# Patient Record
Sex: Female | Born: 1950 | Race: White | Hispanic: No | Smoking: Never smoker
Health system: Southern US, Community
[De-identification: ages and names within clinical notes are randomized; demographics above are authoritative.]

## PROBLEM LIST (undated history)

## (undated) DIAGNOSIS — I6350 Cerebral infarction due to unspecified occlusion or stenosis of unspecified cerebral artery: Secondary | ICD-10-CM

## (undated) DIAGNOSIS — H4920 Sixth [abducent] nerve palsy, unspecified eye: Secondary | ICD-10-CM

## (undated) DIAGNOSIS — F419 Anxiety disorder, unspecified: Secondary | ICD-10-CM

## (undated) DIAGNOSIS — K219 Gastro-esophageal reflux disease without esophagitis: Secondary | ICD-10-CM

## (undated) DIAGNOSIS — M1990 Unspecified osteoarthritis, unspecified site: Secondary | ICD-10-CM

## (undated) DIAGNOSIS — H409 Unspecified glaucoma: Secondary | ICD-10-CM

## (undated) DIAGNOSIS — IMO0002 Reserved for concepts with insufficient information to code with codable children: Secondary | ICD-10-CM

## (undated) DIAGNOSIS — Z8619 Personal history of other infectious and parasitic diseases: Secondary | ICD-10-CM

## (undated) DIAGNOSIS — I10 Essential (primary) hypertension: Secondary | ICD-10-CM

## (undated) DIAGNOSIS — F329 Major depressive disorder, single episode, unspecified: Secondary | ICD-10-CM

## (undated) DIAGNOSIS — F32A Depression, unspecified: Secondary | ICD-10-CM

## (undated) HISTORY — DX: Unspecified glaucoma: H40.9

## (undated) HISTORY — PX: WISDOM TOOTH EXTRACTION: SHX21

## (undated) HISTORY — PX: CHOLECYSTECTOMY: SHX55

## (undated) HISTORY — DX: Anxiety disorder, unspecified: F41.9

## (undated) HISTORY — PX: JOINT REPLACEMENT: SHX530

## (undated) HISTORY — PX: ABDOMINAL HYSTERECTOMY: SHX81

## (undated) HISTORY — PX: COLONOSCOPY: SHX174

---
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
## 1997-06-10 DIAGNOSIS — H4920 Sixth [abducent] nerve palsy, unspecified eye: Secondary | ICD-10-CM

## 1997-06-10 HISTORY — DX: Sixth (abducent) nerve palsy, unspecified eye: H49.20

## 2001-10-03 DIAGNOSIS — I639 Cerebral infarction, unspecified: Secondary | ICD-10-CM

## 2001-10-03 HISTORY — DX: Cerebral infarction, unspecified: I63.9

## 2004-10-06 ENCOUNTER — Ambulatory Visit: Payer: Self-pay | Admitting: Internal Medicine

## 2005-10-09 ENCOUNTER — Ambulatory Visit: Payer: Self-pay | Admitting: Internal Medicine

## 2005-10-11 ENCOUNTER — Ambulatory Visit: Payer: Self-pay | Admitting: Internal Medicine

## 2005-10-23 ENCOUNTER — Ambulatory Visit: Payer: Self-pay | Admitting: Gastroenterology

## 2005-11-03 HISTORY — PX: BREAST BIOPSY: SHX20

## 2006-07-12 ENCOUNTER — Ambulatory Visit: Payer: Self-pay | Admitting: Internal Medicine

## 2006-10-10 ENCOUNTER — Ambulatory Visit: Payer: Self-pay | Admitting: General Surgery

## 2007-02-25 ENCOUNTER — Ambulatory Visit: Payer: Self-pay | Admitting: Internal Medicine

## 2007-10-10 ENCOUNTER — Ambulatory Visit: Payer: Self-pay | Admitting: General Surgery

## 2007-10-21 ENCOUNTER — Ambulatory Visit: Payer: Self-pay | Admitting: Gastroenterology

## 2008-02-04 ENCOUNTER — Ambulatory Visit: Payer: Self-pay | Admitting: Internal Medicine

## 2008-02-12 ENCOUNTER — Ambulatory Visit: Payer: Self-pay | Admitting: Internal Medicine

## 2008-02-21 HISTORY — PX: KNEE ARTHROSCOPY: SUR90

## 2008-04-20 ENCOUNTER — Ambulatory Visit: Payer: Self-pay | Admitting: Urology

## 2008-11-02 ENCOUNTER — Ambulatory Visit: Payer: Self-pay | Admitting: Internal Medicine

## 2009-12-20 ENCOUNTER — Ambulatory Visit: Payer: Self-pay | Admitting: Internal Medicine

## 2011-02-13 ENCOUNTER — Ambulatory Visit: Payer: Self-pay

## 2012-05-08 ENCOUNTER — Ambulatory Visit: Payer: Self-pay | Admitting: Internal Medicine

## 2012-08-28 ENCOUNTER — Ambulatory Visit: Payer: Self-pay

## 2013-09-08 ENCOUNTER — Ambulatory Visit: Payer: Self-pay

## 2013-09-16 ENCOUNTER — Ambulatory Visit: Payer: Self-pay

## 2014-07-29 DIAGNOSIS — K649 Unspecified hemorrhoids: Secondary | ICD-10-CM | POA: Insufficient documentation

## 2014-07-29 DIAGNOSIS — F419 Anxiety disorder, unspecified: Secondary | ICD-10-CM | POA: Insufficient documentation

## 2014-07-31 ENCOUNTER — Ambulatory Visit: Payer: Self-pay | Admitting: Internal Medicine

## 2014-09-28 DIAGNOSIS — F5104 Psychophysiologic insomnia: Secondary | ICD-10-CM | POA: Insufficient documentation

## 2015-01-26 ENCOUNTER — Other Ambulatory Visit: Payer: Self-pay | Admitting: Internal Medicine

## 2015-01-26 DIAGNOSIS — Z1239 Encounter for other screening for malignant neoplasm of breast: Secondary | ICD-10-CM

## 2015-02-01 ENCOUNTER — Ambulatory Visit
Admission: RE | Admit: 2015-02-01 | Discharge: 2015-02-01 | Disposition: A | Discharge status: Home / Self Care | Payer: PPO | Source: Ambulatory Visit | Attending: Internal Medicine | Admitting: Internal Medicine

## 2015-02-01 DIAGNOSIS — Z1231 Encounter for screening mammogram for malignant neoplasm of breast: Secondary | ICD-10-CM | POA: Diagnosis not present

## 2015-02-01 DIAGNOSIS — Z1239 Encounter for other screening for malignant neoplasm of breast: Secondary | ICD-10-CM

## 2015-03-01 DIAGNOSIS — R0609 Other forms of dyspnea: Secondary | ICD-10-CM | POA: Insufficient documentation

## 2015-06-22 DIAGNOSIS — H401131 Primary open-angle glaucoma, bilateral, mild stage: Secondary | ICD-10-CM | POA: Diagnosis not present

## 2015-09-13 DIAGNOSIS — R1032 Left lower quadrant pain: Secondary | ICD-10-CM | POA: Diagnosis not present

## 2015-09-13 DIAGNOSIS — Z86010 Personal history of colonic polyps: Secondary | ICD-10-CM | POA: Diagnosis not present

## 2015-10-18 DIAGNOSIS — H40153 Residual stage of open-angle glaucoma, bilateral: Secondary | ICD-10-CM | POA: Diagnosis not present

## 2015-10-29 DIAGNOSIS — R05 Cough: Secondary | ICD-10-CM | POA: Diagnosis not present

## 2015-11-04 ENCOUNTER — Encounter: Payer: Self-pay | Admitting: *Deleted

## 2015-11-05 ENCOUNTER — Encounter
Admission: RE | Disposition: A | Discharge status: Home / Self Care | Payer: Self-pay | Source: Ambulatory Visit | Attending: Gastroenterology

## 2015-11-05 ENCOUNTER — Ambulatory Visit: Payer: PPO | Admitting: Anesthesiology

## 2015-11-05 ENCOUNTER — Ambulatory Visit
Admission: RE | Admit: 2015-11-05 | Discharge: 2015-11-05 | Disposition: A | Discharge status: Home / Self Care | Payer: PPO | Source: Ambulatory Visit | Attending: Gastroenterology | Admitting: Gastroenterology

## 2015-11-05 DIAGNOSIS — K621 Rectal polyp: Secondary | ICD-10-CM | POA: Diagnosis not present

## 2015-11-05 DIAGNOSIS — Z86010 Personal history of colonic polyps: Secondary | ICD-10-CM | POA: Diagnosis not present

## 2015-11-05 DIAGNOSIS — K5730 Diverticulosis of large intestine without perforation or abscess without bleeding: Secondary | ICD-10-CM | POA: Diagnosis not present

## 2015-11-05 DIAGNOSIS — F419 Anxiety disorder, unspecified: Secondary | ICD-10-CM | POA: Diagnosis not present

## 2015-11-05 DIAGNOSIS — D122 Benign neoplasm of ascending colon: Secondary | ICD-10-CM | POA: Diagnosis not present

## 2015-11-05 DIAGNOSIS — D123 Benign neoplasm of transverse colon: Secondary | ICD-10-CM | POA: Insufficient documentation

## 2015-11-05 DIAGNOSIS — D125 Benign neoplasm of sigmoid colon: Secondary | ICD-10-CM | POA: Diagnosis not present

## 2015-11-05 DIAGNOSIS — K635 Polyp of colon: Secondary | ICD-10-CM | POA: Diagnosis not present

## 2015-11-05 DIAGNOSIS — I10 Essential (primary) hypertension: Secondary | ICD-10-CM | POA: Insufficient documentation

## 2015-11-05 DIAGNOSIS — F329 Major depressive disorder, single episode, unspecified: Secondary | ICD-10-CM | POA: Insufficient documentation

## 2015-11-05 DIAGNOSIS — Z8673 Personal history of transient ischemic attack (TIA), and cerebral infarction without residual deficits: Secondary | ICD-10-CM | POA: Diagnosis not present

## 2015-11-05 DIAGNOSIS — K5790 Diverticulosis of intestine, part unspecified, without perforation or abscess without bleeding: Secondary | ICD-10-CM | POA: Diagnosis not present

## 2015-11-05 DIAGNOSIS — R1032 Left lower quadrant pain: Secondary | ICD-10-CM | POA: Diagnosis not present

## 2015-11-05 DIAGNOSIS — Z79899 Other long term (current) drug therapy: Secondary | ICD-10-CM | POA: Insufficient documentation

## 2015-11-05 DIAGNOSIS — D128 Benign neoplasm of rectum: Secondary | ICD-10-CM | POA: Diagnosis not present

## 2015-11-05 DIAGNOSIS — D124 Benign neoplasm of descending colon: Secondary | ICD-10-CM | POA: Diagnosis not present

## 2015-11-05 HISTORY — DX: Depression, unspecified: F32.A

## 2015-11-05 HISTORY — DX: Major depressive disorder, single episode, unspecified: F32.9

## 2015-11-05 HISTORY — PX: COLONOSCOPY WITH PROPOFOL: SHX5780

## 2015-11-05 HISTORY — DX: Reserved for concepts with insufficient information to code with codable children: IMO0002

## 2015-11-05 HISTORY — DX: Essential (primary) hypertension: I10

## 2015-11-05 HISTORY — DX: Sixth (abducent) nerve palsy, unspecified eye: H49.20

## 2015-11-05 HISTORY — DX: Cerebral infarction due to unspecified occlusion or stenosis of unspecified cerebral artery: I63.50

## 2015-11-05 HISTORY — DX: Personal history of other infectious and parasitic diseases: Z86.19

## 2015-11-05 SURGERY — COLONOSCOPY WITH PROPOFOL
Anesthesia: General

## 2015-11-05 MED ORDER — SODIUM CHLORIDE 0.9 % IV SOLN
INTRAVENOUS | Status: DC
  Administered 2015-11-05: 1000 mL via INTRAVENOUS

## 2015-11-05 MED ORDER — PROPOFOL 500 MG/50ML IV EMUL
INTRAVENOUS | Status: DC | PRN
  Administered 2015-11-05: 125 ug/kg/min via INTRAVENOUS

## 2015-11-05 MED ORDER — SODIUM CHLORIDE 0.9 % IV SOLN
INTRAVENOUS | Status: DC
  Administered 2015-11-05: 11:00:00 via INTRAVENOUS

## 2015-11-05 MED ORDER — MIDAZOLAM HCL 2 MG/2ML IJ SOLN
INTRAMUSCULAR | Status: DC | PRN
  Administered 2015-11-05: 1 mg via INTRAVENOUS

## 2015-11-05 MED ORDER — PROPOFOL 10 MG/ML IV BOLUS
INTRAVENOUS | Status: DC | PRN
  Administered 2015-11-05: 50 mg via INTRAVENOUS

## 2015-11-05 MED ORDER — FENTANYL CITRATE (PF) 100 MCG/2ML IJ SOLN
INTRAMUSCULAR | Status: DC | PRN
  Administered 2015-11-05: 50 ug via INTRAVENOUS

## 2015-11-05 NOTE — Anesthesia Procedure Notes (Signed)
Date/Time: 11/05/2015 10:47 AM Performed by: Nelda Marseille Pre-anesthesia Checklist: Patient identified, Emergency Drugs available, Suction available, Patient being monitored and Timeout performed Oxygen Delivery Method: Nasal cannula

## 2015-11-05 NOTE — Anesthesia Preprocedure Evaluation (Signed)
Anesthesia Evaluation  Patient identified by MRN, date of birth, ID band Patient awake    Reviewed: Allergy & Precautions, NPO status , Patient's Chart, lab work & pertinent test results, reviewed documented beta blocker date and time   Airway Mallampati: II  TM Distance: >3 FB     Dental  (+) Chipped   Pulmonary           Cardiovascular hypertension, Pt. on medications      Neuro/Psych PSYCHIATRIC DISORDERS Depression TIA Neuromuscular disease    GI/Hepatic   Endo/Other    Renal/GU      Musculoskeletal   Abdominal   Peds  Hematology   Anesthesia Other Findings Denies stroke. Has had a TIA in 2003.  Reproductive/Obstetrics                             Anesthesia Physical Anesthesia Plan  ASA: III  Anesthesia Plan: General   Post-op Pain Management:    Induction: Intravenous  Airway Management Planned: Nasal Cannula  Additional Equipment:   Intra-op Plan:   Post-operative Plan:   Informed Consent: I have reviewed the patients History and Physical, chart, labs and discussed the procedure including the risks, benefits and alternatives for the proposed anesthesia with the patient or authorized representative who has indicated his/her understanding and acceptance.     Plan Discussed with: CRNA  Anesthesia Plan Comments:         Anesthesia Quick Evaluation

## 2015-11-05 NOTE — Anesthesia Postprocedure Evaluation (Signed)
Anesthesia Post Note  Patient: Patricia Richardson  Procedure(s) Performed: Procedure(s) (LRB): COLONOSCOPY WITH PROPOFOL (N/A)  Patient location during evaluation: Endoscopy Anesthesia Type: General Level of consciousness: awake and alert Pain management: pain level controlled Vital Signs Assessment: post-procedure vital signs reviewed and stable Respiratory status: spontaneous breathing, nonlabored ventilation, respiratory function stable and patient connected to nasal cannula oxygen Cardiovascular status: blood pressure returned to baseline and stable Postop Assessment: no signs of nausea or vomiting Anesthetic complications: no    Last Vitals:  Filed Vitals:   11/05/15 1135 11/05/15 1145  BP: 140/81 149/81  Pulse: 72 69  Temp:    Resp: 15 23    Last Pain: There were no vitals filed for this visit.               Jaquayla Hege S

## 2015-11-05 NOTE — Transfer of Care (Signed)
Immediate Anesthesia Transfer of Care Note  Patient: Patricia Richardson  Procedure(s) Performed: Procedure(s): COLONOSCOPY WITH PROPOFOL (N/A)  Patient Location: PACU  Anesthesia Type:General  Level of Consciousness: sedated  Airway & Oxygen Therapy: Patient Spontanous Breathing and Patient connected to nasal cannula oxygen  Post-op Assessment: Report given to RN and Post -op Vital signs reviewed and stable  Post vital signs: Reviewed and stable  Last Vitals:  Filed Vitals:   11/05/15 1009 11/05/15 1115  BP: 142/64 91/73  Pulse: 76 73  Temp: 36.3 C 35.6 C  Resp: 18 13    Last Pain: There were no vitals filed for this visit.       Complications: No apparent anesthesia complications

## 2015-11-05 NOTE — H&P (Signed)
Outpatient short stay form Pre-procedure 11/05/2015 10:20 AM Lollie Sails MD  Primary Physician: Dr. Glendon Axe  Reason for visit:  Colonoscopy  History of present illness:  Patient is a 64 year old female presenting day as above. She has a personal history of colon polyps. She also has a history of chronic left lower quadrant abdominal discomfort she notices some occasional bright red blood on toilet paper. She has had CT evaluation is uninformative. Her last colonoscopy was 10/21/2007 with finding of internal small hemorrhoids and diverticulosis.  She tolerated her prep well. She takes no recent aspirin products or prescribe blood thinners.    Current facility-administered medications:  .  0.9 %  sodium chloride infusion, , Intravenous, Continuous, Lollie Sails, MD .  0.9 %  sodium chloride infusion, , Intravenous, Continuous, Lollie Sails, MD  Prescriptions prior to admission  Medication Sig Dispense Refill Last Dose  . ALPRAZolam (XANAX) 0.25 MG tablet Take 0.25 mg by mouth at bedtime as needed for anxiety.     . busPIRone (BUSPAR) 5 MG tablet Take 5 mg by mouth 2 (two) times daily.     Marland Kitchen ibuprofen (ADVIL,MOTRIN) 200 MG tablet Take 200 mg by mouth every 6 (six) hours as needed.     . latanoprost (XALATAN) 0.005 % ophthalmic solution 1 drop at bedtime.     Marland Kitchen oxycodone-acetaminophen (MAGNECET) 10-400 MG tablet Take 1 tablet by mouth every 4 (four) hours as needed for pain.     . tizanidine (ZANAFLEX) 2 MG capsule Take 2 mg by mouth at bedtime as needed for muscle spasms.     . vitamin B-12 (CYANOCOBALAMIN) 100 MCG tablet Take 100 mcg by mouth daily.        Allergies  Allergen Reactions  . Codeine      Past Medical History  Diagnosis Date  . 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 Cystocele   . Depression   . History of chicken pox   . Hypertension   . 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    HEENT: Normocephalic atraumatic eyes are anicteric    Other:     Pertinant exam for procedure: Soft nontender mild discomfort palpation the lateral left lower quadrant. Bowel sounds positive normoactive there is no apparent organomegaly or masses felt. There is no rebound.    Planned proceedures: Colonoscopy and indicated procedures. I have discussed the risks benefits and complications of procedures to include not limited to bleeding, infection, perforation and the risk of sedation and the patient wishes to proceed.    Lollie Sails, MD Gastroenterology 11/05/2015  10:20 AM

## 2015-11-05 NOTE — Op Note (Signed)
[facility] Gastroenterology Patient Name: Patricia Richardson Procedure Date: 11/05/2015 10:32 AM MRN: [id number] Account #: [id number] Date of Birth: [birth year] Admit Type: Outpatient Age: 65 Room: [facility] ENDO ROOM 3 Gender: Female Note Status: Finalized Procedure:            Colonoscopy Indications:          Abdominal pain in the left lower quadrant, Personal                        history of colonic polyps Providers:            Lollie Sails, MD Referring MD:         Glendon Axe (Referring MD) Medicines:            Monitored Anesthesia Care Complications:        No immediate complications. Procedure:            Pre-Anesthesia Assessment:                       - ASA Grade Assessment: III - A patient with severe                        systemic disease.                       After obtaining informed consent, the colonoscope was                        passed under direct vision. Throughout the procedure,                        the patient's blood pressure, pulse, and oxygen                        saturations were monitored continuously. The                        Colonoscope was introduced through the anus and                        advanced to the the cecum, identified by appendiceal                        orifice and ileocecal valve. The colonoscopy was                        performed with moderate difficulty due to a tortuous                        colon. The patient tolerated the procedure well. The                        quality of the bowel preparation was good. Findings:      Two sessile polyps were found in the proximal sigmoid colon. The polyps       were 1 to 3 mm in size. These polyps were removed with a cold biopsy       forceps. Resection and retrieval were complete.      A 5 mm polyp was found in the proximal descending colon. The polyp was       sessile.  The polyp was removed with a cold snare. Resection and       retrieval were complete.      A 4 mm  polyp was found in the transverse colon. The polyp was sessile.       The polyp was removed with a cold snare. Resection and retrieval were       complete.      A 3 mm polyp was found in the mid ascending colon. The polyp was       sessile. The polyp was removed with a cold snare. Resection and       retrieval were complete.      A 3 mm polyp was found in the rectum. The polyp was sessile. The polyp       was removed with a cold biopsy forceps. Resection and retrieval were       complete.      Multiple small to medium diverticula were found in the sigmoid colon,       descending colon, splenic flexure, transverse colon, hepatic flexure and       ascending colon.      The digital rectal exam was normal. Impression:           - Two 1 to 3 mm polyps in the proximal sigmoid colon,                        removed with a cold biopsy forceps. Resected and                        retrieved.                       - One 5 mm polyp in the proximal descending colon,                        removed with a cold snare. Resected and retrieved.                       - One 4 mm polyp in the transverse colon, removed with                        a cold snare. Resected and retrieved.                       - One 3 mm polyp in the mid ascending colon, removed                        with a cold snare. Resected and retrieved.                       - One 3 mm polyp in the rectum, removed with a cold                        biopsy forceps. Resected and retrieved.                       - Diverticulosis in the sigmoid colon, in the                        descending colon, at the splenic flexure, in the  transverse colon, at the hepatic flexure and in the                        ascending colon. Recommendation:       - Discharge patient to home.                       - Await pathology results.                       - Telephone GI clinic for pathology results in 1 week. Procedure Code(s):    ---  Professional ---                       [phone], Colonoscopy, flexible; with removal of tumor(s),                        polyp(s), or other lesion(s) by snare technique                       [id number], 62, Colonoscopy, flexible; with biopsy, single                        or multiple Diagnosis Code(s):    --- Professional ---                       D12.5, Benign neoplasm of sigmoid colon                       D12.4, Benign neoplasm of descending colon                       D12.3, Benign neoplasm of transverse colon (hepatic                        flexure or splenic flexure)                       D12.2, Benign neoplasm of ascending colon                       K62.1, Rectal polyp                       R10.32, Left lower quadrant pain                       Z86.010, Personal history of colonic polyps                       K57.30, Diverticulosis of large intestine without                        perforation or abscess without bleeding CPT copyright 2016 American Medical Association. All rights reserved. The codes documented in this report are preliminary and upon coder review may  be revised to meet current compliance requirements. Lollie Sails, MD 11/05/2015 11:13:55 AM This report has been signed electronically. Number of Addenda: 0 Note Initiated On: 11/05/2015 10:32 AM Scope Withdrawal Time: 0 hours 8 minutes 15 seconds  Total Procedure Duration: 0 hours 25 minutes 26 seconds       [facility]

## 2015-11-08 ENCOUNTER — Encounter: Payer: Self-pay | Admitting: Gastroenterology

## 2015-11-08 LAB — SURGICAL PATHOLOGY

## 2016-01-19 ENCOUNTER — Other Ambulatory Visit: Payer: Self-pay | Admitting: Internal Medicine

## 2016-01-19 DIAGNOSIS — Z1231 Encounter for screening mammogram for malignant neoplasm of breast: Secondary | ICD-10-CM

## 2016-02-21 DIAGNOSIS — H40153 Residual stage of open-angle glaucoma, bilateral: Secondary | ICD-10-CM | POA: Diagnosis not present

## 2016-02-23 DIAGNOSIS — M7061 Trochanteric bursitis, right hip: Secondary | ICD-10-CM | POA: Diagnosis not present

## 2016-02-23 DIAGNOSIS — M25551 Pain in right hip: Secondary | ICD-10-CM | POA: Diagnosis not present

## 2016-02-23 DIAGNOSIS — M1611 Unilateral primary osteoarthritis, right hip: Secondary | ICD-10-CM | POA: Diagnosis not present

## 2016-02-28 ENCOUNTER — Other Ambulatory Visit: Payer: Self-pay | Admitting: Internal Medicine

## 2016-02-28 ENCOUNTER — Ambulatory Visit
Admission: RE | Admit: 2016-02-28 | Discharge: 2016-02-28 | Disposition: A | Discharge status: Home / Self Care | Payer: PPO | Source: Ambulatory Visit | Attending: Internal Medicine | Admitting: Internal Medicine

## 2016-02-28 DIAGNOSIS — Z0000 Encounter for general adult medical examination without abnormal findings: Secondary | ICD-10-CM | POA: Diagnosis not present

## 2016-02-28 DIAGNOSIS — I10 Essential (primary) hypertension: Secondary | ICD-10-CM | POA: Diagnosis not present

## 2016-02-28 DIAGNOSIS — Z1231 Encounter for screening mammogram for malignant neoplasm of breast: Secondary | ICD-10-CM | POA: Diagnosis not present

## 2016-02-29 DIAGNOSIS — N390 Urinary tract infection, site not specified: Secondary | ICD-10-CM | POA: Diagnosis not present

## 2016-02-29 DIAGNOSIS — R350 Frequency of micturition: Secondary | ICD-10-CM | POA: Diagnosis not present

## 2016-03-06 DIAGNOSIS — D1720 Benign lipomatous neoplasm of skin and subcutaneous tissue of unspecified limb: Secondary | ICD-10-CM | POA: Diagnosis not present

## 2016-03-06 DIAGNOSIS — Z0000 Encounter for general adult medical examination without abnormal findings: Secondary | ICD-10-CM | POA: Diagnosis not present

## 2016-03-06 DIAGNOSIS — Z23 Encounter for immunization: Secondary | ICD-10-CM | POA: Diagnosis not present

## 2016-03-06 DIAGNOSIS — F419 Anxiety disorder, unspecified: Secondary | ICD-10-CM | POA: Diagnosis not present

## 2016-03-10 DIAGNOSIS — M25511 Pain in right shoulder: Secondary | ICD-10-CM | POA: Diagnosis not present

## 2016-05-04 DIAGNOSIS — M1611 Unilateral primary osteoarthritis, right hip: Secondary | ICD-10-CM | POA: Diagnosis not present

## 2016-05-11 DIAGNOSIS — M1611 Unilateral primary osteoarthritis, right hip: Secondary | ICD-10-CM | POA: Diagnosis not present

## 2016-06-28 DIAGNOSIS — R05 Cough: Secondary | ICD-10-CM | POA: Diagnosis not present

## 2016-06-28 DIAGNOSIS — J069 Acute upper respiratory infection, unspecified: Secondary | ICD-10-CM | POA: Diagnosis not present

## 2016-08-01 DIAGNOSIS — H40153 Residual stage of open-angle glaucoma, bilateral: Secondary | ICD-10-CM | POA: Diagnosis not present

## 2017-01-10 DIAGNOSIS — J209 Acute bronchitis, unspecified: Secondary | ICD-10-CM | POA: Diagnosis not present

## 2017-01-10 DIAGNOSIS — B9689 Other specified bacterial agents as the cause of diseases classified elsewhere: Secondary | ICD-10-CM | POA: Diagnosis not present

## 2017-01-10 DIAGNOSIS — J019 Acute sinusitis, unspecified: Secondary | ICD-10-CM | POA: Diagnosis not present

## 2017-01-10 DIAGNOSIS — R1013 Epigastric pain: Secondary | ICD-10-CM | POA: Diagnosis not present

## 2017-01-23 ENCOUNTER — Other Ambulatory Visit: Payer: Self-pay | Admitting: Internal Medicine

## 2017-01-23 DIAGNOSIS — Z1231 Encounter for screening mammogram for malignant neoplasm of breast: Secondary | ICD-10-CM

## 2017-02-13 DIAGNOSIS — H40153 Residual stage of open-angle glaucoma, bilateral: Secondary | ICD-10-CM | POA: Diagnosis not present

## 2017-03-01 ENCOUNTER — Ambulatory Visit
Admission: RE | Admit: 2017-03-01 | Discharge: 2017-03-01 | Disposition: A | Discharge status: Home / Self Care | Payer: PPO | Source: Ambulatory Visit | Attending: Internal Medicine | Admitting: Internal Medicine

## 2017-03-01 DIAGNOSIS — Z1231 Encounter for screening mammogram for malignant neoplasm of breast: Secondary | ICD-10-CM | POA: Insufficient documentation

## 2017-03-05 DIAGNOSIS — Z0000 Encounter for general adult medical examination without abnormal findings: Secondary | ICD-10-CM | POA: Diagnosis not present

## 2017-03-05 DIAGNOSIS — I10 Essential (primary) hypertension: Secondary | ICD-10-CM | POA: Diagnosis not present

## 2017-03-05 DIAGNOSIS — R0609 Other forms of dyspnea: Secondary | ICD-10-CM | POA: Diagnosis not present

## 2017-03-05 DIAGNOSIS — F5104 Psychophysiologic insomnia: Secondary | ICD-10-CM | POA: Diagnosis not present

## 2017-03-05 LAB — LIPID PANEL
CHOLESTEROL: 179 (ref 0–200)
HDL: 61 (ref 35–70)
LDL CALC: 99
Triglycerides: 99 (ref 40–160)

## 2017-03-06 DIAGNOSIS — Z0000 Encounter for general adult medical examination without abnormal findings: Secondary | ICD-10-CM | POA: Diagnosis not present

## 2017-03-06 DIAGNOSIS — I10 Essential (primary) hypertension: Secondary | ICD-10-CM | POA: Diagnosis not present

## 2017-03-06 DIAGNOSIS — F5104 Psychophysiologic insomnia: Secondary | ICD-10-CM | POA: Diagnosis not present

## 2017-03-06 DIAGNOSIS — R0609 Other forms of dyspnea: Secondary | ICD-10-CM | POA: Diagnosis not present

## 2017-03-12 DIAGNOSIS — Z0000 Encounter for general adult medical examination without abnormal findings: Secondary | ICD-10-CM | POA: Diagnosis not present

## 2017-03-12 DIAGNOSIS — M1611 Unilateral primary osteoarthritis, right hip: Secondary | ICD-10-CM | POA: Diagnosis not present

## 2017-03-12 DIAGNOSIS — Z23 Encounter for immunization: Secondary | ICD-10-CM | POA: Diagnosis not present

## 2017-03-12 DIAGNOSIS — I10 Essential (primary) hypertension: Secondary | ICD-10-CM | POA: Diagnosis not present

## 2017-05-10 DIAGNOSIS — M1611 Unilateral primary osteoarthritis, right hip: Secondary | ICD-10-CM | POA: Diagnosis not present

## 2017-05-11 DIAGNOSIS — M1611 Unilateral primary osteoarthritis, right hip: Secondary | ICD-10-CM | POA: Diagnosis not present

## 2017-07-09 DIAGNOSIS — H40153 Residual stage of open-angle glaucoma, bilateral: Secondary | ICD-10-CM | POA: Diagnosis not present

## 2017-11-05 DIAGNOSIS — H40153 Residual stage of open-angle glaucoma, bilateral: Secondary | ICD-10-CM | POA: Diagnosis not present

## 2017-11-19 DIAGNOSIS — L538 Other specified erythematous conditions: Secondary | ICD-10-CM | POA: Diagnosis not present

## 2017-11-19 DIAGNOSIS — D2271 Melanocytic nevi of right lower limb, including hip: Secondary | ICD-10-CM | POA: Diagnosis not present

## 2017-11-19 DIAGNOSIS — D171 Benign lipomatous neoplasm of skin and subcutaneous tissue of trunk: Secondary | ICD-10-CM | POA: Diagnosis not present

## 2017-11-19 DIAGNOSIS — D2261 Melanocytic nevi of right upper limb, including shoulder: Secondary | ICD-10-CM | POA: Diagnosis not present

## 2017-11-19 DIAGNOSIS — D485 Neoplasm of uncertain behavior of skin: Secondary | ICD-10-CM | POA: Diagnosis not present

## 2017-11-19 DIAGNOSIS — D2272 Melanocytic nevi of left lower limb, including hip: Secondary | ICD-10-CM | POA: Diagnosis not present

## 2017-11-19 DIAGNOSIS — D225 Melanocytic nevi of trunk: Secondary | ICD-10-CM | POA: Diagnosis not present

## 2017-12-17 DIAGNOSIS — D485 Neoplasm of uncertain behavior of skin: Secondary | ICD-10-CM | POA: Diagnosis not present

## 2018-01-28 DIAGNOSIS — M1611 Unilateral primary osteoarthritis, right hip: Secondary | ICD-10-CM | POA: Diagnosis not present

## 2018-01-30 ENCOUNTER — Other Ambulatory Visit: Payer: Self-pay | Admitting: Internal Medicine

## 2018-01-30 DIAGNOSIS — Z1231 Encounter for screening mammogram for malignant neoplasm of breast: Secondary | ICD-10-CM

## 2018-02-20 DIAGNOSIS — J209 Acute bronchitis, unspecified: Secondary | ICD-10-CM | POA: Diagnosis not present

## 2018-02-20 DIAGNOSIS — R1013 Epigastric pain: Secondary | ICD-10-CM | POA: Diagnosis not present

## 2018-02-20 DIAGNOSIS — J019 Acute sinusitis, unspecified: Secondary | ICD-10-CM | POA: Diagnosis not present

## 2018-02-20 DIAGNOSIS — B373 Candidiasis of vulva and vagina: Secondary | ICD-10-CM | POA: Diagnosis not present

## 2018-02-20 DIAGNOSIS — B9689 Other specified bacterial agents as the cause of diseases classified elsewhere: Secondary | ICD-10-CM | POA: Diagnosis not present

## 2018-03-05 ENCOUNTER — Ambulatory Visit
Admission: RE | Admit: 2018-03-05 | Discharge: 2018-03-05 | Disposition: A | Discharge status: Home / Self Care | Payer: PPO | Source: Ambulatory Visit | Attending: Internal Medicine | Admitting: Internal Medicine

## 2018-03-05 DIAGNOSIS — Z1231 Encounter for screening mammogram for malignant neoplasm of breast: Secondary | ICD-10-CM | POA: Diagnosis not present

## 2018-03-19 DIAGNOSIS — H40153 Residual stage of open-angle glaucoma, bilateral: Secondary | ICD-10-CM | POA: Diagnosis not present

## 2018-03-27 ENCOUNTER — Other Ambulatory Visit: Payer: PPO

## 2018-03-29 ENCOUNTER — Other Ambulatory Visit: Payer: Self-pay

## 2018-03-29 ENCOUNTER — Encounter
Admission: RE | Admit: 2018-03-29 | Discharge: 2018-03-29 | Disposition: A | Discharge status: Home / Self Care | Payer: PPO | Source: Ambulatory Visit | Attending: Orthopedic Surgery | Admitting: Orthopedic Surgery

## 2018-03-29 DIAGNOSIS — M1611 Unilateral primary osteoarthritis, right hip: Secondary | ICD-10-CM | POA: Diagnosis not present

## 2018-03-29 DIAGNOSIS — Z01818 Encounter for other preprocedural examination: Secondary | ICD-10-CM | POA: Insufficient documentation

## 2018-03-29 DIAGNOSIS — G459 Transient cerebral ischemic attack, unspecified: Secondary | ICD-10-CM | POA: Insufficient documentation

## 2018-03-29 DIAGNOSIS — I10 Essential (primary) hypertension: Secondary | ICD-10-CM | POA: Diagnosis not present

## 2018-03-29 HISTORY — DX: Unspecified osteoarthritis, unspecified site: M19.90

## 2018-03-29 HISTORY — DX: Gastro-esophageal reflux disease without esophagitis: K21.9

## 2018-03-29 LAB — BASIC METABOLIC PANEL
Anion gap: 7 (ref 5–15)
BUN: 13 mg/dL (ref 8–23)
CALCIUM: 9.2 mg/dL (ref 8.9–10.3)
CO2: 25 mmol/L (ref 22–32)
CREATININE: 0.68 mg/dL (ref 0.44–1.00)
Chloride: 109 mmol/L (ref 98–111)
Glucose, Bld: 97 mg/dL (ref 70–99)
Potassium: 4.2 mmol/L (ref 3.5–5.1)
SODIUM: 141 mmol/L (ref 135–145)

## 2018-03-29 LAB — CBC
HCT: 42.2 % (ref 36.0–46.0)
Hemoglobin: 14 g/dL (ref 12.0–15.0)
MCH: 30.3 pg (ref 26.0–34.0)
MCHC: 33.2 g/dL (ref 30.0–36.0)
MCV: 91.3 fL (ref 80.0–100.0)
NRBC: 0 % (ref 0.0–0.2)
PLATELETS: 329 10*3/uL (ref 150–400)
RBC: 4.62 MIL/uL (ref 3.87–5.11)
RDW: 13.3 % (ref 11.5–15.5)
WBC: 6.7 10*3/uL (ref 4.0–10.5)

## 2018-03-29 LAB — TYPE AND SCREEN
ABO/RH(D): A POS
Antibody Screen: NEGATIVE

## 2018-03-29 LAB — URINALYSIS, COMPLETE (UACMP) WITH MICROSCOPIC
BILIRUBIN URINE: NEGATIVE
GLUCOSE, UA: NEGATIVE mg/dL
Hgb urine dipstick: NEGATIVE
Ketones, ur: NEGATIVE mg/dL
Leukocytes, UA: NEGATIVE
NITRITE: NEGATIVE
PH: 6 (ref 5.0–8.0)
Protein, ur: NEGATIVE mg/dL
SPECIFIC GRAVITY, URINE: 1.003 — AB (ref 1.005–1.030)

## 2018-03-29 LAB — SURGICAL PCR SCREEN
MRSA, PCR: NEGATIVE
STAPHYLOCOCCUS AUREUS: NEGATIVE

## 2018-03-29 LAB — SEDIMENTATION RATE: SED RATE: 15 mm/h (ref 0–30)

## 2018-03-29 NOTE — Pre-Procedure Instructions (Signed)
PT/PTT recollection ordered for DOS per lab.

## 2018-03-29 NOTE — Patient Instructions (Signed)
Your procedure is scheduled on: 04/09/18 Tues Report to Same Day Surgery 2nd floor medical mall [facility] Entrance-take elevator on left to 2nd floor.  Check in with surgery information desk.) To find out your arrival time please call [phone] between 1PM - 3PM on 04/08/18 Mon  Remember: Instructions that are not followed completely may result in serious medical risk, up to and including death, or upon the discretion of your surgeon and anesthesiologist your surgery may need to be rescheduled.    _x___ 1. Do not eat food after midnight the night before your procedure. You may drink clear liquids up to 2 hours before you are scheduled to arrive at the hospital for your procedure.  Do not drink clear liquids within 2 hours of your scheduled arrival to the hospital.  Clear liquids include  --Water or Apple juice without pulp  --Clear carbohydrate beverage such as ClearFast or Gatorade  --Black Coffee or Clear Tea (No milk, no creamers, do not add anything to                  the coffee or Tea Type 1 and type 2 diabetics should only drink water.   ____Ensure clear carbohydrate drink on the way to the hospital for bariatric patients  ____Ensure clear carbohydrate drink 3 hours before surgery for Dr Dwyane Luo patients if physician instructed.   No gum chewing or hard candies.     __x__ 2. No Alcohol for 24 hours before or after surgery.   __x__3. No Smoking or e-cigarettes for 24 prior to surgery.  Do not use any chewable tobacco products for at least 6 hour prior to surgery   ____  4. Bring all medications with you on the day of surgery if instructed.    __x__ 5. Notify your doctor if there is any change in your medical condition     (cold, fever, infections).    x___6. On the morning of surgery brush your teeth with toothpaste and water.  You may rinse your mouth with mouth wash if you wish.  Do not swallow any toothpaste or mouthwash.   Do not wear jewelry, make-up, hairpins,  clips or nail polish.  Do not wear lotions, powders, or perfumes. You may wear deodorant.  Do not shave 48 hours prior to surgery. Men may shave face and neck.  Do not bring valuables to the hospital.    [facility] is not responsible for any belongings or valuables.               Contacts, dentures or bridgework may not be worn into surgery.  Leave your suitcase in the car. After surgery it may be brought to your room.  For patients admitted to the hospital, discharge time is determined by your                       treatment team.  _  Patients discharged the day of surgery will not be allowed to drive home.  You will need someone to drive you home and stay with you the night of your procedure.    Please read over the following fact sheets that you were given:   [facility] Preparing for Surgery and or MRSA Information   _x___ Take anti-hypertensive listed below, cardiac, seizure, asthma,     anti-reflux and psychiatric medicines. These include:  1. omeprazole (PRILOSEC) 20 MG capsule  2.  3.  4.  5.  6.  ____Fleets enema or  Magnesium Citrate as directed.   _x___ Use CHG Soap or sage wipes as directed on instruction sheet   ____ Use inhalers on the day of surgery and bring to hospital day of surgery  ____ Stop Metformin and Janumet 2 days prior to surgery.    ____ Take 1/2 of usual insulin dose the night before surgery and none on the morning     surgery.   _x___ Follow recommendations from Cardiologist, Pulmonologist or PCP regarding          stopping Aspirin, Coumadin, Plavix ,Eliquis, Effient, or Pradaxa, and Pletal.  X____Stop Anti-inflammatories such as Advil, Aleve, Ibuprofen, Motrin, Naproxen, Naprosyn, Goodies powders or aspirin products. OK to take Tylenol and                          Celebrex.   _x___ Stop supplements until after surgery.  But may continue Vitamin D, Vitamin B,       and multivitamin.   ____ Bring C-Pap to the hospital.

## 2018-04-01 LAB — URINE CULTURE: Culture: >=100000 — AB

## 2018-04-01 NOTE — Pre-Procedure Instructions (Signed)
UC FAXED TO DR MENZ 

## 2018-04-08 MED ORDER — CEFAZOLIN SODIUM-DEXTROSE 2-4 GM/100ML-% IV SOLN
2.0000 g | Freq: Once | INTRAVENOUS | Status: AC
  Administered 2018-04-09: 2 g via INTRAVENOUS

## 2018-04-09 ENCOUNTER — Encounter
Admission: RE | Disposition: A | Discharge status: Home Health | Payer: Self-pay | Source: Home / Self Care | Attending: Orthopedic Surgery

## 2018-04-09 ENCOUNTER — Inpatient Hospital Stay: Payer: PPO

## 2018-04-09 ENCOUNTER — Inpatient Hospital Stay: Payer: PPO | Admitting: Anesthesiology

## 2018-04-09 ENCOUNTER — Inpatient Hospital Stay
Admission: RE | Admit: 2018-04-09 | Discharge: 2018-04-11 | DRG: 470 | Disposition: A | Discharge status: Home Health | Payer: PPO | Attending: Orthopedic Surgery | Admitting: Orthopedic Surgery

## 2018-04-09 ENCOUNTER — Other Ambulatory Visit: Payer: Self-pay

## 2018-04-09 DIAGNOSIS — K219 Gastro-esophageal reflux disease without esophagitis: Secondary | ICD-10-CM | POA: Diagnosis present

## 2018-04-09 DIAGNOSIS — Z8673 Personal history of transient ischemic attack (TIA), and cerebral infarction without residual deficits: Secondary | ICD-10-CM

## 2018-04-09 DIAGNOSIS — Z471 Aftercare following joint replacement surgery: Secondary | ICD-10-CM | POA: Diagnosis not present

## 2018-04-09 DIAGNOSIS — Z8249 Family history of ischemic heart disease and other diseases of the circulatory system: Secondary | ICD-10-CM

## 2018-04-09 DIAGNOSIS — Z419 Encounter for procedure for purposes other than remedying health state, unspecified: Secondary | ICD-10-CM

## 2018-04-09 DIAGNOSIS — Z96641 Presence of right artificial hip joint: Secondary | ICD-10-CM | POA: Diagnosis not present

## 2018-04-09 DIAGNOSIS — Z841 Family history of disorders of kidney and ureter: Secondary | ICD-10-CM | POA: Diagnosis not present

## 2018-04-09 DIAGNOSIS — I10 Essential (primary) hypertension: Secondary | ICD-10-CM | POA: Diagnosis not present

## 2018-04-09 DIAGNOSIS — Z23 Encounter for immunization: Secondary | ICD-10-CM

## 2018-04-09 DIAGNOSIS — G8918 Other acute postprocedural pain: Secondary | ICD-10-CM

## 2018-04-09 DIAGNOSIS — M1611 Unilateral primary osteoarthritis, right hip: Secondary | ICD-10-CM | POA: Diagnosis not present

## 2018-04-09 HISTORY — PX: TOTAL HIP ARTHROPLASTY: SHX124

## 2018-04-09 LAB — PROTIME-INR
INR: 0.93
PROTHROMBIN TIME: 12.4 s (ref 11.4–15.2)

## 2018-04-09 LAB — ABO/RH: ABO/RH(D): A POS

## 2018-04-09 LAB — APTT: APTT: 26 s (ref 24–36)

## 2018-04-09 LAB — GLUCOSE, CAPILLARY: Glucose-Capillary: 138 mg/dL — ABNORMAL HIGH (ref 70–99)

## 2018-04-09 SURGERY — ARTHROPLASTY, HIP, TOTAL, ANTERIOR APPROACH
Anesthesia: Choice | Site: Hip | Laterality: Right

## 2018-04-09 MED ORDER — PROPOFOL 500 MG/50ML IV EMUL
INTRAVENOUS | Status: AC
  Filled 2018-04-09: qty 50

## 2018-04-09 MED ORDER — ONDANSETRON HCL 4 MG/2ML IJ SOLN
INTRAMUSCULAR | Status: DC | PRN
  Administered 2018-04-09: 4 mg via INTRAVENOUS

## 2018-04-09 MED ORDER — PANTOPRAZOLE SODIUM 40 MG PO TBEC
80.0000 mg | DELAYED_RELEASE_TABLET | Freq: Every day | ORAL | Status: DC
  Administered 2018-04-10 – 2018-04-11 (×2): 80 mg via ORAL
  Filled 2018-04-09 (×2): qty 2

## 2018-04-09 MED ORDER — ONDANSETRON HCL 4 MG/2ML IJ SOLN: 4.0000 mg | Freq: Four times a day (QID) | INTRAMUSCULAR | Status: DC | PRN

## 2018-04-09 MED ORDER — BISACODYL 5 MG PO TBEC
5.0000 mg | DELAYED_RELEASE_TABLET | Freq: Every day | ORAL | Status: DC | PRN
  Administered 2018-04-11: 5 mg via ORAL
  Filled 2018-04-09: qty 1

## 2018-04-09 MED ORDER — ASPIRIN 81 MG PO CHEW
81.0000 mg | CHEWABLE_TABLET | Freq: Two times a day (BID) | ORAL | Status: DC
  Administered 2018-04-09 – 2018-04-11 (×4): 81 mg via ORAL
  Filled 2018-04-09 (×4): qty 1

## 2018-04-09 MED ORDER — METOCLOPRAMIDE HCL 5 MG/ML IJ SOLN: 5.0000 mg | Freq: Three times a day (TID) | INTRAMUSCULAR | Status: DC | PRN

## 2018-04-09 MED ORDER — CEFAZOLIN SODIUM-DEXTROSE 2-4 GM/100ML-% IV SOLN
2.0000 g | Freq: Four times a day (QID) | INTRAVENOUS | Status: AC
  Administered 2018-04-09 – 2018-04-10 (×2): 2 g via INTRAVENOUS
  Filled 2018-04-09 (×2): qty 100

## 2018-04-09 MED ORDER — PHENOL 1.4 % MT LIQD
1.0000 | OROMUCOSAL | Status: DC | PRN
  Filled 2018-04-09: qty 177

## 2018-04-09 MED ORDER — MENTHOL 3 MG MT LOZG
1.0000 | LOZENGE | OROMUCOSAL | Status: DC | PRN
  Filled 2018-04-09: qty 9

## 2018-04-09 MED ORDER — GABAPENTIN 300 MG PO CAPS
300.0000 mg | ORAL_CAPSULE | Freq: Three times a day (TID) | ORAL | Status: DC
  Administered 2018-04-09 – 2018-04-11 (×5): 300 mg via ORAL
  Filled 2018-04-09 (×5): qty 1

## 2018-04-09 MED ORDER — MEPERIDINE HCL 50 MG/ML IJ SOLN
INTRAMUSCULAR | Status: AC
  Filled 2018-04-09: qty 1

## 2018-04-09 MED ORDER — FENTANYL CITRATE (PF) 100 MCG/2ML IJ SOLN
25.0000 ug | INTRAMUSCULAR | Status: DC | PRN
  Administered 2018-04-09 (×2): 50 ug via INTRAVENOUS

## 2018-04-09 MED ORDER — ALUM & MAG HYDROXIDE-SIMETH 200-200-20 MG/5ML PO SUSP: 30.0000 mL | ORAL | Status: DC | PRN

## 2018-04-09 MED ORDER — ZOLPIDEM TARTRATE 5 MG PO TABS: 5.0000 mg | ORAL_TABLET | Freq: Every evening | ORAL | Status: DC | PRN

## 2018-04-09 MED ORDER — DOCUSATE SODIUM 100 MG PO CAPS
100.0000 mg | ORAL_CAPSULE | Freq: Two times a day (BID) | ORAL | Status: DC
  Administered 2018-04-09 – 2018-04-11 (×4): 100 mg via ORAL
  Filled 2018-04-09 (×4): qty 1

## 2018-04-09 MED ORDER — ONDANSETRON HCL 4 MG PO TABS: 4.0000 mg | ORAL_TABLET | Freq: Four times a day (QID) | ORAL | Status: DC | PRN

## 2018-04-09 MED ORDER — TRANEXAMIC ACID 1000 MG/10ML IV SOLN
INTRAVENOUS | Status: DC | PRN
  Administered 2018-04-09: 1000 mg via INTRAVENOUS

## 2018-04-09 MED ORDER — PHENYLEPHRINE HCL 10 MG/ML IJ SOLN
INTRAMUSCULAR | Status: DC | PRN
  Administered 2018-04-09 (×2): 100 ug via INTRAVENOUS

## 2018-04-09 MED ORDER — METHOCARBAMOL 1000 MG/10ML IJ SOLN
500.0000 mg | Freq: Four times a day (QID) | INTRAVENOUS | Status: DC | PRN
  Filled 2018-04-09: qty 5

## 2018-04-09 MED ORDER — TRAMADOL HCL 50 MG PO TABS
50.0000 mg | ORAL_TABLET | Freq: Four times a day (QID) | ORAL | Status: DC
  Administered 2018-04-09 – 2018-04-11 (×8): 50 mg via ORAL
  Filled 2018-04-09 (×8): qty 1

## 2018-04-09 MED ORDER — METHOCARBAMOL 500 MG PO TABS
500.0000 mg | ORAL_TABLET | Freq: Four times a day (QID) | ORAL | Status: DC | PRN
  Administered 2018-04-09: 500 mg via ORAL
  Filled 2018-04-09 (×2): qty 1

## 2018-04-09 MED ORDER — NEOMYCIN-POLYMYXIN B GU 40-200000 IR SOLN
Status: DC | PRN
  Administered 2018-04-09: 4 mL

## 2018-04-09 MED ORDER — ACETAMINOPHEN 500 MG PO TABS
1000.0000 mg | ORAL_TABLET | Freq: Four times a day (QID) | ORAL | Status: AC
  Administered 2018-04-09 – 2018-04-10 (×4): 1000 mg via ORAL
  Filled 2018-04-09 (×4): qty 2

## 2018-04-09 MED ORDER — INFLUENZA VAC SPLIT HIGH-DOSE 0.5 ML IM SUSY
0.5000 mL | PREFILLED_SYRINGE | INTRAMUSCULAR | Status: AC
  Administered 2018-04-10: 0.5 mL via INTRAMUSCULAR
  Filled 2018-04-09: qty 0.5

## 2018-04-09 MED ORDER — ACETAMINOPHEN 325 MG PO TABS: 325.0000 mg | ORAL_TABLET | Freq: Four times a day (QID) | ORAL | Status: DC | PRN

## 2018-04-09 MED ORDER — PROPOFOL 500 MG/50ML IV EMUL
INTRAVENOUS | Status: DC | PRN
  Administered 2018-04-09: 50 ug/kg/min via INTRAVENOUS

## 2018-04-09 MED ORDER — HYDROMORPHONE HCL 1 MG/ML IJ SOLN
0.5000 mg | INTRAMUSCULAR | Status: DC | PRN
  Administered 2018-04-09: 0.5 mg via INTRAVENOUS
  Filled 2018-04-09: qty 1

## 2018-04-09 MED ORDER — TRANEXAMIC ACID 1000 MG/10ML IV SOLN
INTRAVENOUS | Status: AC
  Filled 2018-04-09: qty 10

## 2018-04-09 MED ORDER — ONDANSETRON HCL 4 MG/2ML IJ SOLN: 4.0000 mg | Freq: Once | INTRAMUSCULAR | Status: DC | PRN

## 2018-04-09 MED ORDER — FENTANYL CITRATE (PF) 100 MCG/2ML IJ SOLN
INTRAMUSCULAR | Status: AC
  Filled 2018-04-09: qty 2

## 2018-04-09 MED ORDER — BUPIVACAINE-EPINEPHRINE 0.25% -1:200000 IJ SOLN
INTRAMUSCULAR | Status: DC | PRN
  Administered 2018-04-09: 30 mL

## 2018-04-09 MED ORDER — LATANOPROST 0.005 % OP SOLN
1.0000 [drp] | Freq: Every day | OPHTHALMIC | Status: DC
  Administered 2018-04-09: 1 [drp] via OPHTHALMIC
  Filled 2018-04-09: qty 2.5

## 2018-04-09 MED ORDER — MIDAZOLAM HCL 2 MG/2ML IJ SOLN
INTRAMUSCULAR | Status: AC
  Filled 2018-04-09: qty 2

## 2018-04-09 MED ORDER — LACTATED RINGERS IV SOLN
INTRAVENOUS | Status: DC
  Administered 2018-04-09 (×2): via INTRAVENOUS

## 2018-04-09 MED ORDER — FENTANYL CITRATE (PF) 100 MCG/2ML IJ SOLN
INTRAMUSCULAR | Status: DC | PRN
  Administered 2018-04-09 (×2): 50 ug via INTRAVENOUS

## 2018-04-09 MED ORDER — MIDAZOLAM HCL 5 MG/5ML IJ SOLN
INTRAMUSCULAR | Status: DC | PRN
  Administered 2018-04-09: 2 mg via INTRAVENOUS

## 2018-04-09 MED ORDER — SODIUM CHLORIDE 0.9 % IV SOLN
INTRAVENOUS | Status: DC | PRN
  Administered 2018-04-09: 30 ug/min via INTRAVENOUS

## 2018-04-09 MED ORDER — MEPERIDINE HCL 50 MG/ML IJ SOLN
12.5000 mg | Freq: Once | INTRAMUSCULAR | Status: AC
  Administered 2018-04-09: 12.5 mg via INTRAVENOUS

## 2018-04-09 MED ORDER — OXYCODONE HCL 5 MG PO TABS
5.0000 mg | ORAL_TABLET | ORAL | Status: DC | PRN
  Administered 2018-04-11: 10 mg via ORAL
  Filled 2018-04-09 (×3): qty 2

## 2018-04-09 MED ORDER — MAGNESIUM CITRATE PO SOLN
1.0000 | Freq: Once | ORAL | Status: AC | PRN
  Administered 2018-04-11: 1 via ORAL
  Filled 2018-04-09 (×2): qty 296

## 2018-04-09 MED ORDER — CEFAZOLIN SODIUM-DEXTROSE 2-4 GM/100ML-% IV SOLN
INTRAVENOUS | Status: AC
  Filled 2018-04-09: qty 100

## 2018-04-09 MED ORDER — SENNOSIDES-DOCUSATE SODIUM 8.6-50 MG PO TABS
1.0000 | ORAL_TABLET | Freq: Every evening | ORAL | Status: DC | PRN
  Administered 2018-04-10: 1 via ORAL
  Filled 2018-04-09: qty 1

## 2018-04-09 MED ORDER — DIPHENHYDRAMINE HCL 12.5 MG/5ML PO ELIX: 12.5000 mg | ORAL_SOLUTION | ORAL | Status: DC | PRN

## 2018-04-09 MED ORDER — OXYCODONE HCL 5 MG PO TABS
10.0000 mg | ORAL_TABLET | ORAL | Status: DC | PRN
  Administered 2018-04-09 – 2018-04-10 (×2): 10 mg via ORAL

## 2018-04-09 MED ORDER — MEPERIDINE HCL 50 MG/ML IJ SOLN: 12.5000 mg | Freq: Once | INTRAMUSCULAR | Status: DC

## 2018-04-09 MED ORDER — METOCLOPRAMIDE HCL 10 MG PO TABS: 5.0000 mg | ORAL_TABLET | Freq: Three times a day (TID) | ORAL | Status: DC | PRN

## 2018-04-09 MED ORDER — BUPIVACAINE HCL (PF) 0.5 % IJ SOLN
INTRAMUSCULAR | Status: DC | PRN
  Administered 2018-04-09: 3 mL via INTRATHECAL

## 2018-04-09 SURGICAL SUPPLY — 58 items
BLADE SAGITTAL AGGR TOOTH XLG (BLADE) ×3 IMPLANT
BNDG COHESIVE 6X5 TAN STRL LF (GAUZE/BANDAGES/DRESSINGS) ×9 IMPLANT
CANISTER SUCT 1200ML W/VALVE (MISCELLANEOUS) ×3 IMPLANT
CHLORAPREP W/TINT 26ML (MISCELLANEOUS) ×3 IMPLANT
COVER WAND RF STERILE (DRAPES) ×3 IMPLANT
DRAPE C-ARM XRAY 36X54 (DRAPES) ×3 IMPLANT
DRAPE INCISE IOBAN 66X60 STRL (DRAPES) IMPLANT
DRAPE POUCH INSTRU U-SHP 10X18 (DRAPES) ×3 IMPLANT
DRAPE SHEET LG 3/4 BI-LAMINATE (DRAPES) ×9 IMPLANT
DRAPE TABLE BACK 80X90 (DRAPES) ×3 IMPLANT
DRESSING SURGICEL FIBRLLR 1X2 (HEMOSTASIS) ×2 IMPLANT
DRSG OPSITE POSTOP 4X8 (GAUZE/BANDAGES/DRESSINGS) ×6 IMPLANT
DRSG SURGICEL FIBRILLAR 1X2 (HEMOSTASIS) ×6
ELECT BLADE 6.5 EXT (BLADE) ×3 IMPLANT
ELECT REM PT RETURN 9FT ADLT (ELECTROSURGICAL) ×3
ELECTRODE REM PT RTRN 9FT ADLT (ELECTROSURGICAL) ×1 IMPLANT
GLOVE BIOGEL PI IND STRL 9 (GLOVE) ×1 IMPLANT
GLOVE BIOGEL PI INDICATOR 9 (GLOVE) ×2
GLOVE SURG SYN 9.0  PF PI (GLOVE) ×4
GLOVE SURG SYN 9.0 PF PI (GLOVE) ×2 IMPLANT
GOWN SRG 2XL LVL 4 RGLN SLV (GOWNS) ×1 IMPLANT
GOWN STRL NON-REIN 2XL LVL4 (GOWNS) ×2
GOWN STRL REUS W/ TWL LRG LVL3 (GOWN DISPOSABLE) ×1 IMPLANT
GOWN STRL REUS W/TWL LRG LVL3 (GOWN DISPOSABLE) ×3
HEMOVAC 400CC 10FR (MISCELLANEOUS) IMPLANT
HIP FEM HD S 28 (Head) ×3 IMPLANT
HOLDER FOLEY CATH W/STRAP (MISCELLANEOUS) ×3 IMPLANT
HOOD PEEL AWAY FLYTE STAYCOOL (MISCELLANEOUS) ×3 IMPLANT
KIT PREVENA INCISION MGT 13 (CANNISTER) ×3 IMPLANT
LINER DUAL MOB 50MM (Liner) ×3 IMPLANT
MAT ABSORB  FLUID 56X50 GRAY (MISCELLANEOUS) ×2
MAT ABSORB FLUID 56X50 GRAY (MISCELLANEOUS) ×1 IMPLANT
NDL SAFETY ECLIPSE 18X1.5 (NEEDLE) ×1 IMPLANT
NEEDLE HYPO 18GX1.5 SHARP (NEEDLE) ×2
NEEDLE SPNL 18GX3.5 QUINCKE PK (NEEDLE) ×3 IMPLANT
NS IRRIG 1000ML POUR BTL (IV SOLUTION) ×3 IMPLANT
PACK HIP COMPR (MISCELLANEOUS) ×3 IMPLANT
SCALPEL PROTECTED #10 DISP (BLADE) ×6 IMPLANT
SHELL ACETABULAR SZ0 50 DME (Shell) ×3 IMPLANT
SOL PREP PVP 2OZ (MISCELLANEOUS) ×3
SOLUTION PREP PVP 2OZ (MISCELLANEOUS) ×1 IMPLANT
SPONGE DRAIN TRACH 4X4 STRL 2S (GAUZE/BANDAGES/DRESSINGS) ×3 IMPLANT
STAPLER SKIN PROX 35W (STAPLE) ×3 IMPLANT
STEM FEMORAL SZ2 LAT COLLARED (Stem) ×3 IMPLANT
STRAP SAFETY 5IN WIDE (MISCELLANEOUS) ×3 IMPLANT
SUT DVC 2 QUILL PDO  T11 36X36 (SUTURE) ×2
SUT DVC 2 QUILL PDO T11 36X36 (SUTURE) ×1 IMPLANT
SUT SILK 0 (SUTURE) ×2
SUT SILK 0 30XBRD TIE 6 (SUTURE) ×1 IMPLANT
SUT V-LOC 90 ABS DVC 3-0 CL (SUTURE) ×3 IMPLANT
SUT VIC AB 1 CT1 36 (SUTURE) ×3 IMPLANT
SYR 20CC LL (SYRINGE) ×3 IMPLANT
SYR 30ML LL (SYRINGE) ×3 IMPLANT
SYR BULB IRRIG 60ML STRL (SYRINGE) ×3 IMPLANT
TAPE MICROFOAM 4IN (TAPE) ×3 IMPLANT
TOWEL OR 17X26 4PK STRL BLUE (TOWEL DISPOSABLE) ×3 IMPLANT
TRAY FOLEY MTR SLVR 16FR STAT (SET/KITS/TRAYS/PACK) ×3 IMPLANT
WND VAC CANISTER 500ML (MISCELLANEOUS) ×3 IMPLANT

## 2018-04-09 NOTE — H&P (Signed)
Reviewed paper H+P, will be scanned into chart. No changes noted.  

## 2018-04-09 NOTE — Plan of Care (Signed)

## 2018-04-09 NOTE — Transfer of Care (Signed)
Immediate Anesthesia Transfer of Care Note  Patient: Patricia Richardson  Procedure(s) Performed: TOTAL HIP ARTHROPLASTY ANTERIOR APPROACH (Right Hip)  Patient Location: PACU  Anesthesia Type:Spinal  Level of Consciousness: awake, alert , oriented and patient cooperative  Airway & Oxygen Therapy: Patient Spontanous Breathing  Post-op Assessment: Report given to RN, Post -op Vital signs reviewed and stable and Patient moving all extremities  Post vital signs: Reviewed and stable  Last Vitals:  Vitals Value Taken Time  BP 145/92 04/09/2018  2:33 PM  Temp 36.5 C 04/09/2018  2:34 PM  Pulse 85 04/09/2018  2:37 PM  Resp 19 04/09/2018  2:37 PM  SpO2 100 % 04/09/2018  2:37 PM  Vitals shown include unvalidated device data.  Last Pain:  Vitals:   04/09/18 1037  TempSrc: Oral  PainSc: 3          Complications: No apparent anesthesia complications

## 2018-04-09 NOTE — Anesthesia Post-op Follow-up Note (Signed)
Anesthesia QCDR form completed.        

## 2018-04-09 NOTE — Op Note (Signed)
04/09/2018  2:29 PM  PATIENT:  Althia [place]  67 y.o. female  PRE-OPERATIVE DIAGNOSIS:  primary osteoarthritis right hip  POST-OPERATIVE DIAGNOSIS:  primary osteoarthritis right hip  PROCEDURE:  Procedure(s): TOTAL HIP ARTHROPLASTY ANTERIOR APPROACH (Right)  SURGEON: Laurene Footman, MD  ASSISTANTS: None  ANESTHESIA:   spinal  EBL:  Total I/O In: 1000 [I.V.:1000] Out: 350 [Blood:350]  BLOOD ADMINISTERED:none  DRAINS: (2) Hemovact drain(s) in the Subcutaneous layer with  Suction Open   LOCAL MEDICATIONS USED:  MARCAINE    Marcaine  SPECIMEN:  Source of Specimen:  Right femoral head  DISPOSITION OF SPECIMEN:  PATHOLOGY  COUNTS:  YES  TOURNIQUET:  * No tourniquets in log *  IMPLANTS: Medacta AMIS to lateralized stem with S metal 28 mm head, 50 mm Mpact DM cup and liner  DICTATION: .Dragon Dictation   The patient was brought to the operating room and after spinal anesthesia was obtained patient was placed on the operative table with the ipsilateral foot into the Medacta attachment, contralateral leg on a well-padded table. C-arm was brought in and preop template x-ray taken. After prepping and draping in usual sterile fashion appropriate patient identification and timeout procedures were completed. Anterior approach to the hip was obtained and centered over the greater trochanter and TFL muscle. The subcutaneous tissue was incised hemostasis being achieved by electrocautery. TFL fascia was incised and the muscle retracted laterally deep retractor placed. The lateral femoral circumflex vessels were identified and ligated. The anterior capsule was exposed and a capsulotomy performed. The neck was identified and a femoral neck cut carried out with a saw. The head was removed without difficulty and showed sclerotic femoral head and acetabulum. Reaming was carried out to 50 mm and a 50 mm cup trial gave appropriate tightness to the acetabular component a 50 DM cup was impacted into  position. The leg was then externally rotated and ischiofemoral and pubofemoral releases carried out. The femur was sequentially broached to a size 2, size 2 lateralized with as had trials were placed and the final components chosen. The 2 lateralized stem was inserted along with a metal S 28 mm head and 50 mm liner. The hip was reduced and was stable the wound was thoroughly irrigated with fibrillar placed along the posterior capsule and medial neck. The deep fascia ws closed using a heavy Quill after infiltration of 30 cc of quarter percent Sensorcaine with epinephrine, subcutaneous drains placed and then skin closed with .3-0 V-loc to close the skin with skin staples.  Incisional wound VAC applied  PLAN OF CARE: Admit to inpatient

## 2018-04-09 NOTE — Anesthesia Procedure Notes (Signed)
Spinal  Patient location during procedure: OR Staffing Performed: resident/CRNA  Preanesthetic Checklist Completed: patient identified, site marked, surgical consent, pre-op evaluation, timeout performed, IV checked, risks and benefits discussed and monitors and equipment checked Spinal Block Patient position: sitting Prep: ChloraPrep Patient monitoring: heart rate, continuous pulse ox, blood pressure and cardiac monitor Approach: midline Location: L4-5 Injection technique: single-shot Needle Needle type: Whitacre and Introducer  Needle gauge: 24 G Needle length: 9 cm Additional Notes Negative paresthesia. Negative blood return. Positive free-flowing CSF. Expiration date of kit checked and confirmed. Patient tolerated procedure well, without complications.       

## 2018-04-09 NOTE — Anesthesia Preprocedure Evaluation (Signed)
Anesthesia Evaluation  Patient identified by MRN, date of birth, ID band Patient awake    Reviewed: Allergy & Precautions, NPO status , Patient's Chart, lab work & pertinent test results, reviewed documented beta blocker date and time   Airway Mallampati: II  TM Distance: >3 FB     Dental  (+) Chipped   Pulmonary neg pulmonary ROS,           Cardiovascular hypertension, Pt. on medications      Neuro/Psych PSYCHIATRIC DISORDERS Depression TIA Neuromuscular disease CVA    GI/Hepatic Neg liver ROS, GERD  ,  Endo/Other  negative endocrine ROS  Renal/GU negative Renal ROS  negative genitourinary   Musculoskeletal  (+) Arthritis , Osteoarthritis,    Abdominal   Peds negative pediatric ROS (+)  Hematology negative hematology ROS (+)   Anesthesia Other Findings Denies stroke. Has had a TIA in 2003.  Reproductive/Obstetrics                             Anesthesia Physical  Anesthesia Plan  ASA: III  Anesthesia Plan:    Post-op Pain Management:    Induction: Intravenous  PONV Risk Score and Plan:   Airway Management Planned: Nasal Cannula  Additional Equipment:   Intra-op Plan:   Post-operative Plan:   Informed Consent: I have reviewed the patients History and Physical, chart, labs and discussed the procedure including the risks, benefits and alternatives for the proposed anesthesia with the patient or authorized representative who has indicated his/her understanding and acceptance.     Plan Discussed with: CRNA  Anesthesia Plan Comments:         Anesthesia Quick Evaluation

## 2018-04-09 NOTE — NC FL2 (Signed)
[place] LEVEL OF CARE SCREENING TOOL     IDENTIFICATION  Patient Name: Patricia Richardson Birthdate: 08/18/1950 Sex: female Admission Date (Current Location): 04/09/2018  [place] and [place] Number:  Engineering geologist and Address:  [facility], [address] [place], [place] [address]      Provider Number: [id number]  Attending Physician Name and Address:  Hessie Knows, MD  Relative Name and Phone Number:       Current Level of Care: Hospital Recommended Level of Care: [place] Prior Approval Number:    Date Approved/Denied:   PASRR Number: ([id number])  Discharge Plan: SNF    Current Diagnoses: Patient Active Problem List   Diagnosis Date Noted  . Status post total hip replacement, 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    Orientation RESPIRATION BLADDER Height & Weight     Self, Time, Situation, Place  Normal Continent Weight:   Height:     BEHAVIORAL SYMPTOMS/MOOD NEUROLOGICAL BOWEL NUTRITION STATUS      Continent Diet(Diet: Regular )  AMBULATORY STATUS COMMUNICATION OF NEEDS Skin   Extensive Assist Verbally Surgical wounds(Incision: Right Hip. )                       Personal Care Assistance Level of Assistance  Bathing, Feeding, Dressing Bathing Assistance: Limited assistance Feeding assistance: Independent Dressing Assistance: Limited assistance     Functional Limitations Info  Sight, Hearing, Speech Sight Info: Adequate Hearing Info: Adequate Speech Info: Adequate    SPECIAL CARE FACTORS FREQUENCY  PT (By licensed PT), OT (By licensed OT)     PT Frequency: (5) OT Frequency: (5)            Contractures      Additional Factors Info  Code Status, Allergies Code Status Info: (Full Code. ) Allergies Info: (Codeine)           Current Medications (04/09/2018):  This is the current hospital active medication list Current Facility-Administered Medications  Medication Dose Route Frequency Provider  Last Rate Last Dose  . acetaminophen (TYLENOL) tablet 1,000 mg  1,000 mg Oral Q6H Hessie Knows, MD   1,000 mg at 04/09/18 1714  . [START ON 04/10/2018] acetaminophen (TYLENOL) tablet 325-650 mg  325-650 mg Oral Q6H PRN Hessie Knows, MD      . alum & mag hydroxide-simeth (MAALOX/MYLANTA) 200-200-20 MG/5ML suspension 30 mL  30 mL Oral Q4H PRN Hessie Knows, MD      . aspirin chewable tablet 81 mg  81 mg Oral BID Hessie Knows, MD      . bisacodyl (DULCOLAX) EC tablet 5 mg  5 mg Oral Daily PRN Hessie Knows, MD      . ceFAZolin (ANCEF) IVPB 2g/100 mL premix  2 g Intravenous Q6H Hessie Knows, MD      . diphenhydrAMINE (BENADRYL) 12.5 MG/5ML elixir 12.5-25 mg  12.5-25 mg Oral Q4H PRN Hessie Knows, MD      . docusate sodium (COLACE) capsule 100 mg  100 mg Oral BID Hessie Knows, MD      . fentaNYL (SUBLIMAZE) 100 MCG/2ML injection           . gabapentin (NEURONTIN) capsule 300 mg  300 mg Oral TID Hessie Knows, MD      . HYDROmorphone (DILAUDID) injection 0.5-1 mg  0.5-1 mg Intravenous Q4H PRN Hessie Knows, MD      . latanoprost (XALATAN) 0.005 % ophthalmic solution 1 drop  1 drop Both Eyes QHS Hessie Knows, MD      .  magnesium citrate solution 1 Bottle  1 Bottle Oral Once PRN Hessie Knows, MD      . menthol-cetylpyridinium (CEPACOL) lozenge 3 mg  1 lozenge Oral PRN Hessie Knows, MD       Or  . phenol (CHLORASEPTIC) mouth spray 1 spray  1 spray Mouth/Throat PRN Hessie Knows, MD      . meperidine (DEMEROL) 50 MG/ML injection           . methocarbamol (ROBAXIN) tablet 500 mg  500 mg Oral Q6H PRN Hessie Knows, MD       Or  . methocarbamol (ROBAXIN) 500 mg in dextrose 5 % 50 mL IVPB  500 mg Intravenous Q6H PRN Hessie Knows, MD      . metoCLOPramide (REGLAN) tablet 5-10 mg  5-10 mg Oral Q8H PRN Hessie Knows, MD       Or  . metoCLOPramide (REGLAN) injection 5-10 mg  5-10 mg Intravenous Q8H PRN Hessie Knows, MD      . ondansetron [facility]) tablet 4 mg  4 mg Oral Q6H PRN Hessie Knows, MD        Or  . ondansetron [facility]) injection 4 mg  4 mg Intravenous Q6H PRN Hessie Knows, MD      . oxyCODONE (Oxy IR/ROXICODONE) immediate release tablet 10-15 mg  10-15 mg Oral Q4H PRN Hessie Knows, MD      . oxyCODONE (Oxy IR/ROXICODONE) immediate release tablet 5-10 mg  5-10 mg Oral Q4H PRN Hessie Knows, MD      . pantoprazole (PROTONIX) EC tablet 80 mg  80 mg Oral Daily Hessie Knows, MD      . senna-docusate (Senokot-S) tablet 1 tablet  1 tablet Oral QHS PRN Hessie Knows, MD      . traMADol [facility]) tablet 50 mg  50 mg Oral Q6H Hessie Knows, MD   50 mg at 04/09/18 1714  . zolpidem (AMBIEN) tablet 5 mg  5 mg Oral QHS PRN Hessie Knows, MD         Discharge Medications: Please see discharge summary for a list of discharge medications.  Relevant Imaging Results:  Relevant Lab Results:   Additional Information (SSN: [id number])  Jenise Iannelli, Veronia Beets, LCSW

## 2018-04-10 ENCOUNTER — Encounter: Payer: Self-pay | Admitting: Orthopedic Surgery

## 2018-04-10 LAB — BASIC METABOLIC PANEL
Anion gap: 8 (ref 5–15)
BUN: 12 mg/dL (ref 8–23)
CALCIUM: 8.8 mg/dL — AB (ref 8.9–10.3)
CHLORIDE: 108 mmol/L (ref 98–111)
CO2: 24 mmol/L (ref 22–32)
CREATININE: 0.6 mg/dL (ref 0.44–1.00)
GFR calc non Af Amer: >60 mL/min (ref >60)
Glucose, Bld: 144 mg/dL — ABNORMAL HIGH (ref 70–99)
Potassium: 4.2 mmol/L (ref 3.5–5.1)
SODIUM: 140 mmol/L (ref 135–145)

## 2018-04-10 LAB — CBC
HEMATOCRIT: 38.1 % (ref 36.0–46.0)
HEMOGLOBIN: 12.7 g/dL (ref 12.0–15.0)
MCH: 30.2 pg (ref 26.0–34.0)
MCHC: 33.3 g/dL (ref 30.0–36.0)
MCV: 90.5 fL (ref 80.0–100.0)
NRBC: 0 % (ref 0.0–0.2)
Platelets: 310 10*3/uL (ref 150–400)
RBC: 4.21 MIL/uL (ref 3.87–5.11)
RDW: 13.2 % (ref 11.5–15.5)
WBC: 16 10*3/uL — AB (ref 4.0–10.5)

## 2018-04-10 MED ORDER — MAGNESIUM HYDROXIDE 400 MG/5ML PO SUSP
30.0000 mL | Freq: Once | ORAL | Status: AC
  Administered 2018-04-10: 30 mL via ORAL
  Filled 2018-04-10: qty 30

## 2018-04-10 NOTE — Progress Notes (Signed)
   Subjective: 1 Day Post-Op Procedure(s) (LRB): TOTAL HIP ARTHROPLASTY ANTERIOR APPROACH (Right) Patient reports pain as mild.   Patient is well, and has had no acute complaints or problems Denies any CP, SOB, ABD pain. We will continue therapy today.  Plan is to go Home after hospital stay.  Objective: Vital signs in last 24 hours: Temp:  [97.3 F (36.3 C)-98.3 F (36.8 C)] 97.6 F (36.4 C) (11/06 0421) Pulse Rate:  [70-92] 90 (11/06 0421) Resp:  [17-29] 18 (11/06 0421) BP: (102-151)/(53-92) 117/53 (11/06 0421) SpO2:  [95 %-100 %] 98 % (11/06 0421)  Intake/Output from previous day: 11/05 0701 - 11/06 0700 In: 1980 [P.O.:480; I.V.:1300; IV Piggyback:200] Out: 1390 [Urine:1000; Drains:40; Blood:350] Intake/Output this shift: No intake/output data recorded.  Recent Labs    04/10/18 0327  HGB 12.7   Recent Labs    04/10/18 0327  WBC 16.0*  RBC 4.21  HCT 38.1  PLT 310   Recent Labs    04/10/18 0327  NA 140  K 4.2  CL 108  CO2 24  BUN 12  CREATININE 0.60  GLUCOSE 144*  CALCIUM 8.8*   Recent Labs    04/09/18 1214  INR 0.93    EXAM General - Patient is Alert, Appropriate and Oriented Extremity - Neurovascular intact Sensation intact distally Intact pulses distally Dorsiflexion/Plantar flexion intact No cellulitis present Compartment soft Dressing - dressing C/D/I and no drainage.  Hemovac and wound VAC intact. Motor Function - intact, moving foot and toes well on exam.   Past Medical History:  Diagnosis Date  . 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 Arthritis   . Cystocele   . Depression   . GERD (gastroesophageal reflux disease)   . History of chicken pox   . Hypertension   . 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   Weight as of 03/29/18: 88.5 kg. Advance diet Up with therapy  Pain well controlled Vital signs are stable Labs are stable, recheck labs in the morning Needs bowel movement Care management to assist with discharge to home with home health   DVT Prophylaxis - Aspirin, TED hose and SCDs Weight-Bearing as tolerated to right leg   T. Rachelle Hora, PA-C [place] 04/10/2018, 7:29 AM

## 2018-04-10 NOTE — Evaluation (Signed)
Occupational Therapy Evaluation Patient Details Name: Patricia Richardson MRN: [id number] DOB: 07/18/1950 Today's Date: 04/10/2018    History of Present Illness Pt is a 67 y.o female s/p R THA anterior approach 04/09/18.  PMH includes htn, TIA, depression, and OA.   Clinical Impression   Pt seen for OT evaluation this date, POD#1 from above surgery. Pt was independent in all ADLs prior to surgery. Pt lives alone in a house with 2 steps to enter. Pt has family available PRN. Pt is eager to return to PLOF with less pain and improved safety and independence. Pt currently requires PRN minimal assist for LB dressing sit<>stand and  PRN minimal assist bathing while in seated position due to pain and limited AROM of R hip. Pt instructed in self care skills, falls prevention strategies, home/routines modifications, DME/AE for LB bathing and dressing tasks, and compression stocking mgt strategies. Pt verbalized understanding of all education/training presented at this time. Pt would benefit from additional instruction in self care skills and techniques to help maintain precautions with or without assistive devices to support recall and carryover prior to discharge. No further OT services are required after discharge.     Follow Up Recommendations  No OT follow up    Equipment Recommendations  3 in 1 bedside commode    Recommendations for Other Services       Precautions / Restrictions Precautions Precautions: Fall;Anterior Hip ) Precaution Comments: wound vac; hemovac Restrictions Weight Bearing Restrictions: Yes RLE Weight Bearing: Weight bearing as tolerated      Mobility Bed Mobility   General bed mobility comments: deferred; pt in chair upon arrival  Transfers Overall transfer level: Needs assistance Equipment used: Rolling walker (2 wheeled) Transfers: Sit to/from Stand Sit to Stand: Min guard            Balance Overall balance assessment: Needs assistance Sitting-balance support:  No upper extremity supported;Feet supported Sitting balance-Leahy Scale: Good   Standing balance support: Bilateral upper extremity supported Standing balance-Leahy Scale: Poor Standing balance comment: BUE on RW at standing                           ADL either performed or assessed with clinical judgement   ADL Overall ADL's : Needs assistance/impaired Eating/Feeding: Independent   Grooming: Supervision/safety;Standing   Upper Body Bathing: Sitting;Supervision/ safety   Lower Body Bathing: Minimal assistance;Sit to/from stand Lower Body Bathing Details (indicate cue type and reason): PRN assist Upper Body Dressing : Supervision/safety;Sitting   Lower Body Dressing: Sit to/from stand;Minimal assistance Lower Body Dressing Details (indicate cue type and reason): PRN assist Toilet Transfer: Min guard;RW;Ambulation;Regular Toilet           Functional mobility during ADLs: Min guard;Rolling walker       Vision Baseline Vision/History: Wears glasses Wears Glasses: At all times Patient Visual Report: No change from baseline       Perception     Praxis      Pertinent Vitals/Pain Pain Assessment: 0-10 Pain Score: 1  Pain Location: R hip Pain Descriptors / Indicators: Aching;Tightness Pain Intervention(s): Monitored during session;Limited activity within patient's tolerance;Repositioned     Hand Dominance     Extremity/Trunk Assessment Upper Extremity Assessment Upper Extremity Assessment: Overall WFL for tasks assessed   Lower Extremity Assessment Lower Extremity Assessment: Defer to PT evaluation       Communication Communication Communication: No difficulties   Cognition Arousal/Alertness: Awake/alert Behavior During Therapy: WFL for tasks assessed/performed Overall  Cognitive Status: Within Functional Limits for tasks assessed                                     General Comments  wound vac and hemovac intact beginning/end of  session    Exercises  Other Exercises: Pt/family educated in compression sock mgt including donning/doffing, wear schedule and positioning. Other Exercises: Pt/family educated in use of AE for LB dressing tasks.  Other Exercises: Pt/family educated in home modifications for increased safety when completing ADl tasks.    Shoulder Instructions      Home Living Family/patient expects to be discharged to:: Private residence Living Arrangements: Alone Available Help at Discharge: Family;Available PRN/intermittently Type of Home: House Home Access: Stairs to enter [employer] of Steps: 2 steps with R railing and then small threshold to enter home Entrance Stairs-Rails: Right Home Layout: One level     Bathroom Shower/Tub: Occupational psychologist: Handicapped height     Home Equipment: Cane - single point;Grab bars - tub/shower;Shower seat - built in          Prior Functioning/Environment Level of Independence: Independent        Comments: Pt reports no falls in past 12 months. Pt independent in all ADL/IADL tasks +driving. Pt enjoys completing yard work outside.         OT Problem List: Decreased strength;Impaired balance (sitting and/or standing);Pain;Decreased range of motion;Decreased knowledge of use of DME or AE      OT Treatment/Interventions: Self-care/ADL training;DME and/or AE instruction;Therapeutic activities;Therapeutic exercise;Patient/family education    OT Goals(Current goals can be found in the care plan section) Acute Rehab OT Goals Patient Stated Goal: to get back outside and do yardwork OT Goal Formulation: With patient/family Time For Goal Achievement: 04/24/18 Potential to Achieve Goals: Good ADL Goals Pt Will Perform Lower Body Dressing: with supervision;sit to/from stand;with adaptive equipment Additional ADL Goal #1: Pt will independently educate caregiver in compression sock mgt including donning/doffing, wear schedule and  positioning. Additional ADL Goal #2: Pt will utilize at least two falls prevention strategies when completing ADL and IADL tasks.  OT Frequency: Min 1X/week   Barriers to D/C:            Co-evaluation              AM-PAC PT "6 Clicks" Daily Activity     Outcome Measure Help from another person eating meals?: None Help from another person taking care of personal grooming?: None Help from another person toileting, which includes using toliet, bedpan, or urinal?: A Little Help from another person bathing (including washing, rinsing, drying)?: A Little Help from another person to put on and taking off regular upper body clothing?: None Help from another person to put on and taking off regular lower body clothing?: A Little 6 Click Score: 21   End of Session Equipment Utilized During Treatment: Gait belt;Rolling walker  Activity Tolerance: Patient tolerated treatment well Patient left: Other (comment)(Pt left on commode; PT in room to start PT session.  )  OT Visit Diagnosis: Other abnormalities of gait and mobility (R26.89);Pain                Time: 7371-0626 OT Time Calculation (min): 27 min Charges:     Jadene Pierini OTS  04/10/2018, 5:39 PM

## 2018-04-10 NOTE — Progress Notes (Signed)
Physical Therapy Treatment Patient Details Name: Patricia Richardson MRN: [id number] DOB: 10/23/1950 Today's Date: 04/10/2018    History of Present Illness Pt is a 67 y.o female s/p R THA anterior approach 04/09/18.  PMH includes htn, TIA, depression, and OA.    PT Comments    Pt able to progress to ambulating 90 feet with youth sized RW with CGA; vc's required to increase UE support through RW to decrease antalgic gait.  4/10 R hip pain beginning of session and 5/10 end of session resting in bed.  Will continue to progress pt with strengthening, increasing ambulation distance, and attempt stairs as appropriate tomorrow.    Follow Up Recommendations  Home health PT     Equipment Recommendations  Rolling walker with 5" wheels(youth sized (pt's family to bring in family walker to see if correct fit))    Recommendations for Other Services OT consult     Precautions / Restrictions Precautions Precautions: Fall;Anterior Hip Precaution Booklet Issued: Yes (comment) Precaution Comments: wound vac; hemovac Restrictions Weight Bearing Restrictions: Yes RLE Weight Bearing: Weight bearing as tolerated    Mobility  Bed Mobility Overal bed mobility: Needs Assistance Bed Mobility: Sit to Supine       Sit to supine: Min assist   General bed mobility comments: assist for R LE; vc's for use of bed rail; increased effort and time for pt to perform  Transfers Overall transfer level: Needs assistance Equipment used: Rolling walker (2 wheeled)(youth sized) Transfers: Sit to/from Stand Sit to Stand: Min guard;Supervision         General transfer comment: supervision standing from toilet with grab bar  Ambulation/Gait Ambulation/Gait assistance: Min guard Gait Distance (Feet): 90 Feet Assistive device: Rolling walker (2 wheeled)(youth sized) Gait Pattern/deviations: Antalgic Gait velocity: decreased   General Gait Details: decreased stance time R LE; vc's to increase UE support through  RW and for correct positioning within walker; step to gait pattern   Stairs             Wheelchair Mobility    Modified Rankin (Stroke Patients Only)       Balance Overall balance assessment: Needs assistance Sitting-balance support: No upper extremity supported;Feet supported Sitting balance-Leahy Scale: Good Sitting balance - Comments: steady sitting reaching within BOS   Standing balance support: Single extremity supported Standing balance-Leahy Scale: Poor Standing balance comment: requires at least single UE support for static standing balance                            Cognition Arousal/Alertness: Awake/alert Behavior During Therapy: WFL for tasks assessed/performed Overall Cognitive Status: Within Functional Limits for tasks assessed                                        Exercises General Exercises - Lower Extremity Long Arc Quad: AROM;Strengthening;Right;10 reps;Seated Hip Flexion/Marching: AAROM;Strengthening;Right;10 reps;Seated    General Comments General comments (skin integrity, edema, etc.): wound vac and hemovac intact beginning/end of session.  Pt agreeable to PT session.      Pertinent Vitals/Pain Pain Assessment: 0-10 Pain Score: 5  Pain Descriptors / Indicators: Sore;Tender Pain Intervention(s): Limited activity within patient's tolerance;Monitored during session;Premedicated before session;Repositioned  Vitals (HR and O2 on room air) stable and WFL throughout treatment session.    Home Living  Prior Function            PT Goals (current goals can now be found in the care plan section) Acute Rehab PT Goals Patient Stated Goal: to go home PT Goal Formulation: With patient Time For Goal Achievement: 04/24/18 Potential to Achieve Goals: Good Progress towards PT goals: Progressing toward goals    Frequency    BID      PT Plan Current plan remains appropriate     Co-evaluation              AM-PAC PT "6 Clicks" Daily Activity  Outcome Measure  Difficulty turning over in bed (including adjusting bedclothes, sheets and blankets)?: A Lot Difficulty moving from lying on back to sitting on the side of the bed? : Unable Difficulty sitting down on and standing up from a chair with arms (e.g., wheelchair, bedside commode, etc,.)?: Unable Help needed moving to and from a bed to chair (including a wheelchair)?: A Little Help needed walking in hospital room?: A Little Help needed climbing 3-5 steps with a railing? : A Lot 6 Click Score: 12    End of Session Equipment Utilized During Treatment: Gait belt Activity Tolerance: Patient tolerated treatment well Patient left: in bed;with call bell/phone within reach;with bed alarm set;with family/visitor present;with SCD's reapplied;Other (comment)(B heels elevated via towel rolls) Nurse Communication: Mobility status;Precautions;Weight bearing status;Other (comment)(via white board) PT Visit Diagnosis: Other abnormalities of gait and mobility (R26.89);Muscle weakness (generalized) (M62.81);Difficulty in walking, not elsewhere classified (R26.2);Pain Pain - Right/Left: Right Pain - part of body: Hip     Time: 1430-1457 PT Time Calculation (min) (ACUTE ONLY): 27 min  Charges:  $Gait Training: 8-22 mins $Therapeutic Activity: 8-22 mins                    Leitha Bleak, PT 04/10/18, 4:46 PM [phone]

## 2018-04-10 NOTE — Evaluation (Signed)
Physical Therapy Evaluation Patient Details Name: Patricia Richardson MRN: [id number] DOB: 08-May-1951 Today's Date: 04/10/2018   History of Present Illness  Pt is a 67 y.o female s/p R THA anterior approach 04/09/18.  PMH includes htn, TIA, depression, and OA.  Clinical Impression  Prior to hospital admission, pt was independent with ambulation.  Pt lives alone in 1 level home with steps to enter; family able to assist intermittently.  Currently pt is min assist semi-supine to sit; CGA to min assist with transfers; and CGA ambulating 10 feet with youth sized RW.  Vc's required for transfer and gait technique with walker use.  Pain 3/10 R hip beginning of session and 4/10 end of session resting in chair.  Pt would benefit from skilled PT to address noted impairments and functional limitations (see below for any additional details).  Upon hospital discharge, recommend pt discharge to home with HHPT and support of family.    Follow Up Recommendations Home health PT    Equipment Recommendations  Rolling walker with 5" wheels(youth sized (pt's family to bring in family walker to check if appropriate fit))    Recommendations for Other Services OT consult     Precautions / Restrictions Precautions Precautions: Fall;Anterior Hip Precaution Booklet Issued: Yes (comment) Precaution Comments: wound vac; hemovac Restrictions Weight Bearing Restrictions: Yes RLE Weight Bearing: Weight bearing as tolerated      Mobility  Bed Mobility Overal bed mobility: Needs Assistance Bed Mobility: Supine to Sit     Supine to sit: Min assist;HOB elevated     General bed mobility comments: assist for R LE; vc's for use of bed rail; increased effort and time for pt to perform  Transfers Overall transfer level: Needs assistance Equipment used: Rolling walker (2 wheeled)(youth sized) Transfers: Sit to/from [facility] Sit to Stand: Min assist;Min guard Stand pivot transfers: Min guard(bed to  recliner)       General transfer comment: min assist to initiate and come to full stand from bed and CGA to stand from recliner; vc's for UE and LE placement/technique  Ambulation/Gait Ambulation/Gait assistance: Min guard Gait Distance (Feet): 10 Feet Assistive device: Rolling walker (2 wheeled)(youth sized) Gait Pattern/deviations: Antalgic Gait velocity: decreased   General Gait Details: decreased stance time R LE; vc's to increase UE support through RW; step to gait pattern  Stairs            Wheelchair Mobility    Modified Rankin (Stroke Patients Only)       Balance Overall balance assessment: Needs assistance Sitting-balance support: No upper extremity supported;Feet supported Sitting balance-Leahy Scale: Good Sitting balance - Comments: steady sitting reaching within BOS   Standing balance support: Single extremity supported Standing balance-Leahy Scale: Poor Standing balance comment: requires at least single UE support for static standing balance                             Pertinent Vitals/Pain Pain Assessment: 0-10 Pain Score: 4  Pain Location: R hip Pain Descriptors / Indicators: Sore;Tender Pain Intervention(s): Limited activity within patient's tolerance;Monitored during session;Premedicated before session;Repositioned  Vitals (HR and O2 on room air) stable and WFL throughout treatment session.    Home Living Family/patient expects to be discharged to:: Private residence Living Arrangements: Alone Available Help at Discharge: Family;Available PRN/intermittently Type of Home: House Home Access: Stairs to enter   [employer] of Steps: 2 steps with R railing and then small threshold to enter home  Home Layout: One level Home Equipment: Cane - single point;Grab bars - tub/shower;Shower seat - built in(RW from pt's mom)      Prior Function Level of Independence: Independent         Comments: Pt reports no falls in past 6  months.     Hand Dominance        Extremity/Trunk Assessment   Upper Extremity Assessment Upper Extremity Assessment: Overall WFL for tasks assessed    Lower Extremity Assessment Lower Extremity Assessment: RLE deficits/detail;LLE deficits/detail RLE Deficits / Details: hip flexion at least 2+/5 (limited d/t pain); knee flexion/extension and DF/PF at least 3/5 AROM RLE: Unable to fully assess due to pain LLE Deficits / Details: strength and ROM WFL    Cervical / Trunk Assessment Cervical / Trunk Assessment: Normal  Communication   Communication: No difficulties  Cognition Arousal/Alertness: Awake/alert Behavior During Therapy: WFL for tasks assessed/performed Overall Cognitive Status: Within Functional Limits for tasks assessed                                        General Comments General comments (skin integrity, edema, etc.): wound vac and hemovac intact beginning/end of session.  Pt agreeable to PT session.  Pt's daughter present during session.    Exercises Total Joint Exercises Ankle Circles/Pumps: AROM;Strengthening;Both;10 reps;Supine Quad Sets: AROM;Strengthening;Both;10 reps;Supine Gluteal Sets: AROM;Strengthening;Both;10 reps;Supine Towel Squeeze: AROM;Strengthening;Both;10 reps;Supine Short Arc Quad: AAROM;Strengthening;Right;10 reps;Supine Heel Slides: AAROM;Strengthening;Right;10 reps;Supine   Assessment/Plan    PT Assessment Patient needs continued PT services  PT Problem List Decreased strength;Decreased range of motion;Decreased activity tolerance;Decreased balance;Decreased mobility;Decreased knowledge of use of DME;Decreased knowledge of precautions;Pain;Decreased skin integrity       PT Treatment Interventions DME instruction;Gait training;Stair training;Functional mobility training;Therapeutic activities;Therapeutic exercise;Balance training;Patient/family education    PT Goals (Current goals can be found in the Care Plan  section)  Acute Rehab PT Goals Patient Stated Goal: to go home PT Goal Formulation: With patient Time For Goal Achievement: 04/24/18 Potential to Achieve Goals: Good    Frequency BID   Barriers to discharge        Co-evaluation               AM-PAC PT "6 Clicks" Daily Activity  Outcome Measure Difficulty turning over in bed (including adjusting bedclothes, sheets and blankets)?: A Lot Difficulty moving from lying on back to sitting on the side of the bed? : Unable Difficulty sitting down on and standing up from a chair with arms (e.g., wheelchair, bedside commode, etc,.)?: Unable Help needed moving to and from a bed to chair (including a wheelchair)?: A Little Help needed walking in hospital room?: A Little Help needed climbing 3-5 steps with a railing? : A Lot 6 Click Score: 12    End of Session Equipment Utilized During Treatment: Gait belt Activity Tolerance: Patient tolerated treatment well Patient left: in chair;with call bell/phone within reach;with family/visitor present;with SCD's reapplied;Other (comment)(B heels elevated via pillow; no chair alarm pad available (nurse notified; pt and pt's daughter educated to call and wait for staff assist prior to getting up: both verbalizing good understanding)) Nurse Communication: Mobility status;Precautions;Weight bearing status;Other (comment)(Pt's pain status) PT Visit Diagnosis: Other abnormalities of gait and mobility (R26.89);Muscle weakness (generalized) (M62.81);Difficulty in walking, not elsewhere classified (R26.2);Pain Pain - Right/Left: Right Pain - part of body: Hip    Time: 1914-7829 PT Time Calculation (min) (ACUTE ONLY): 45 min  Charges:   PT Evaluation $PT Eval Low Complexity: 1 Low PT Treatments $Therapeutic Exercise: 8-22 mins $Therapeutic Activity: 8-22 mins       Leitha Bleak, PT 04/10/18, 9:45 AM [phone]

## 2018-04-11 LAB — BASIC METABOLIC PANEL
Anion gap: 10 (ref 5–15)
BUN: 14 mg/dL (ref 8–23)
CALCIUM: 8.6 mg/dL — AB (ref 8.9–10.3)
CO2: 24 mmol/L (ref 22–32)
Chloride: 105 mmol/L (ref 98–111)
Creatinine, Ser: 0.69 mg/dL (ref 0.44–1.00)
GFR calc Af Amer: >60 mL/min (ref >60)
GFR calc non Af Amer: >60 mL/min (ref >60)
GLUCOSE: 139 mg/dL — AB (ref 70–99)
Potassium: 4 mmol/L (ref 3.5–5.1)
Sodium: 139 mmol/L (ref 135–145)

## 2018-04-11 LAB — CBC
HCT: 36.5 % (ref 36.0–46.0)
Hemoglobin: 11.9 g/dL — ABNORMAL LOW (ref 12.0–15.0)
MCH: 29.5 pg (ref 26.0–34.0)
MCHC: 32.6 g/dL (ref 30.0–36.0)
MCV: 90.3 fL (ref 80.0–100.0)
PLATELETS: 303 10*3/uL (ref 150–400)
RBC: 4.04 MIL/uL (ref 3.87–5.11)
RDW: 13.4 % (ref 11.5–15.5)
WBC: 11.2 10*3/uL — ABNORMAL HIGH (ref 4.0–10.5)
nRBC: 0 % (ref 0.0–0.2)

## 2018-04-11 MED ORDER — BISACODYL 10 MG RE SUPP
10.0000 mg | Freq: Once | RECTAL | Status: AC
  Administered 2018-04-11: 10 mg via RECTAL
  Filled 2018-04-11: qty 1

## 2018-04-11 MED ORDER — OXYCODONE HCL 5 MG PO TABS: 5.0000 mg | ORAL_TABLET | ORAL | 0 refills | Status: DC | PRN

## 2018-04-11 MED ORDER — DOCUSATE SODIUM 100 MG PO CAPS: 100.0000 mg | ORAL_CAPSULE | Freq: Two times a day (BID) | ORAL | 0 refills | Status: DC

## 2018-04-11 MED ORDER — ASPIRIN 81 MG PO CHEW: 81.0000 mg | CHEWABLE_TABLET | Freq: Two times a day (BID) | ORAL | 0 refills | Status: AC

## 2018-04-11 MED ORDER — ACETAMINOPHEN 500 MG PO TABS: 500.0000 mg | ORAL_TABLET | Freq: Four times a day (QID) | ORAL | Status: DC | PRN

## 2018-04-11 NOTE — Progress Notes (Signed)
Clinical Social Worker (CSW) received SNF consult. PT is recommending home health. RN case manager aware of above. Please reconsult if future social work needs arise. CSW signing off.   Arch Methot, LCSW [phone] 

## 2018-04-11 NOTE — Discharge Summary (Signed)
Physician Discharge Summary  Patient ID: Patricia Richardson MRN: [id number] DOB/AGE: 08-05-1950 67 y.o.  Admit date: 04/09/2018 Discharge date: 04/11/2018  Admission Diagnoses:  primary osteoarthritis right hip   Discharge Diagnoses: Patient Active Problem List   Diagnosis Date Noted  . 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 Arthritis   . Cystocele   . Depression   . GERD (gastroesophageal reflux disease)   . History of chicken pox   . Hypertension   . 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 female who was admitted 04/09/2018 with a diagnosis of right hip osteoarthritis and went to the operating room on 04/09/2018 and underwent the above named procedures.    Surgeries: Procedure(s): TOTAL HIP ARTHROPLASTY ANTERIOR APPROACH on 04/09/2018 Patient tolerated the surgery well. Taken to PACU where she was stabilized and then transferred to the orthopedic floor.  Started on aspirin, teds, SCDs. Heels elevated on bed with rolled towels. No evidence of DVT. Negative Homan. Physical therapy started on day #1 for gait training and transfer. OT started day #1 for ADL and assisted devices.  Patient's foley was d/c on day #1. Patient's IV  was d/c on day #2.  On post op day #2 patient was stable and ready for discharge to home with home health PT.  Implants: Medacta AMIS to lateralized stem with S metal 28 mm head, 50 mm Mpact DM cup and liner  She was given perioperative antibiotics:  Anti-infectives (From admission, onward)   Start     Dose/Rate Route Frequency Ordered Stop   04/09/18 1800  ceFAZolin (ANCEF) IVPB 2g/100 mL premix     2 g 200 mL/hr over 30 Minutes Intravenous Every 6 hours 04/09/18 1644 04/10/18 0050   04/09/18 1016  ceFAZolin (ANCEF) 2-4 GM/100ML-% IVPB    Note to  Pharmacy:  Phineas Real   : cabinet override      04/09/18 1016 04/09/18 1305   04/08/18 2145  ceFAZolin (ANCEF) IVPB 2g/100 mL premix     2 g 200 mL/hr over 30 Minutes Intravenous  Once 04/08/18 2142 04/09/18 1305    .  She was given sequential compression devices, early ambulation, and aspirin, teds for DVT prophylaxis.  She benefited maximally from the hospital stay and there were no complications.    Recent vital signs:  Vitals:   04/10/18 2048 04/10/18 2349  BP: 138/60 132/85  Pulse: 89 (!) 106  Resp: 16 18  Temp: 97.7 F (36.5 C) (!) 97.4 F (36.3 C)  SpO2: 95% 97%    Recent laboratory studies:  Lab Results  Component Value Date   HGB 11.9 (L) 04/11/2018   HGB 12.7 04/10/2018   HGB 14.0 03/29/2018   Lab Results  Component Value Date   WBC 11.2 (H) 04/11/2018   PLT 303 04/11/2018   Lab Results  Component Value Date   INR 0.93 04/09/2018   Lab Results  Component Value Date   NA 139 04/11/2018   K 4.0 04/11/2018   CL 105 04/11/2018   CO2 24 04/11/2018   BUN 14 04/11/2018   CREATININE 0.69 04/11/2018   GLUCOSE 139 (H) 04/11/2018    Discharge Medications:   Allergies as of 04/11/2018      Reactions   Codeine Itching   Pin  and needles       Medication List    STOP taking these medications   ibuprofen 200 MG tablet Commonly known as:  ADVIL,MOTRIN     TAKE these medications   acetaminophen 500 MG tablet Commonly known as:  TYLENOL Take 1-2 tablets (500-1,000 mg total) by mouth every 6 (six) hours as needed for mild pain (pain score 1-3 or temp > 100.5).   aspirin 81 MG chewable tablet Chew 1 tablet (81 mg total) by mouth 2 (two) times daily.   docusate sodium 100 MG capsule Commonly known as:  COLACE Take 1 capsule (100 mg total) by mouth 2 (two) times daily.   latanoprost 0.005 % ophthalmic solution Commonly known as:  XALATAN Place 1 drop into both eyes at bedtime.   omeprazole 20 MG capsule Commonly known as:  PRILOSEC Take 20 mg  by mouth daily as needed for heartburn.   OVER THE COUNTER MEDICATION Take 1 tablet by mouth daily as needed (constipation). Swiss Kriss herbal laxative   oxyCODONE 5 MG immediate release tablet Commonly known as:  Oxy IR/ROXICODONE Take 1-2 tablets (5-10 mg total) by mouth every 4 (four) hours as needed for moderate pain (pain score 4-6).            Durable Medical Equipment  (From admission, onward)         Start     Ordered   04/09/18 1645  DME 3 n 1  Once     04/09/18 1644   04/09/18 1645  DME Bedside commode  Once    Question:  Patient needs a bedside commode to treat with the following condition  Answer:  Status post total hip replacement, right   04/09/18 1644   04/09/18 1645  DME Walker rolling  Once    Question:  Patient needs a walker to treat with the following condition  Answer:  Status post total hip replacement, right   04/09/18 1644          Diagnostic Studies: Dg Hip Operative Unilat W Or W/o Pelvis Right  Result Date: 04/09/2018 CLINICAL DATA:  RIGHT hip arthroplasty EXAM: OPERATIVE RIGHT HIP (WITH PELVIS IF PERFORMED) 2 VIEWS TECHNIQUE: Fluoroscopic spot image(s) were submitted for interpretation post-operatively. COMPARISON:  None. FINDINGS: Intraoperative spot films of the RIGHT hip are submitted postoperatively for interpretation. RIGHT hip arthroplasty identified without definite complicating features. IMPRESSION: RIGHT hip arthroplasty without definite complicating features. Electronically Signed   By: Margarette Canada M.D.   On: 04/09/2018 14:37   Dg Hip Unilat W Or W/o Pelvis 2-3 Views Right  Result Date: 04/09/2018 CLINICAL DATA:  Postop EXAM: DG HIP (WITH OR WITHOUT PELVIS) 2-3V RIGHT COMPARISON:  None. FINDINGS: Patient is status post right hip replacement with normal alignment of components. Pubic symphysis and right rami appear intact. Cutaneous staples over the right hip along with surgical drain. Small amount of gas within the right hip soft tissues.  IMPRESSION: Status post right hip replacement with expected postsurgical changes Electronically Signed   By: Donavan Foil M.D.   On: 04/09/2018 15:10    Disposition: Discharge disposition: 01-Home or Self Care         Follow-up Information    Duanne Guess, PA-C Follow up in 2 week(s).   Specialties:  Orthopedic Surgery, Emergency Medicine Contact information: [place] [address] [phone]            Signed: Feliberto Gottron 04/11/2018, 7:19 AM

## 2018-04-11 NOTE — Progress Notes (Signed)
   Subjective: 2 Days Post-Op Procedure(s) (LRB): TOTAL HIP ARTHROPLASTY ANTERIOR APPROACH (Right) Patient reports pain as mild.   Patient is well, and has had no acute complaints or problems Denies any CP, SOB, cough, back pain, ABD pain. We will continue therapy today.  Plan is to go Home after hospital stay.  Objective: Vital signs in last 24 hours: Temp:  [97.4 F (36.3 C)-98.2 F (36.8 C)] 97.4 F (36.3 C) (11/06 2349) Pulse Rate:  [86-106] 106 (11/06 2349) Resp:  [16-18] 18 (11/06 2349) BP: (112-138)/(60-85) 132/85 (11/06 2349) SpO2:  [95 %-98 %] 97 % (11/06 2349)  Intake/Output from previous day: 11/06 0701 - 11/07 0700 In: 240 [P.O.:220] Out: -  Intake/Output this shift: No intake/output data recorded.  Recent Labs    04/10/18 0327 04/11/18 0405  HGB 12.7 11.9*   Recent Labs    04/10/18 0327 04/11/18 0405  WBC 16.0* 11.2*  RBC 4.21 4.04  HCT 38.1 36.5  PLT 310 303   Recent Labs    04/10/18 0327 04/11/18 0405  NA 140 139  K 4.2 4.0  CL 108 105  CO2 24 24  BUN 12 14  CREATININE 0.60 0.69  GLUCOSE 144* 139*  CALCIUM 8.8* 8.6*   Recent Labs    04/09/18 1214  INR 0.93    EXAM General - Patient is Alert, Appropriate and Oriented Extremity - Neurovascular intact Sensation intact distally Intact pulses distally Dorsiflexion/Plantar flexion intact No cellulitis present Compartment soft Dressing - dressing C/D/I and no drainage.  Hemovac and wound VAC intact.  Hemovac removed today. Motor Function - intact, moving foot and toes well on exam.   Past Medical History:  Diagnosis Date  . 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 Arthritis   . Cystocele   . Depression   . GERD (gastroesophageal reflux disease)   . History of chicken pox   . Hypertension   . 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   Weight as of 03/29/18: 88.5 kg. Advance diet Up with therapy  Pain well controlled Vital signs are stable Labs are stable Care management to assist with discharge to home with home health PT today pending bowel movement and completion of physical therapy goals   DVT Prophylaxis - Aspirin, TED hose and SCDs Weight-Bearing as tolerated to right leg   T. Rachelle Hora, PA-C [place] 04/11/2018, 7:13 AM

## 2018-04-11 NOTE — Discharge Instructions (Signed)
ANTERIOR APPROACH TOTAL HIP REPLACEMENT POSTOPERATIVE DIRECTIONS   Hip Rehabilitation, Guidelines Following Surgery  The results of a hip operation are greatly improved after range of motion and muscle strengthening exercises. Follow all safety measures which are given to protect your hip. If any of these exercises cause increased pain or swelling in your joint, decrease the amount until you are comfortable again. Then slowly increase the exercises. Call your caregiver if you have problems or questions.   HOME CARE INSTRUCTIONS  Remove items at home which could result in a fall. This includes throw rugs or furniture in walking pathways.   ICE to the affected hip every three hours for 30 minutes at a time and then as needed for pain and swelling.  Continue to use ice on the hip for pain and swelling from surgery. You may notice swelling that will progress down to the foot and ankle.  This is normal after surgery.  Elevate the leg when you are not up walking on it.    Continue to use the breathing machine which will help keep your temperature down.  It is common for your temperature to cycle up and down following surgery, especially at night when you are not up moving around and exerting yourself.  The breathing machine keeps your lungs expanded and your temperature down.  Do not place pillow under knee, focus on keeping the knee straight while resting  DIET You may resume your previous home diet once your are discharged from the hospital.  DRESSING / WOUND CARE / SHOWERING Please remove provena negative pressure dressing on 04/18/2018 and apply honey comb dressing. Keep dressing clean and dry at all times.  ACTIVITY Walk with your walker as instructed. Use walker as long as suggested by your caregivers. Avoid periods of inactivity such as sitting longer than an hour when not asleep. This helps prevent blood clots.  You may resume a sexual relationship in one month or when given the OK by  your doctor.  You may return to work once you are cleared by your doctor.  Do not drive a car for 6 weeks or until released by you surgeon.  Do not drive while taking narcotics.  WEIGHT BEARING Weight bearing as tolerated. Use walker/cane as needed for at least 4 weeks post op.  POSTOPERATIVE CONSTIPATION PROTOCOL Constipation - defined medically as fewer than three stools per week and severe constipation as less than one stool per week.  One of the most common issues patients have following surgery is constipation.  Even if you have a regular bowel pattern at home, your normal regimen is likely to be disrupted due to multiple reasons following surgery.  Combination of anesthesia, postoperative narcotics, change in appetite and fluid intake all can affect your bowels.  In order to avoid complications following surgery, here are some recommendations in order to help you during your recovery period.  Colace (docusate) - Pick up an over-the-counter form of Colace or another stool softener and take twice a day as long as you are requiring postoperative pain medications.  Take with a full glass of water daily.  If you experience loose stools or diarrhea, hold the colace until you stool forms back up.  If your symptoms do not get better within 1 week or if they get worse, check with your doctor.  Dulcolax (bisacodyl) - Pick up over-the-counter and take as directed by the product packaging as needed to assist with the movement of your bowels.  Take with a full  glass of water.  Use this product as needed if not relieved by Colace only.  ° °MiraLax (polyethylene glycol) - Pick up over-the-counter to have on hand.  MiraLax is a solution that will increase the amount of water in your bowels to assist with bowel movements.  Take as directed and can mix with a glass of water, juice, soda, coffee, or tea.  Take if you go more than two days without a movement. °Do not use MiraLax more than once per day. Call your  doctor if you are still constipated or irregular after using this medication for 7 days in a row. ° °If you continue to have problems with postoperative constipation, please contact the office for further assistance and recommendations.  If you experience "the worst abdominal pain ever" or develop nausea or vomiting, please contact the office immediatly for further recommendations for treatment. ° °ITCHING ° If you experience itching with your medications, try taking only a single pain pill, or even half a pain pill at a time.  You can also use Benadryl over the counter for itching or also to help with sleep.  ° °TED HOSE STOCKINGS °Wear the elastic stockings on both legs for six weeks following surgery during the day but you may remove then at night for sleeping. ° °MEDICATIONS °See your medication summary on the “After Visit Summary” that the nursing staff will review with you prior to discharge.  You may have some home medications which will be placed on hold until you complete the course of blood thinner medication.  It is important for you to complete the blood thinner medication as prescribed by your surgeon.  Continue your approved medications as instructed at time of discharge. ° °PRECAUTIONS °If you experience chest pain or shortness of breath - call 911 immediately for transfer to the hospital emergency department.  °If you develop a fever greater that 101 F, purulent drainage from wound, increased redness or drainage from wound, foul odor from the wound/dressing, or calf pain - CONTACT YOUR SURGEON.   °                                                °FOLLOW-UP APPOINTMENTS °Make sure you keep all of your appointments after your operation with your surgeon and caregivers. You should call the office at the above phone number and make an appointment for approximately two weeks after the date of your surgery or on the date instructed by your surgeon outlined in the "After Visit Summary". ° °RANGE OF MOTION  AND STRENGTHENING EXERCISES  °These exercises are designed to help you keep full movement of your hip joint. Follow your caregiver's or physical therapist's instructions. Perform all exercises about fifteen times, three times per day or as directed. Exercise both hips, even if you have had only one joint replacement. These exercises can be done on a training (exercise) mat, on the floor, on a table or on a bed. Use whatever works the best and is most comfortable for you. Use music or television while you are exercising so that the exercises are a pleasant break in your day. This will make your life better with the exercises acting as a break in routine you can look forward to.  °Lying on your back, slowly slide your foot toward your buttocks, raising your knee up off the floor. Then slowly   slide your foot back down until your leg is straight again.  °Lying on your back spread your legs as far apart as you can without causing discomfort.  °Lying on your side, raise your upper leg and foot straight up from the floor as far as is comfortable. Slowly lower the leg and repeat.  °Lying on your back, tighten up the muscle in the front of your thigh (quadriceps muscles). You can do this by keeping your leg straight and trying to raise your heel off the floor. This helps strengthen the largest muscle supporting your knee.  °Lying on your back, tighten up the muscles of your buttocks both with the legs straight and with the knee bent at a comfortable angle while keeping your heel on the floor.  ° °IF YOU ARE TRANSFERRED TO A SKILLED REHAB FACILITY °If the patient is transferred to a skilled rehab facility following release from the hospital, a list of the current medications will be sent to the facility for the patient to continue.  When discharged from the skilled rehab facility, please have the facility set up the patient's Home Health Physical Therapy prior to being released. Also, the skilled facility will be responsible  for providing the patient with their medications at time of release from the facility to include their pain medication, the muscle relaxants, and their blood thinner medication. If the patient is still at the rehab facility at time of the two week follow up appointment, the skilled rehab facility will also need to assist the patient in arranging follow up appointment in our office and any transportation needs. ° °MAKE SURE YOU:  °Understand these instructions.  °Get help right away if you are not doing well or get worse.  ° ° °Pick up stool softner and laxative for home use following surgery while on pain medications. °Continue to use ice for pain and swelling after surgery. °Do not use any lotions or creams on the incision until instructed by your surgeon. ° °

## 2018-04-11 NOTE — Progress Notes (Signed)
Discharge instructions and prescriptions given to pt. IV removed. Pt's home walker and BSC delivered to room. Pt up to bathroom and had BM. Pt dressed and will discharge home with daughter.

## 2018-04-11 NOTE — Progress Notes (Signed)
Physical Therapy Treatment Patient Details Name: Patricia Richardson MRN: [id number] DOB: Apr 12, 1951 Today's Date: 04/11/2018    History of Present Illness Pt is a 67 y.o female s/p R THA anterior approach 04/09/18.  PMH includes htn, TIA, depression, and OA.    PT Comments    Pt SBA with transfers and CGA to SBA ambulating around nursing loop with youth sized rolling walker.  Pt able to navigate stairs safely with UE support and CGA.  Pt does need assist with R LE in/out of bed but pt reports she plans to sleep in recliner initially upon home discharge and will have assist as needed.  Pt steady and safe throughout PT session.  Pain 0/10 R hip beginning of session and 1/10 end of session.  Pt appears safe to discharge home with HHPT and support of family (nurse notified).   Follow Up Recommendations  Home health PT     Equipment Recommendations  Rolling walker with 5" wheels(youth sized)    Recommendations for Other Services OT consult     Precautions / Restrictions Precautions Precautions: Fall;Anterior Hip Precaution Booklet Issued: Yes (comment) Precaution Comments: wound vac; hemovac Restrictions Weight Bearing Restrictions: Yes RLE Weight Bearing: Weight bearing as tolerated    Mobility  Bed Mobility Overal bed mobility: Needs Assistance Bed Mobility: Supine to Sit;Sit to Supine     Supine to sit: Min assist Sit to supine: Min assist   General bed mobility comments: assist for R LE; vc's for technique  Transfers Overall transfer level: Needs assistance Equipment used: Rolling walker (2 wheeled) Transfers: Sit to/from Stand Sit to Stand: Supervision Stand pivot transfers: Supervision       General transfer comment: steady safe sit to/from stands with RW use from bed and recliner  Ambulation/Gait Ambulation/Gait assistance: Min guard;Supervision Gait Distance (Feet): (30 feet; 200 feet) Assistive device: Rolling walker (2 wheeled)(youth sized) Gait  Pattern/deviations: Antalgic Gait velocity: decreased   General Gait Details: decreased stance time R LE; initial vc's to increase UE support through RW; good positioning within walker; step to gait pattern   Stairs Stairs: Yes Stairs assistance: Min guard Stair Management: One rail Right;With walker Number of Stairs: 4 General stair comments: ascended/descended 3 steps with R railing (sidestepping) and 1 step with RW (forwards); initial vc's and demo for technique and then pt able to perform without any further cueing; pt and pt's daughter verbalizing understanding (pt's daughter reports she will have 2 assist for safety to get pt into home today)   Wheelchair Mobility    Modified Rankin (Stroke Patients Only)       Balance Overall balance assessment: Needs assistance Sitting-balance support: No upper extremity supported;Feet supported Sitting balance-Leahy Scale: Normal Sitting balance - Comments: steady sitting reaching outside BOS   Standing balance support: No upper extremity supported Standing balance-Leahy Scale: Good Standing balance comment: steady standing managing clothing prior to sitting down on toilet                            Cognition Arousal/Alertness: Awake/alert Behavior During Therapy: WFL for tasks assessed/performed Overall Cognitive Status: Within Functional Limits for tasks assessed                                        Exercises      General Comments General comments (skin integrity, edema, etc.): wound vac intact  beginning/end of session.  Pt agreeable to PT session.  Pt's daughter present during session.      Pertinent Vitals/Pain Pain Assessment: 0-10 Pain Score: 1  Pain Location: R hip Pain Descriptors / Indicators: Sore;Tightness Pain Intervention(s): Limited activity within patient's tolerance;Monitored during session;Premedicated before session;Repositioned  Vitals (HR and O2 on room air) stable and WFL  throughout treatment session.    Home Living                      Prior Function            PT Goals (current goals can now be found in the care plan section) Acute Rehab PT Goals Patient Stated Goal: to get back outside and do yardwork PT Goal Formulation: With patient Time For Goal Achievement: 04/24/18 Potential to Achieve Goals: Good Progress towards PT goals: Progressing toward goals    Frequency    BID      PT Plan Current plan remains appropriate    Co-evaluation              AM-PAC PT "6 Clicks" Daily Activity  Outcome Measure  Difficulty turning over in bed (including adjusting bedclothes, sheets and blankets)?: A Lot Difficulty moving from lying on back to sitting on the side of the bed? : Unable Difficulty sitting down on and standing up from a chair with arms (e.g., wheelchair, bedside commode, etc,.)?: A Little Help needed moving to and from a bed to chair (including a wheelchair)?: A Little Help needed walking in hospital room?: A Little Help needed climbing 3-5 steps with a railing? : A Little 6 Click Score: 15    End of Session Equipment Utilized During Treatment: Gait belt Activity Tolerance: Patient tolerated treatment well Patient left: (on toilet (nurse present and reporting ok to leave pt on toilet alone; pt educated to pull "pull" cord and wait for staff assist prior to getting up--pt verbalizing good understanding and family present in room)) Nurse Communication: Mobility status;Precautions;Weight bearing status PT Visit Diagnosis: Other abnormalities of gait and mobility (R26.89);Muscle weakness (generalized) (M62.81);Difficulty in walking, not elsewhere classified (R26.2);Pain Pain - Right/Left: Right Pain - part of body: Hip     Time: 0915-0956 PT Time Calculation (min) (ACUTE ONLY): 41 min  Charges:  $Gait Training: 23-37 mins $Therapeutic Activity: 8-22 mins                    Leitha Bleak, PT 04/11/18, 10:12  AM [phone]

## 2018-04-11 NOTE — Anesthesia Postprocedure Evaluation (Signed)
Anesthesia Post Note  Patient: Patricia Richardson  Procedure(s) Performed: TOTAL HIP ARTHROPLASTY ANTERIOR APPROACH (Right Hip)  Patient location during evaluation: PACU Anesthesia Type: Spinal Level of consciousness: awake and alert and oriented Pain management: pain level controlled Vital Signs Assessment: post-procedure vital signs reviewed and stable Respiratory status: spontaneous breathing Cardiovascular status: blood pressure returned to baseline Anesthetic complications: no     Last Vitals:  Vitals:   04/10/18 2349 04/11/18 0752  BP: 132/85 137/66  Pulse: (!) 106 96  Resp: 18 18  Temp: (!) 36.3 C 36.8 C  SpO2: 97% 98%    Last Pain:  Vitals:   04/11/18 1512  TempSrc:   PainSc: 4                  Zariel Capano

## 2018-04-12 LAB — SURGICAL PATHOLOGY

## 2018-04-13 DIAGNOSIS — Z7982 Long term (current) use of aspirin: Secondary | ICD-10-CM | POA: Diagnosis not present

## 2018-04-13 DIAGNOSIS — Z96641 Presence of right artificial hip joint: Secondary | ICD-10-CM | POA: Diagnosis not present

## 2018-04-13 DIAGNOSIS — H499 Unspecified paralytic strabismus: Secondary | ICD-10-CM | POA: Diagnosis not present

## 2018-04-13 DIAGNOSIS — I10 Essential (primary) hypertension: Secondary | ICD-10-CM | POA: Diagnosis not present

## 2018-04-13 DIAGNOSIS — F329 Major depressive disorder, single episode, unspecified: Secondary | ICD-10-CM | POA: Diagnosis not present

## 2018-04-13 DIAGNOSIS — Z9181 History of falling: Secondary | ICD-10-CM | POA: Diagnosis not present

## 2018-04-13 DIAGNOSIS — Z471 Aftercare following joint replacement surgery: Secondary | ICD-10-CM | POA: Diagnosis not present

## 2018-04-13 DIAGNOSIS — Z86718 Personal history of other venous thrombosis and embolism: Secondary | ICD-10-CM | POA: Diagnosis not present

## 2018-05-03 DIAGNOSIS — Z9181 History of falling: Secondary | ICD-10-CM | POA: Diagnosis not present

## 2018-05-03 DIAGNOSIS — I10 Essential (primary) hypertension: Secondary | ICD-10-CM | POA: Diagnosis not present

## 2018-05-03 DIAGNOSIS — H499 Unspecified paralytic strabismus: Secondary | ICD-10-CM | POA: Diagnosis not present

## 2018-05-03 DIAGNOSIS — F329 Major depressive disorder, single episode, unspecified: Secondary | ICD-10-CM | POA: Diagnosis not present

## 2018-05-03 DIAGNOSIS — Z96641 Presence of right artificial hip joint: Secondary | ICD-10-CM | POA: Diagnosis not present

## 2018-05-03 DIAGNOSIS — Z7982 Long term (current) use of aspirin: Secondary | ICD-10-CM | POA: Diagnosis not present

## 2018-05-03 DIAGNOSIS — Z86718 Personal history of other venous thrombosis and embolism: Secondary | ICD-10-CM | POA: Diagnosis not present

## 2018-05-03 DIAGNOSIS — Z471 Aftercare following joint replacement surgery: Secondary | ICD-10-CM | POA: Diagnosis not present

## 2018-05-06 DIAGNOSIS — I10 Essential (primary) hypertension: Secondary | ICD-10-CM | POA: Diagnosis not present

## 2018-05-06 DIAGNOSIS — Z96641 Presence of right artificial hip joint: Secondary | ICD-10-CM | POA: Diagnosis not present

## 2018-05-06 DIAGNOSIS — Z471 Aftercare following joint replacement surgery: Secondary | ICD-10-CM | POA: Diagnosis not present

## 2018-05-06 DIAGNOSIS — Z9181 History of falling: Secondary | ICD-10-CM | POA: Diagnosis not present

## 2018-05-06 DIAGNOSIS — Z7982 Long term (current) use of aspirin: Secondary | ICD-10-CM | POA: Diagnosis not present

## 2018-05-06 DIAGNOSIS — H499 Unspecified paralytic strabismus: Secondary | ICD-10-CM | POA: Diagnosis not present

## 2018-05-06 DIAGNOSIS — F329 Major depressive disorder, single episode, unspecified: Secondary | ICD-10-CM | POA: Diagnosis not present

## 2018-05-06 DIAGNOSIS — Z86718 Personal history of other venous thrombosis and embolism: Secondary | ICD-10-CM | POA: Diagnosis not present

## 2018-05-13 DIAGNOSIS — Z7982 Long term (current) use of aspirin: Secondary | ICD-10-CM | POA: Diagnosis not present

## 2018-05-13 DIAGNOSIS — H499 Unspecified paralytic strabismus: Secondary | ICD-10-CM | POA: Diagnosis not present

## 2018-05-13 DIAGNOSIS — Z9181 History of falling: Secondary | ICD-10-CM | POA: Diagnosis not present

## 2018-05-13 DIAGNOSIS — I10 Essential (primary) hypertension: Secondary | ICD-10-CM | POA: Diagnosis not present

## 2018-05-13 DIAGNOSIS — Z86718 Personal history of other venous thrombosis and embolism: Secondary | ICD-10-CM | POA: Diagnosis not present

## 2018-05-13 DIAGNOSIS — F329 Major depressive disorder, single episode, unspecified: Secondary | ICD-10-CM | POA: Diagnosis not present

## 2018-05-13 DIAGNOSIS — Z96641 Presence of right artificial hip joint: Secondary | ICD-10-CM | POA: Diagnosis not present

## 2018-05-13 DIAGNOSIS — Z471 Aftercare following joint replacement surgery: Secondary | ICD-10-CM | POA: Diagnosis not present

## 2018-05-24 DIAGNOSIS — M1711 Unilateral primary osteoarthritis, right knee: Secondary | ICD-10-CM | POA: Diagnosis not present

## 2018-05-24 DIAGNOSIS — M1611 Unilateral primary osteoarthritis, right hip: Secondary | ICD-10-CM | POA: Diagnosis not present

## 2018-06-17 ENCOUNTER — Encounter: Payer: Self-pay | Admitting: Family Medicine

## 2018-06-17 ENCOUNTER — Ambulatory Visit (INDEPENDENT_AMBULATORY_CARE_PROVIDER_SITE_OTHER): Payer: Medicare Other | Admitting: Family Medicine

## 2018-06-17 ENCOUNTER — Encounter (INDEPENDENT_AMBULATORY_CARE_PROVIDER_SITE_OTHER): Payer: Self-pay

## 2018-06-17 VITALS — BP 142/88 | HR 99 | Temp 97.9°F | Ht 62.0 in | Wt 192.0 lb

## 2018-06-17 DIAGNOSIS — D649 Anemia, unspecified: Secondary | ICD-10-CM | POA: Diagnosis not present

## 2018-06-17 DIAGNOSIS — R231 Pallor: Secondary | ICD-10-CM | POA: Diagnosis not present

## 2018-06-17 DIAGNOSIS — I10 Essential (primary) hypertension: Secondary | ICD-10-CM

## 2018-06-17 DIAGNOSIS — E669 Obesity, unspecified: Secondary | ICD-10-CM | POA: Insufficient documentation

## 2018-06-17 DIAGNOSIS — R739 Hyperglycemia, unspecified: Secondary | ICD-10-CM

## 2018-06-17 DIAGNOSIS — Z23 Encounter for immunization: Secondary | ICD-10-CM

## 2018-06-17 DIAGNOSIS — R82998 Other abnormal findings in urine: Secondary | ICD-10-CM

## 2018-06-17 DIAGNOSIS — Z8673 Personal history of transient ischemic attack (TIA), and cerebral infarction without residual deficits: Secondary | ICD-10-CM

## 2018-06-17 DIAGNOSIS — E2839 Other primary ovarian failure: Secondary | ICD-10-CM

## 2018-06-17 DIAGNOSIS — Z5181 Encounter for therapeutic drug level monitoring: Secondary | ICD-10-CM | POA: Diagnosis not present

## 2018-06-17 HISTORY — DX: Essential (primary) hypertension: I10

## 2018-06-17 NOTE — Assessment & Plan Note (Signed)
Encouraged weight loss today; goal BMI right now less than 30 to get out of obesity category; patient politely declined nutritionist referral

## 2018-06-17 NOTE — Patient Instructions (Addendum)
Check out the information at familydoctor.org entitled "Nutrition for Weight Loss: What You Need to Know about Fad Diets" Try to lose between 1-2 pounds per week by taking in fewer calories and burning off more calories You can succeed by limiting portions, limiting foods dense in calories and fat, becoming more active, and drinking 8 glasses of water a day (64 ounces) Don't skip meals, especially breakfast, as skipping meals may alter your metabolism Do not use over-the-counter weight loss pills or gimmicks that claim rapid weight loss A healthy BMI (or body mass index) is between 18.5 and 24.9  You can calculate your ideal BMI at the [place] website ClubMonetize.fr Try to follow the DASH guidelines (DASH stands for Dietary Approaches to Stop Hypertension). Try to limit the sodium in your diet to no more than 1,500mg  of sodium per day. Certainly try to not exceed 2,000 mg per day at the very most. Do not add salt when cooking or at the table.  Check the sodium amount on labels when shopping, and choose items lower in sodium when given a choice. Avoid or limit foods that already contain a lot of sodium. Eat a diet rich in fruits and vegetables and whole grains, and try to lose weight if overweight or obese  Monitor your blood pressure and let me know if not staying under 140 on top  Try to use PLAIN allergy medicine without the decongestant Avoid: phenylephrine, phenylpropanolamine, and pseudoephredine  If you need something for aches or pains, try to use Tylenol (acetaminophen) instead of non-steroidals (which include Aleve, ibuprofen, Advil, Motrin, and naproxen); non-steroidals can cause long-term kidney damage  Try to limit saturated fats in your diet (bologna, hot dogs, barbeque, cheeseburgers, hamburgers, steak, bacon, sausage, cheese, etc.) and get more fresh fruits, vegetables, and whole grains   DASH Eating Plan DASH stands for "Dietary  Approaches to Stop Hypertension." The DASH eating plan is a healthy eating plan that has been shown to reduce high blood pressure (hypertension). It may also reduce your risk for type 2 diabetes, heart disease, and stroke. The DASH eating plan may also help with weight loss. What are tips for following this plan?  General guidelines  Avoid eating more than 2,300 mg (milligrams) of salt (sodium) a day. If you have hypertension, you may need to reduce your sodium intake to 1,500 mg a day.  Limit alcohol intake to no more than 1 drink a day for nonpregnant women and 2 drinks a day for men. One drink equals 12 oz of beer, 5 oz of wine, or 1 oz of hard liquor.  Work with your health care provider to maintain a healthy body weight or to lose weight. Ask what an ideal weight is for you.  Get at least 30 minutes of exercise that causes your heart to beat faster (aerobic exercise) most days of the week. Activities may include walking, swimming, or biking.  Work with your health care provider or diet and nutrition specialist (dietitian) to adjust your eating plan to your individual calorie needs. Reading food labels   Check food labels for the amount of sodium per serving. Choose foods with less than 5 percent of the Daily Value of sodium. Generally, foods with less than 300 mg of sodium per serving fit into this eating plan.  To find whole grains, look for the word "whole" as the first word in the ingredient list. Shopping  Buy products labeled as "low-sodium" or "no salt added."  Buy fresh foods. Avoid canned  foods and premade or frozen meals. Cooking  Avoid adding salt when cooking. Use salt-free seasonings or herbs instead of table salt or sea salt. Check with your health care provider or pharmacist before using salt substitutes.  Do not fry foods. Cook foods using healthy methods such as baking, boiling, grilling, and broiling instead.  Cook with heart-healthy oils, such as olive, canola,  soybean, or sunflower oil. Meal planning  Eat a balanced diet that includes: ? 5 or more servings of fruits and vegetables each day. At each meal, try to fill half of your plate with fruits and vegetables. ? Up to 6-8 servings of whole grains each day. ? Less than 6 oz of lean meat, poultry, or fish each day. A 3-oz serving of meat is about the same size as a deck of cards. One egg equals 1 oz. ? 2 servings of low-fat dairy each day. ? A serving of nuts, seeds, or beans 5 times each week. ? Heart-healthy fats. Healthy fats called Omega-3 fatty acids are found in foods such as flaxseeds and coldwater fish, like sardines, salmon, and mackerel.  Limit how much you eat of the following: ? Canned or prepackaged foods. ? Food that is high in trans fat, such as fried foods. ? Food that is high in saturated fat, such as fatty meat. ? Sweets, desserts, sugary drinks, and other foods with added sugar. ? Full-fat dairy products.  Do not salt foods before eating.  Try to eat at least 2 vegetarian meals each week.  Eat more home-cooked food and less restaurant, buffet, and fast food.  When eating at a restaurant, ask that your food be prepared with less salt or no salt, if possible. What foods are recommended? The items listed may not be a complete list. Talk with your dietitian about what dietary choices are best for you. Grains Whole-grain or whole-wheat bread. Whole-grain or whole-wheat pasta. Brown rice. [place]. Bulgur. Whole-grain and low-sodium cereals. Pita bread. Low-fat, low-sodium crackers. Whole-wheat flour tortillas. Vegetables Fresh or frozen vegetables (raw, steamed, roasted, or grilled). Low-sodium or reduced-sodium tomato and vegetable juice. Low-sodium or reduced-sodium tomato sauce and tomato paste. Low-sodium or reduced-sodium canned vegetables. Fruits All fresh, dried, or frozen fruit. Canned fruit in natural juice (without added sugar). Meat and other protein  foods Skinless chicken or [place]. Ground chicken or [place]. Pork with fat trimmed off. Fish and seafood. Egg whites. Dried beans, peas, or lentils. Unsalted nuts, nut butters, and seeds. Unsalted canned beans. Lean cuts of beef with fat trimmed off. Low-sodium, lean deli meat. Dairy Low-fat (1%) or fat-free (skim) milk. Fat-free, low-fat, or reduced-fat cheeses. Nonfat, low-sodium ricotta or cottage cheese. Low-fat or nonfat yogurt. Low-fat, low-sodium cheese. Fats and oils Soft margarine without trans fats. Vegetable oil. Low-fat, reduced-fat, or light mayonnaise and salad dressings (reduced-sodium). Canola, safflower, olive, soybean, and sunflower oils. Avocado. Seasoning and other foods Herbs. Spices. Seasoning mixes without salt. Unsalted popcorn and pretzels. Fat-free sweets. What foods are not recommended? The items listed may not be a complete list. Talk with your dietitian about what dietary choices are best for you. Grains Baked goods made with fat, such as croissants, muffins, or some breads. Dry pasta or rice meal packs. Vegetables Creamed or fried vegetables. Vegetables in a cheese sauce. Regular canned vegetables (not low-sodium or reduced-sodium). Regular canned tomato sauce and paste (not low-sodium or reduced-sodium). Regular tomato and vegetable juice (not low-sodium or reduced-sodium). Angie Fava. Olives. Fruits Canned fruit in a light or heavy syrup. Maceo Pro  fruit. Fruit in cream or butter sauce. Meat and other protein foods Fatty cuts of meat. Ribs. Fried meat. Berniece Salines. Sausage. Bologna and other processed lunch meats. Salami. Fatback. Hotdogs. Bratwurst. Salted nuts and seeds. Canned beans with added salt. Canned or smoked fish. Whole eggs or egg yolks. Chicken or [place] with skin. Dairy Whole or 2% milk, cream, and half-and-half. Whole or full-fat cream cheese. Whole-fat or sweetened yogurt. Full-fat cheese. Nondairy creamers. Whipped toppings. Processed cheese and cheese  spreads. Fats and oils Butter. Stick margarine. Lard. Shortening. Ghee. Bacon fat. Tropical oils, such as coconut, palm kernel, or palm oil. Seasoning and other foods Salted popcorn and pretzels. Onion salt, garlic salt, seasoned salt, table salt, and sea salt. Worcestershire sauce. Tartar sauce. Barbecue sauce. Teriyaki sauce. Soy sauce, including reduced-sodium. Steak sauce. Canned and packaged gravies. Fish sauce. Oyster sauce. Cocktail sauce. Horseradish that you find on the shelf. Ketchup. Mustard. Meat flavorings and tenderizers. Bouillon cubes. Hot sauce and Tabasco sauce. Premade or packaged marinades. Premade or packaged taco seasonings. Relishes. Regular salad dressings. Where to find more information:  National Heart, Lung, and [place]: https://wilson-eaton.com/  American Heart Association: www.heart.org Summary  The DASH eating plan is a healthy eating plan that has been shown to reduce high blood pressure (hypertension). It may also reduce your risk for type 2 diabetes, heart disease, and stroke.  With the DASH eating plan, you should limit salt (sodium) intake to 2,300 mg a day. If you have hypertension, you may need to reduce your sodium intake to 1,500 mg a day.  When on the DASH eating plan, aim to eat more fresh fruits and vegetables, whole grains, lean proteins, low-fat dairy, and heart-healthy fats.  Work with your health care provider or diet and nutrition specialist (dietitian) to adjust your eating plan to your individual calorie needs. This information is not intended to replace advice given to you by your health care provider. Make sure you discuss any questions you have with your health care provider. Document Released: 05/11/2011 Document Revised: 05/15/2016 Document Reviewed: 05/15/2016 Elsevier Interactive Patient Education  2019 [employer] Inc[employer]  Preventing Unhealthy [employer], Adult Staying at a healthy weight is important to your overall health. When fat builds  up in your body, you may become overweight or obese. Being overweight or obese increases your risk of developing certain health problems, such as heart disease, diabetes, sleeping problems, joint problems, and some types of cancer. Unhealthy weight gain is often the result of making unhealthy food choices or not getting enough exercise. You can make changes to your lifestyle to prevent obesity and stay as healthy as possible. What nutrition changes can be made?   Eat only as much as your body needs. To do this: ? Pay attention to signs that you are hungry or full. Stop eating as soon as you feel full. ? If you feel hungry, try drinking water first before eating. Drink enough water so your urine is clear or pale yellow. ? Eat smaller portions. Pay attention to portion sizes when eating out. ? Look at serving sizes on food labels. Most foods contain more than one serving per container. ? Eat the recommended number of calories for your gender and activity level. For most active people, a daily total of 2,000 calories is appropriate. If you are trying to lose weight or are not very active, you may need to eat fewer calories. Talk with your health care provider or a diet and nutrition specialist (dietitian) about how many calories  you need each day.  Choose healthy foods, such as: ? Fruits and vegetables. At each meal, try to fill at least half of your plate with fruits and vegetables. ? Whole grains, such as whole-wheat bread, brown rice, and quinoa. ? Lean meats, such as chicken or fish. ? Other healthy proteins, such as beans, eggs, or tofu. ? Healthy fats, such as nuts, seeds, fatty fish, and olive oil. ? Low-fat or fat-free dairy products.  Check food labels, and avoid food and drinks that: ? Are high in calories. ? Have added sugar. ? Are high in sodium. ? Have saturated fats or trans fats.  Cook foods in healthier ways, such as by baking, broiling, or grilling.  Make a meal plan for the  week, and shop with a grocery list to help you stay on track with your purchases. Try to avoid going to the grocery store when you are hungry.  When grocery shopping, try to shop around the outside of the store first, where the fresh foods are. Doing this helps you to avoid prepackaged foods, which can be high in sugar, salt (sodium), and fat. What lifestyle changes can be made?   Exercise for 30 or more minutes on 5 or more days each week. Exercising may include brisk walking, yard work, biking, running, swimming, and team sports like basketball and soccer. Ask your health care provider which exercises are safe for you.  Do muscle-strengthening activities, such as lifting weights or using resistance bands, on 2 or more days a week.  Do not use any products that contain nicotine or tobacco, such as cigarettes and e-cigarettes. If you need help quitting, ask your health care provider.  Limit alcohol intake to no more than 1 drink a day for nonpregnant women and 2 drinks a day for men. One drink equals 12 oz of beer, 5 oz of wine, or 1 oz of hard liquor.  Try to get 7-9 hours of sleep each night. What other changes can be made?  Keep a food and activity journal to keep track of: ? What you ate and how many calories you had. Remember to count the calories in sauces, dressings, and side dishes. ? Whether you were active, and what exercises you did. ? Your calorie, weight, and activity goals.  Check your weight regularly. Track any changes. If you notice you have gained weight, make changes to your diet or activity routine.  Avoid taking weight-loss medicines or supplements. Talk to your health care provider before starting any new medicine or supplement.  Talk to your health care provider before trying any new diet or exercise plan. Why are these changes important? Eating healthy, staying active, and having healthy habits can help you to prevent obesity. Those changes also:  Help you  manage stress and emotions.  Help you connect with friends and family.  Improve your self-esteem.  Improve your sleep.  Prevent long-term health problems. What can happen if changes are not made? Being obese or overweight can cause you to develop joint or bone problems, which can make it hard for you to stay active or do activities you enjoy. Being obese or overweight also puts stress on your heart and lungs and can lead to health problems like diabetes, heart disease, and some cancers. Where to find more information Talk with your health care provider or a dietitian about healthy eating and healthy lifestyle choices. You may also find information from:  [place] Department of Agriculture, MyPlate: FormerBoss.no  American Heart Association:  www.heart.org  Centers for Disease Control and Prevention: http://www.wolf.info/ Summary  Staying at a healthy weight is important to your overall health. It helps you to prevent certain diseases and health problems, such as heart disease, diabetes, joint problems, sleep disorders, and some types of cancer.  Being obese or overweight can cause you to develop joint or bone problems, which can make it hard for you to stay active or do activities you enjoy.  You can prevent unhealthy weight gain by eating a healthy diet, exercising regularly, not smoking, limiting alcohol, and getting enough sleep.  Talk with your health care provider or a dietitian for guidance about healthy eating and healthy lifestyle choices. This information is not intended to replace advice given to you by your health care provider. Make sure you discuss any questions you have with your health care provider. Document Released: 05/23/2016 Document Revised: 03/02/2017 Document Reviewed: 06/28/2016 Elsevier Interactive Patient Education  2019 [employer].

## 2018-06-17 NOTE — Assessment & Plan Note (Addendum)
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

## 2018-06-17 NOTE — Progress Notes (Signed)
BP (!) 142/88   Pulse 99   Temp 97.9 F (36.6 C) (Oral)   Ht 5\' 2"  (1.575 m)   Wt 192 lb (87.1 kg)   SpO2 98%   BMI 35.12 kg/m    Subjective:    Patient ID: Patricia Richardson, female    DOB: 11/08/1950, 68 y.o.   MRN: [id number]  HPI: Patricia Richardson is a 68 y.o. female  Chief Complaint  Patient presents with  . New Patient (Initial Visit)    HPI Here to establish care Her BP goes up and down; in the hospital during hip replacement; goes up and down; checks with wrist cuff; never took medicine; father had HTN, was on dialysis; no diabetes; mother was also on dialysis, diabetic Brother has diabetes Glucose readings x 3 were normal; last checked in 2018; no blurred vision or dry mouth She did not have a physical last year She has been taking NSAID for pain in her right hip / lower back Current old, but taking nondecongestant medicines  Hx of left pontine stroke, double vision one morning; 2003; never had a problem since then; never told to take an aspirin; never admitted to the hospital, did not see neurologist; never took aspirin, never took a statin  The 10-year ASCVD risk score Mikey Bussing DC Brooke Bonito., et al., 2013) is: 9.7%*   Values used to calculate the score:     Age: 10 years     Sex: Female     Is Non-Hispanic African American: No     Diabetic: No     Tobacco smoker: No     Systolic Blood Pressure: 517 mmHg     Is BP treated: No     HDL Cholesterol: 61 mg/dL*     Total Cholesterol: 179 mg/dL*     * - Cholesterol units were assumed for this score calculation  Obesity; she would like to weigh 175 pounds; she thinks that would be a good weight; she has been same weight for years  Flu shot, shingles, pneumonia, tetanus UTD  Depression screen [facility] 2/9 06/17/2018  Decreased Interest 0  Down, Depressed, Hopeless 1  PHQ - 2 Score 1  Altered sleeping 0  Tired, decreased energy 0  Change in appetite 1  Feeling bad or failure about yourself  0  Trouble concentrating 0  Moving  slowly or fidgety/restless 0  Suicidal thoughts 0  PHQ-9 Score 2  Difficult doing work/chores Not difficult at all   Fall Risk  06/17/2018  Falls in the past year? 0  Number falls in past yr: 0  Injury with Fall? 0    Relevant past medical, surgical, family and social history reviewed Past Medical History:  Diagnosis Date  . 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 Arthritis   . Cystocele   . Depression   . GERD (gastroesophageal reflux disease)   . History of chicken pox   . Hypertension   . Left pontine CVA ([place]) 10/2001   Past Surgical History:  Procedure Laterality Date  . ABDOMINAL HYSTERECTOMY    . BREAST BIOPSY Right 11/03/2005   core biopsy w/ clip placement  . CHOLECYSTECTOMY    . COLONOSCOPY    . COLONOSCOPY WITH PROPOFOL N/A 11/05/2015   Procedure: COLONOSCOPY WITH PROPOFOL;  Surgeon: Lollie Sails, MD;  Location: [facility] ENDOSCOPY;  Service: Endoscopy;  Laterality: N/A;  . KNEE ARTHROSCOPY Right 02/21/08  . TOTAL HIP ARTHROPLASTY Right 04/09/2018   Procedure: TOTAL HIP ARTHROPLASTY ANTERIOR APPROACH;  Surgeon: Rudene Christians,  Legrand Como, MD;  Location: [facility] ORS;  Service: Orthopedics;  Laterality: Right;  . WISDOM TOOTH EXTRACTION     Family History  Problem Relation Age of Onset  . Breast cancer Maternal Aunt        great  . Kidney disease Mother   . Diabetes Mother   . Hypertension Father   . Kidney disease Father   . Diabetes Brother   . Diabetes Paternal Grandmother    Social History   Tobacco Use  . Smoking status: Never Smoker  . Smokeless tobacco: Never Used  Substance Use Topics  . Alcohol use: Yes    Comment: rarely  . Drug use: No     [facility] Visit from 06/17/2018 in [facility]  AUDIT-C Score  0      Interim medical history since last visit reviewed. Allergies and medications reviewed  Review of Systems Per HPI unless specifically indicated above     Objective:    BP (!) 142/88   Pulse 99   Temp 97.9 F (36.6 C) (Oral)   Ht 5\' 2"   (1.575 m)   Wt 192 lb (87.1 kg)   SpO2 98%   BMI 35.12 kg/m   Wt Readings from Last 3 Encounters:  06/17/18 192 lb (87.1 kg)  03/29/18 195 lb 1.6 oz (88.5 kg)  11/05/15 186 lb (84.4 kg)    Physical Exam Constitutional:      General: She is not in acute distress.    Appearance: She is well-developed. She is not diaphoretic.  HENT:     Head: Normocephalic and atraumatic.  Eyes:     General: No scleral icterus. Neck:     Thyroid: No thyromegaly.  Cardiovascular:     Rate and Rhythm: Normal rate and regular rhythm.     Heart sounds: Normal heart sounds. No murmur.  Pulmonary:     Effort: Pulmonary effort is normal. No respiratory distress.     Breath sounds: Normal breath sounds. No wheezing.  Abdominal:     General: Bowel sounds are normal. There is no distension.     Palpations: Abdomen is soft.  Skin:    General: Skin is warm and dry.     Coloration: Skin is not pale.     Comments: Nailbeds pale relative to examiner, otherwise, skin does not appear pale; no palmar erythema  Neurological:     Mental Status: She is alert.  Psychiatric:        Behavior: Behavior normal.        Thought Content: Thought content normal.        Judgment: Judgment normal.     Results for orders placed or performed during the hospital encounter of 04/09/18  APTT  Result Value Ref Range   aPTT 26 24 - 36 seconds  Protime-INR  Result Value Ref Range   Prothrombin Time 12.4 11.4 - 15.2 seconds   INR 0.93   CBC  Result Value Ref Range   WBC 16.0 (H) 4.0 - 10.5 K/uL   RBC 4.21 3.87 - 5.11 MIL/uL   Hemoglobin 12.7 12.0 - 15.0 g/dL   HCT 38.1 36.0 - 46.0 %   MCV 90.5 80.0 - 100.0 fL   MCH 30.2 26.0 - 34.0 pg   MCHC 33.3 30.0 - 36.0 g/dL   RDW 13.2 11.5 - 15.5 %   Platelets 310 150 - 400 K/uL   nRBC 0.0 0.0 - 0.2 %  Basic metabolic panel  Result Value Ref Range   Sodium  140 135 - 145 mmol/L   Potassium 4.2 3.5 - 5.1 mmol/L   Chloride 108 98 - 111 mmol/L   CO2 24 22 - 32 mmol/L    Glucose, Bld 144 (H) 70 - 99 mg/dL   BUN 12 8 - 23 mg/dL   Creatinine, Ser 0.60 0.44 - 1.00 mg/dL   Calcium 8.8 (L) 8.9 - 10.3 mg/dL   GFR calc non Af Amer >60 >60 mL/min   GFR calc Af Amer >60 >60 mL/min   Anion gap 8 5 - 15  Glucose, capillary  Result Value Ref Range   Glucose-Capillary 138 (H) 70 - 99 mg/dL  CBC  Result Value Ref Range   WBC 11.2 (H) 4.0 - 10.5 K/uL   RBC 4.04 3.87 - 5.11 MIL/uL   Hemoglobin 11.9 (L) 12.0 - 15.0 g/dL   HCT 36.5 36.0 - 46.0 %   MCV 90.3 80.0 - 100.0 fL   MCH 29.5 26.0 - 34.0 pg   MCHC 32.6 30.0 - 36.0 g/dL   RDW 13.4 11.5 - 15.5 %   Platelets 303 150 - 400 K/uL   nRBC 0.0 0.0 - 0.2 %  Basic metabolic panel  Result Value Ref Range   Sodium 139 135 - 145 mmol/L   Potassium 4.0 3.5 - 5.1 mmol/L   Chloride 105 98 - 111 mmol/L   CO2 24 22 - 32 mmol/L   Glucose, Bld 139 (H) 70 - 99 mg/dL   BUN 14 8 - 23 mg/dL   Creatinine, Ser 0.69 0.44 - 1.00 mg/dL   Calcium 8.6 (L) 8.9 - 10.3 mg/dL   GFR calc non Af Amer >60 >60 mL/min   GFR calc Af Amer >60 >60 mL/min   Anion gap 10 5 - 15  ABO/Rh  Result Value Ref Range   ABO/RH(D)      A POS Performed at [facility], [address], [place], [place] [address]   Surgical pathology  Result Value Ref Range   SURGICAL PATHOLOGY      Surgical Pathology CASE: [id number] PATIENT: Arville Go Hiers Surgical Pathology Report     SPECIMEN SUBMITTED: A. Femoral head, right  CLINICAL HISTORY: None provided  PRE-OPERATIVE DIAGNOSIS: Primary osteoarthritis right hip  POST-OPERATIVE DIAGNOSIS: Same as pre-op     DIAGNOSIS: A. FEMORAL HEAD, RIGHT: - OSTEOARTHROSIS. - TRILINEAGE HEMATOPOIESIS.   GROSS DESCRIPTION: A. Labeled: Right femoral head Received: In formalin Size of specimen:      Head -3.9 x 3.8 cm      Neck -3.1 x 2.4 cm Articular surface: Tan to brown with granularity and focal disruption Cut surface: Yellow to brown Other findings: None noted  Block summary: 1 -  representative section  Tissue decalcification: Yes  Final Diagnosis performed by Quay Burow, MD.   Electronically signed 04/12/2018 7:41:31AM The electronic signature indicates that the named Attending Pathologist has evaluated the specimen  Technical component performed at [facility], [address], [place],  [place] [address] Lab: [phone] Dir: Rush Farmer, MD, MMM  Professional component performed at [facility], [facility], [place], [place], [place] [address] Lab: [phone] Dir: Dellia Nims. Rubinas, MD       Assessment & Plan:   Problem List Items Addressed This Visit      Cardiovascular and Mediastinum   Essential hypertension, benign - Primary (Chronic)    Try to DASH guidelines; suggested that we start a CCB to help avoid the high readings; she wanted to try lifestyle habits instead of medicine  first, then will consider medicine if needed; risk of stroke discussed; she will try to increase activity somewhat, start slowly and gradually; she will check BP and notify me with readings in 1-2 weeks      Relevant Orders   Lipid panel     Other   Obesity (BMI 35.0-39.9 without comorbidity) (Chronic)    Encouraged weight loss today; goal BMI right now less than 30 to get out of obesity category; patient politely declined nutritionist referral      Hx of completed stroke (Chronic)    Right now, ASCVD 9.7%; patient does not want a statin; she is willing to take a baby 81 mg coated aspirin daily; will get carotid US      Relevant Orders   US Carotid Bilateral   Lipid panel    Other Visit Diagnoses    Pallor of nail bed       Need for Tdap vaccination       Estrogen deficiency       Relevant Orders   DG Bone Density   Hyperglycemia       Relevant Orders   Hemoglobin A1c   Anemia, unspecified type       Relevant Orders   CBC with Differential/Platelet   Urine white blood cells increased       Relevant Orders   Urinalysis w microscopic +  reflex cultur   Medication monitoring encounter       Relevant Orders   COMPLETE METABOLIC PANEL WITH GFR       Follow up plan: Return in about 3 months (around 09/16/2018) for follow-up visit with Dr. Sanda Klein.  An after-visit summary was printed and given to the patient at [facility].  Please see the patient instructions which may contain other information and recommendations beyond what is mentioned above in the assessment and plan.  No orders of the defined types were placed in this encounter.   Orders Placed This Encounter  Procedures  . DG Bone Density  . US Carotid Bilateral  . CBC with Differential/Platelet  . COMPLETE METABOLIC PANEL WITH GFR  . Hemoglobin A1c  . Lipid panel  . Urinalysis w microscopic + reflex cultur

## 2018-06-17 NOTE — Assessment & Plan Note (Addendum)
Try to DASH guidelines; suggested that we start a CCB to help avoid the high readings; she wanted to try lifestyle habits instead of medicine first, then will consider medicine if needed; risk of stroke discussed; she will try to increase activity somewhat, start slowly and gradually; she will check BP and notify me with readings in 1-2 weeks

## 2018-06-18 LAB — COMPLETE METABOLIC PANEL WITH GFR
AG Ratio: 1.5 (calc) (ref 1.0–2.5)
ALKALINE PHOSPHATASE (APISO): 89 U/L (ref 33–130)
ALT: 18 U/L (ref 6–29)
AST: 17 U/L (ref 10–35)
Albumin: 4.3 g/dL (ref 3.6–5.1)
BILIRUBIN TOTAL: 0.5 mg/dL (ref 0.2–1.2)
BUN: 14 mg/dL (ref 7–25)
CO2: 25 mmol/L (ref 20–32)
CREATININE: 0.66 mg/dL (ref 0.50–0.99)
Calcium: 9.7 mg/dL (ref 8.6–10.4)
Chloride: 106 mmol/L (ref 98–110)
GFR, Est African American: 104 mL/min/{1.73_m2} (ref >=60)
GFR, Est Non African American: 90 mL/min/{1.73_m2} (ref >=60)
GLUCOSE: 82 mg/dL (ref 65–99)
Globulin: 2.9 g/dL (calc) (ref 1.9–3.7)
Potassium: 4.9 mmol/L (ref 3.5–5.3)
SODIUM: 143 mmol/L (ref 135–146)
Total Protein: 7.2 g/dL (ref 6.1–8.1)

## 2018-06-18 LAB — HEMOGLOBIN A1C
Hgb A1c MFr Bld: 5.8 % of total Hgb — ABNORMAL HIGH (ref <5.7)
Mean Plasma Glucose: 120 (calc)
eAG (mmol/L): 6.6 (calc)

## 2018-06-18 LAB — CBC WITH DIFFERENTIAL/PLATELET
Absolute Monocytes: 519 cells/uL (ref 200–950)
Basophils Absolute: 36 cells/uL (ref 0–200)
Basophils Relative: 0.4 %
Eosinophils Absolute: 109 cells/uL (ref 15–500)
Eosinophils Relative: 1.2 %
HCT: 42.4 % (ref 35.0–45.0)
Hemoglobin: 14.2 g/dL (ref 11.7–15.5)
Lymphs Abs: 1556 cells/uL (ref 850–3900)
MCH: 29.4 pg (ref 27.0–33.0)
MCHC: 33.5 g/dL (ref 32.0–36.0)
MCV: 87.8 fL (ref 80.0–100.0)
MPV: 10.3 fL (ref 7.5–12.5)
Monocytes Relative: 5.7 %
Neutro Abs: 6880 cells/uL (ref 1500–7800)
Neutrophils Relative %: 75.6 %
Platelets: 397 10*3/uL (ref 140–400)
RBC: 4.83 10*6/uL (ref 3.80–5.10)
RDW: 12.9 % (ref 11.0–15.0)
Total Lymphocyte: 17.1 %
WBC: 9.1 10*3/uL (ref 3.8–10.8)

## 2018-06-18 LAB — URINALYSIS W MICROSCOPIC + REFLEX CULTURE
Bacteria, UA: NONE SEEN /HPF
Bilirubin Urine: NEGATIVE
Glucose, UA: NEGATIVE
HYALINE CAST: NONE SEEN /LPF
Hgb urine dipstick: NEGATIVE
Ketones, ur: NEGATIVE
Leukocyte Esterase: NEGATIVE
Nitrites, Initial: NEGATIVE
Protein, ur: NEGATIVE
Specific Gravity, Urine: 1.02 (ref 1.001–1.03)
Squamous Epithelial / HPF: NONE SEEN /HPF (ref <=5)
pH: 6.5 (ref 5.0–8.0)

## 2018-06-18 LAB — LIPID PANEL
Cholesterol: 172 mg/dL (ref <200)
HDL: 61 mg/dL (ref >50)
LDL Cholesterol (Calc): 91 mg/dL (calc)
NON-HDL CHOLESTEROL (CALC): 111 mg/dL (ref <130)
TRIGLYCERIDES: 102 mg/dL (ref <150)
Total CHOL/HDL Ratio: 2.8 (calc) (ref <5.0)

## 2018-06-18 LAB — NO CULTURE INDICATED

## 2018-06-20 ENCOUNTER — Other Ambulatory Visit: Payer: Self-pay | Admitting: Family Medicine

## 2018-06-20 ENCOUNTER — Encounter: Payer: Self-pay | Admitting: Family Medicine

## 2018-06-20 DIAGNOSIS — Z9189 Other specified personal risk factors, not elsewhere classified: Secondary | ICD-10-CM | POA: Insufficient documentation

## 2018-06-20 DIAGNOSIS — R7303 Prediabetes: Secondary | ICD-10-CM | POA: Insufficient documentation

## 2018-06-20 MED ORDER — ASPIRIN EC 81 MG PO TBEC: 81.0000 mg | DELAYED_RELEASE_TABLET | Freq: Every day | ORAL | Status: DC

## 2018-06-21 ENCOUNTER — Other Ambulatory Visit: Payer: Self-pay | Admitting: Family Medicine

## 2018-06-21 ENCOUNTER — Telehealth: Payer: Self-pay

## 2018-06-21 DIAGNOSIS — Z9189 Other specified personal risk factors, not elsewhere classified: Secondary | ICD-10-CM

## 2018-06-21 DIAGNOSIS — Z8673 Personal history of transient ischemic attack (TIA), and cerebral infarction without residual deficits: Secondary | ICD-10-CM

## 2018-06-21 DIAGNOSIS — I10 Essential (primary) hypertension: Secondary | ICD-10-CM

## 2018-06-21 DIAGNOSIS — R739 Hyperglycemia, unspecified: Secondary | ICD-10-CM

## 2018-06-21 DIAGNOSIS — R7303 Prediabetes: Secondary | ICD-10-CM

## 2018-06-21 MED ORDER — ATORVASTATIN CALCIUM 10 MG PO TABS: 10.0000 mg | ORAL_TABLET | Freq: Every day | ORAL | 1 refills | Status: DC

## 2018-06-21 NOTE — Telephone Encounter (Signed)
-----   Message from Arnetha Courser, MD sent at 06/20/2018  4:32 PM EST ----- ##Patricia Richardson, please let the patient know all of the other information, plus we'll recommend 81 mg aspirin daily too##

## 2018-06-21 NOTE — Telephone Encounter (Signed)
Lipid ordered

## 2018-07-13 ENCOUNTER — Other Ambulatory Visit: Payer: Self-pay | Admitting: Family Medicine

## 2018-07-14 NOTE — Telephone Encounter (Signed)
Two month supply prescribed on 06/21/2018 of the atorvastatin Not due for refill yet Note to pharmacy that she'll be due for labs in March and to use the Rx from 06/21/18

## 2018-07-23 DIAGNOSIS — H40153 Residual stage of open-angle glaucoma, bilateral: Secondary | ICD-10-CM | POA: Diagnosis not present

## 2018-08-09 ENCOUNTER — Other Ambulatory Visit: Payer: Self-pay

## 2018-08-09 DIAGNOSIS — I10 Essential (primary) hypertension: Secondary | ICD-10-CM

## 2018-08-09 DIAGNOSIS — Z8673 Personal history of transient ischemic attack (TIA), and cerebral infarction without residual deficits: Secondary | ICD-10-CM | POA: Diagnosis not present

## 2018-08-09 DIAGNOSIS — Z9189 Other specified personal risk factors, not elsewhere classified: Secondary | ICD-10-CM | POA: Diagnosis not present

## 2018-08-09 DIAGNOSIS — R7303 Prediabetes: Secondary | ICD-10-CM

## 2018-08-10 LAB — LIPID PANEL
Cholesterol: 168 mg/dL (ref <200)
HDL: 55 mg/dL (ref >=50)
LDL Cholesterol (Calc): 91 mg/dL (calc)
Non-HDL Cholesterol (Calc): 113 mg/dL (calc) (ref <130)
TRIGLYCERIDES: 123 mg/dL (ref <150)
Total CHOL/HDL Ratio: 3.1 (calc) (ref <5.0)

## 2018-08-12 DIAGNOSIS — Z96641 Presence of right artificial hip joint: Secondary | ICD-10-CM | POA: Diagnosis not present

## 2018-08-12 DIAGNOSIS — M25551 Pain in right hip: Secondary | ICD-10-CM | POA: Diagnosis not present

## 2018-08-14 ENCOUNTER — Encounter: Payer: Self-pay | Admitting: Family Medicine

## 2018-08-20 ENCOUNTER — Other Ambulatory Visit: Payer: Self-pay | Admitting: Family Medicine

## 2018-09-12 ENCOUNTER — Other Ambulatory Visit: Payer: Self-pay | Admitting: Family Medicine

## 2018-09-23 ENCOUNTER — Telehealth: Payer: Self-pay | Admitting: Family Medicine

## 2018-09-23 ENCOUNTER — Encounter: Payer: Self-pay | Admitting: Family Medicine

## 2018-09-23 ENCOUNTER — Other Ambulatory Visit: Payer: Self-pay

## 2018-09-23 ENCOUNTER — Ambulatory Visit (INDEPENDENT_AMBULATORY_CARE_PROVIDER_SITE_OTHER): Payer: Medicare Other | Admitting: Family Medicine

## 2018-09-23 VITALS — BP 135/81 | Ht 62.0 in | Wt 187.0 lb

## 2018-09-23 DIAGNOSIS — Z1159 Encounter for screening for other viral diseases: Secondary | ICD-10-CM

## 2018-09-23 DIAGNOSIS — Z5181 Encounter for therapeutic drug level monitoring: Secondary | ICD-10-CM

## 2018-09-23 DIAGNOSIS — Z8673 Personal history of transient ischemic attack (TIA), and cerebral infarction without residual deficits: Secondary | ICD-10-CM

## 2018-09-23 DIAGNOSIS — R7303 Prediabetes: Secondary | ICD-10-CM

## 2018-09-23 DIAGNOSIS — Z86010 Personal history of colonic polyps: Secondary | ICD-10-CM | POA: Diagnosis not present

## 2018-09-23 DIAGNOSIS — E669 Obesity, unspecified: Secondary | ICD-10-CM | POA: Diagnosis not present

## 2018-09-23 DIAGNOSIS — K219 Gastro-esophageal reflux disease without esophagitis: Secondary | ICD-10-CM | POA: Diagnosis not present

## 2018-09-23 DIAGNOSIS — I10 Essential (primary) hypertension: Secondary | ICD-10-CM

## 2018-09-23 DIAGNOSIS — Z79899 Other long term (current) drug therapy: Secondary | ICD-10-CM | POA: Insufficient documentation

## 2018-09-23 DIAGNOSIS — R1310 Dysphagia, unspecified: Secondary | ICD-10-CM | POA: Diagnosis not present

## 2018-09-23 MED ORDER — OMEPRAZOLE 20 MG PO CPDR: 20.0000 mg | DELAYED_RELEASE_CAPSULE | ORAL | 0 refills | Status: DC

## 2018-09-23 MED ORDER — ATORVASTATIN CALCIUM 10 MG PO TABS: ORAL_TABLET | ORAL | 0 refills | Status: DC

## 2018-09-23 NOTE — Telephone Encounter (Signed)
Rx sent 

## 2018-09-23 NOTE — Assessment & Plan Note (Signed)
Encouraged weight loss, 5-15 pounds can really make a difference

## 2018-09-23 NOTE — Progress Notes (Signed)
BP 135/81 (BP Location: Right Arm, Patient Position: Sitting, Cuff Size: Normal)   Ht 5\' 2"  (1.575 m)   Wt 187 lb (84.8 kg)   BMI 34.20 kg/m    Subjective:    Patient ID: Patricia Richardson, female    DOB: May 30, 1951, 68 y.o.   MRN: [id number]  HPI: Patricia Richardson is a 68 y.o. female  Chief Complaint  Patient presents with  . Follow-up    HPI Virtual Visit via Telephone/Video Note   I connected with the patient via:  Doximity video I verified that I am speaking with the correct person using two identifiers.  Call started: 9:34 am Call terminated: 10:05 am Total length of call: 31 minutes   I discussed the limitations, risks, and privacy concerns of performing an evaluation and management service by telephone and the availability of in-person appointments. I explained that he/she may be responsible for charges related to this service. The patient expressed understanding and agreed to proceed.  Provider location: home, upstairs office with door closed, earphones/headset on Patient location: home Additional participants: no one  Last visit was Jun 17, 2018 Since last visit, she has seen Dr. Hessie Knows, ortho; another appt May 18th; this is not something that has to be dealt with right now She was supposed to go to [facility] April 11th but that got canceled; she was supposed to get a shot in her knee; it's been bothering her She has not been taking aleve; not much at all; just using [facility] and rubbing it down and that helps with the pain  She is taking the omeprazole for reflux; she would like a 90 day supply She does not have Barrett's esophagus; Dr. Rudene Christians put her on that before the hip replacement; she does not have have any reflux any way; no blood in the stool or abdominal pain  Not taking the cholesterol medicine; she took it for a few days and she was talking to some friends; she read about it on the internet; she read too much about her kidneys; both parents were on dialysis;  she says her cholesterol "isn't that bad"; no side effects from the medicine; she stopped it after 3 days  HTN; last readings reviewed; she has been checking her BP: 138/87, 126/73, 131/79, 119/73, 119/74, 113/68, ... checking every day, today 135/81; she does limit extra salt; very little salt  BP Readings from Last 3 Encounters:  09/23/18 135/81  06/17/18 (!) 142/88  04/11/18 137/66   Prediabetes; mother had diabetes; brother is a diabetic; most aunts have diabetes; no side effects of dry mouth or blurred vision; she has an eye appt in June  Lab Results  Component Value Date   HGBA1C 5.8 (H) 06/17/2018   Hx of completed stroke; no trouble at all; very active and push mows; no chest pains, just gives out after walking and walking walking; gets hot easily; doing okay now; not new for her; one a day right now is okay; Dr. Rudene Christians did BID after surgery  Depression screen [facility] 2/9 06/17/2018  Decreased Interest 0  Down, Depressed, Hopeless 1  PHQ - 2 Score 1  Altered sleeping 0  Tired, decreased energy 0  Change in appetite 1  Feeling bad or failure about yourself  0  Trouble concentrating 0  Moving slowly or fidgety/restless 0  Suicidal thoughts 0  PHQ-9 Score 2  Difficult doing work/chores Not difficult at all   Fall Risk  09/23/2018 06/17/2018  Falls in the  past year? 0 0  Number falls in past yr: - 0  Injury with Fall? - 0    Relevant past medical, surgical, family and social history reviewed Past Medical History:  Diagnosis Date  . 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 Arthritis   . Cystocele   . Depression   . GERD (gastroesophageal reflux disease)   . History of chicken pox   . Hypertension   . Left pontine CVA ([place]) 10/2001   Past Surgical History:  Procedure Laterality Date  . ABDOMINAL HYSTERECTOMY    . BREAST BIOPSY Right 11/03/2005   core biopsy w/ clip placement  . CHOLECYSTECTOMY    . COLONOSCOPY    . COLONOSCOPY WITH PROPOFOL N/A 11/05/2015   Procedure: COLONOSCOPY  WITH PROPOFOL;  Surgeon: Lollie Sails, MD;  Location: [facility] ENDOSCOPY;  Service: Endoscopy;  Laterality: N/A;  . KNEE ARTHROSCOPY Right 02/21/08  . TOTAL HIP ARTHROPLASTY Right 04/09/2018   Procedure: TOTAL HIP ARTHROPLASTY ANTERIOR APPROACH;  Surgeon: Hessie Knows, MD;  Location: [facility] ORS;  Service: Orthopedics;  Laterality: Right;  . WISDOM TOOTH EXTRACTION     Family History  Problem Relation Age of Onset  . Breast cancer Maternal Aunt        great  . Kidney disease Mother   . Diabetes Mother   . Hypertension Father   . Kidney disease Father   . Diabetes Brother   . Diabetes Paternal Grandmother    Social History   Tobacco Use  . Smoking status: Never Smoker  . Smokeless tobacco: Never Used  Substance Use Topics  . Alcohol use: Yes    Comment: rarely  . Drug use: No     [facility] Visit from 09/23/2018 in [facility]  AUDIT-C Score  0      Interim medical history since last visit reviewed. Allergies and medications reviewed  Review of Systems Per HPI unless specifically indicated above     Objective:    BP 135/81 (BP Location: Right Arm, Patient Position: Sitting, Cuff Size: Normal)   Ht 5\' 2"  (1.575 m)   Wt 187 lb (84.8 kg)   BMI 34.20 kg/m   Wt Readings from Last 3 Encounters:  09/23/18 187 lb (84.8 kg)  06/17/18 192 lb (87.1 kg)  03/29/18 195 lb 1.6 oz (88.5 kg)    Physical Exam Constitutional:      Appearance: Normal appearance.  Eyes:     Extraocular Movements: Extraocular movements intact.     Right eye: Normal extraocular motion.     Left eye: Normal extraocular motion.  Pulmonary:     Effort: No tachypnea or respiratory distress.  Skin:    Coloration: Skin is not jaundiced or pale.  Neurological:     Mental Status: She is alert.     Cranial Nerves: No dysarthria.  Psychiatric:        Mood and Affect: Mood is not anxious or depressed.        Speech: Speech is not rapid and pressured, delayed or slurred.     Results for  orders placed or performed in visit on 08/09/18  Lipid panel  Result Value Ref Range   Cholesterol 168 <200 mg/dL   HDL 55 > OR = 50 mg/dL   Triglycerides 123 <150 mg/dL   LDL Cholesterol (Calc) 91 mg/dL (calc)   Total CHOL/HDL Ratio 3.1 <5.0 (calc)   Non-HDL Cholesterol (Calc) 113 <130 mg/dL (calc)      Assessment & Plan:   Problem List Items  Addressed This Visit      Cardiovascular and Mediastinum   Essential hypertension, benign - Primary (Chronic)    Pressures are controlled; she is monitoring her BP; trying to limit salt; I advised that salt used in cooking still finds its way to the table        Other   Prediabetes   Relevant Orders   Hemoglobin A1c   Obesity (BMI 35.0-39.9 without comorbidity) (Chronic)    Encouraged weight loss, 5-15 pounds can really make a difference      Hx of completed stroke (Chronic)    She is worried about the statin; she is wiling to take just twice a week; check lipids in 6-8 weeks; limit saturated fats      Relevant Orders   US Carotid Bilateral   Lipid panel   Current use of proton pump inhibitor    I explained risk of prolonged PPI use; will have her decrease to every other day; notify me if symptoms develop; she denies hx of Barrett's       Other Visit Diagnoses    Medication monitoring encounter       Relevant Orders   COMPLETE METABOLIC PANEL WITH GFR   Encounter for hepatitis C screening test for low risk patient       Relevant Orders   Hepatitis C antibody       Follow up plan: Return in about 6 months (around 03/25/2019) for follow-up visit with Dr. Sanda Klein.   Meds ordered this encounter  Medications  . omeprazole (PRILOSEC) 20 MG capsule    Sig: Take 1 capsule (20 mg total) by mouth every other day.    Dispense:  45 capsule    Refill:  0    Orders Placed This Encounter  Procedures  . US Carotid Bilateral  . Lipid panel  . COMPLETE METABOLIC PANEL WITH GFR  . Hemoglobin A1c  . Hepatitis C antibody

## 2018-09-23 NOTE — Telephone Encounter (Signed)
Please schedule patient for an appointment With whom: Dr. Sanda Klein When: 6 months How: In person Why: high blood pressure, obesity Thank you

## 2018-09-23 NOTE — Telephone Encounter (Signed)
Pt said that she needs the medication for Cholestorol called back in. She said that you al talked about her taking this 2 times a week. Pharm is [facility] on [address]

## 2018-09-23 NOTE — Assessment & Plan Note (Signed)
She is worried about the statin; she is wiling to take just twice a week; check lipids in 6-8 weeks; limit saturated fats

## 2018-09-23 NOTE — Assessment & Plan Note (Signed)
I explained risk of prolonged PPI use; will have her decrease to every other day; notify me if symptoms develop; she denies hx of Barrett's

## 2018-09-23 NOTE — Assessment & Plan Note (Signed)
Pressures are controlled; she is monitoring her BP; trying to limit salt; I advised that salt used in cooking still finds its way to the table

## 2018-10-04 ENCOUNTER — Other Ambulatory Visit: Payer: Self-pay | Admitting: Gastroenterology

## 2018-10-04 DIAGNOSIS — R1310 Dysphagia, unspecified: Secondary | ICD-10-CM

## 2018-10-21 DIAGNOSIS — Z96641 Presence of right artificial hip joint: Secondary | ICD-10-CM | POA: Diagnosis not present

## 2018-10-21 DIAGNOSIS — M1611 Unilateral primary osteoarthritis, right hip: Secondary | ICD-10-CM | POA: Diagnosis not present

## 2018-10-21 DIAGNOSIS — M1711 Unilateral primary osteoarthritis, right knee: Secondary | ICD-10-CM | POA: Diagnosis not present

## 2018-10-23 ENCOUNTER — Ambulatory Visit
Admission: RE | Admit: 2018-10-23 | Discharge: 2018-10-23 | Disposition: A | Discharge status: Home / Self Care | Payer: Medicare Other | Source: Ambulatory Visit | Attending: Family Medicine | Admitting: Family Medicine

## 2018-10-23 ENCOUNTER — Other Ambulatory Visit: Payer: Self-pay

## 2018-10-23 DIAGNOSIS — Z8673 Personal history of transient ischemic attack (TIA), and cerebral infarction without residual deficits: Secondary | ICD-10-CM | POA: Diagnosis not present

## 2018-10-23 DIAGNOSIS — I6523 Occlusion and stenosis of bilateral carotid arteries: Secondary | ICD-10-CM | POA: Diagnosis not present

## 2018-10-24 ENCOUNTER — Other Ambulatory Visit: Payer: Self-pay | Admitting: Family Medicine

## 2018-10-24 DIAGNOSIS — E042 Nontoxic multinodular goiter: Secondary | ICD-10-CM

## 2018-11-04 ENCOUNTER — Other Ambulatory Visit: Payer: Self-pay | Admitting: Family Medicine

## 2018-11-04 ENCOUNTER — Other Ambulatory Visit: Payer: Self-pay

## 2018-11-04 ENCOUNTER — Ambulatory Visit
Admission: RE | Admit: 2018-11-04 | Discharge: 2018-11-04 | Disposition: A | Discharge status: Home / Self Care | Payer: Medicare Other | Source: Ambulatory Visit | Attending: Gastroenterology | Admitting: Gastroenterology

## 2018-11-04 DIAGNOSIS — Z5181 Encounter for therapeutic drug level monitoring: Secondary | ICD-10-CM

## 2018-11-04 DIAGNOSIS — R7303 Prediabetes: Secondary | ICD-10-CM

## 2018-11-04 DIAGNOSIS — Z8673 Personal history of transient ischemic attack (TIA), and cerebral infarction without residual deficits: Secondary | ICD-10-CM | POA: Diagnosis not present

## 2018-11-04 DIAGNOSIS — R1310 Dysphagia, unspecified: Secondary | ICD-10-CM | POA: Diagnosis not present

## 2018-11-04 DIAGNOSIS — Z1159 Encounter for screening for other viral diseases: Secondary | ICD-10-CM

## 2018-11-04 DIAGNOSIS — K219 Gastro-esophageal reflux disease without esophagitis: Secondary | ICD-10-CM | POA: Diagnosis not present

## 2018-11-04 DIAGNOSIS — E042 Nontoxic multinodular goiter: Secondary | ICD-10-CM

## 2018-11-05 LAB — LIPID PANEL
Cholesterol: 150 mg/dL (ref <200)
HDL: 57 mg/dL (ref >=50)
LDL Cholesterol (Calc): 72 mg/dL (calc)
Non-HDL Cholesterol (Calc): 93 mg/dL (calc) (ref <130)
Total CHOL/HDL Ratio: 2.6 (calc) (ref <5.0)
Triglycerides: 125 mg/dL (ref <150)

## 2018-11-05 LAB — THYROID PANEL WITH TSH
Free Thyroxine Index: 1.8 (ref 1.4–3.8)
T3 Uptake: 31 % (ref 22–35)
T4, Total: 5.7 ug/dL (ref 5.1–11.9)
TSH: 0.81 mIU/L (ref 0.40–4.50)

## 2018-11-05 LAB — COMPLETE METABOLIC PANEL WITH GFR
AG Ratio: 1.6 (calc) (ref 1.0–2.5)
ALT: 13 U/L (ref 6–29)
AST: 12 U/L (ref 10–35)
Albumin: 4 g/dL (ref 3.6–5.1)
Alkaline phosphatase (APISO): 69 U/L (ref 37–153)
BUN: 15 mg/dL (ref 7–25)
CO2: 23 mmol/L (ref 20–32)
Calcium: 9.5 mg/dL (ref 8.6–10.4)
Chloride: 108 mmol/L (ref 98–110)
Creat: 0.74 mg/dL (ref 0.60–0.93)
GFR, Est African American: 95 mL/min/{1.73_m2} (ref >=60)
GFR, Est Non African American: 82 mL/min/{1.73_m2} (ref >=60)
Globulin: 2.5 g/dL (calc) (ref 1.9–3.7)
Glucose, Bld: 101 mg/dL — ABNORMAL HIGH (ref 65–99)
Potassium: 4.3 mmol/L (ref 3.5–5.3)
Sodium: 141 mmol/L (ref 135–146)
Total Bilirubin: 0.5 mg/dL (ref 0.2–1.2)
Total Protein: 6.5 g/dL (ref 6.1–8.1)

## 2018-11-05 LAB — HEPATITIS C ANTIBODY
Hepatitis C Ab: NONREACTIVE
SIGNAL TO CUT-OFF: 0.03 (ref <1.00)

## 2018-11-05 LAB — HEMOGLOBIN A1C
Hgb A1c MFr Bld: 5.5 % of total Hgb (ref <5.7)
Mean Plasma Glucose: 111 (calc)
eAG (mmol/L): 6.2 (calc)

## 2018-11-12 ENCOUNTER — Ambulatory Visit
Admission: RE | Admit: 2018-11-12 | Discharge: 2018-11-12 | Disposition: A | Discharge status: Home / Self Care | Payer: Medicare Other | Source: Ambulatory Visit | Attending: Family Medicine | Admitting: Family Medicine

## 2018-11-12 ENCOUNTER — Other Ambulatory Visit: Payer: Self-pay

## 2018-11-12 DIAGNOSIS — E042 Nontoxic multinodular goiter: Secondary | ICD-10-CM | POA: Diagnosis not present

## 2018-11-13 ENCOUNTER — Other Ambulatory Visit: Payer: Self-pay | Admitting: Family Medicine

## 2018-11-13 DIAGNOSIS — E042 Nontoxic multinodular goiter: Secondary | ICD-10-CM

## 2018-11-20 ENCOUNTER — Other Ambulatory Visit: Payer: Self-pay

## 2018-11-20 ENCOUNTER — Encounter: Payer: Self-pay | Admitting: General Surgery

## 2018-11-20 ENCOUNTER — Ambulatory Visit: Payer: Self-pay

## 2018-11-20 ENCOUNTER — Ambulatory Visit: Payer: Medicare Other | Admitting: General Surgery

## 2018-11-20 VITALS — BP 158/84 | HR 73 | Temp 97.5°F | Resp 14 | Ht 62.0 in | Wt 192.8 lb

## 2018-11-20 DIAGNOSIS — E041 Nontoxic single thyroid nodule: Secondary | ICD-10-CM

## 2018-11-20 DIAGNOSIS — E042 Nontoxic multinodular goiter: Secondary | ICD-10-CM | POA: Diagnosis not present

## 2018-11-20 NOTE — Patient Instructions (Signed)
You may use Ibuprofen or Tylenol for any discomfort. Use the ice pack off and on today to help with comfort and to prevent any bleeding. Place it in your freezer to get cold. Be sure to have something between you and the ice pack.   We will call you with your results.

## 2018-11-20 NOTE — Progress Notes (Signed)
Patient ID: Patricia Richardson, female   DOB: 04-30-1951, 68 y.o.   MRN: [id number]  Chief Complaint  Patient presents with  . New Patient (Initial Visit)    Thyroid nodules    HPI Patricia Richardson is a 68 y.o. female.   She has been referred by [name], FNP, for further evaluation of thyroid nodules.  [name] states that she underwent carotid Doppler ultrasound recently and was found to have multiple thyroid nodules.  A subsequent dedicated thyroid ultrasound identified a number of small, non-concerning nodules.  Additionally, there were 2 right-sided nodules that met Ti RADS arteria for further follow-up.  The more superior one met criteria for biopsy, the more inferior nodule meets criteria for interval surveillance.  [name] reports that had the nodules not been found incidentally, she would not be aware of their presence.  She denies any heart palpitations or hand tremors.  There have been no changes to the texture of her hair, skin, or fingernails.  She does endorse some cold intolerance.  She denies significant weight fluctuations.  She also has occasional constipation for which she takes a laxative.  She has no history of head or neck irradiation or occupational exposure to ionizing radiation.  Her thyroid function tests were within normal limits.  She has no known family history of thyroid disease.   Past Medical History:  Diagnosis Date  . 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 Arthritis   . Cystocele   . Depression   . GERD (gastroesophageal reflux disease)   . History of chicken pox   . Hypertension   . 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 ABDOMINAL HYSTERECTOMY    . BREAST BIOPSY Right 11/03/2005   core biopsy w/ clip placement  . CHOLECYSTECTOMY    . COLONOSCOPY    . COLONOSCOPY WITH PROPOFOL N/A 11/05/2015   Procedure: COLONOSCOPY WITH PROPOFOL;  Surgeon: Lollie Sails, MD;  Location: [facility] ENDOSCOPY;  Service: Endoscopy;  Laterality:  N/A;  . KNEE ARTHROSCOPY Right 02/21/08  . TOTAL HIP ARTHROPLASTY Right 04/09/2018   Procedure: TOTAL HIP ARTHROPLASTY ANTERIOR APPROACH;  Surgeon: Hessie Knows, MD;  Location: [facility] ORS;  Service: Orthopedics;  Laterality: Right;  . WISDOM TOOTH EXTRACTION      Family History  Problem Relation Age of Onset  . Breast cancer Maternal Aunt        great  . Kidney disease Mother   . Diabetes Mother   . Hypertension Father   . Kidney disease Father   . Diabetes Brother   . Diabetes Paternal Grandmother     Social History Social History   Tobacco Use  . Smoking status: Never Smoker  . Smokeless tobacco: Never Used  Substance Use Topics  . Alcohol use: Yes    Comment: rarely  . Drug use: No    Allergies  Allergen Reactions  . Codeine Itching    Pin and needles     Current Outpatient Medications  Medication Sig Dispense Refill  . acetaminophen (TYLENOL) 500 MG tablet Take 1-2 tablets (500-1,000 mg total) by mouth every 6 (six) hours as needed for mild pain (pain score 1-3 or temp > 100.5).    Marland Kitchen aspirin EC 81 MG tablet Take 1 tablet (81 mg total) by mouth daily.    Marland Kitchen atorvastatin (LIPITOR) 10 MG tablet One by mouth at bedtime TWO nights per week 26 tablet 0  . latanoprost (XALATAN) 0.005 % ophthalmic  solution Place 1 drop into both eyes at bedtime.     Marland Kitchen omeprazole (PRILOSEC) 20 MG capsule Take 1 capsule (20 mg total) by mouth every other day. 45 capsule 0  . OVER THE COUNTER MEDICATION Take 1 tablet by mouth daily as needed (constipation). Swiss Kriss herbal laxative     No current facility-administered medications for this visit.     Review of Systems Review of Systems  All other systems reviewed and are negative. Are as discussed in the history of present illness  Blood pressure (!) 158/84, pulse 73, temperature (!) 97.5 F (36.4 C), resp. rate 14, height _0  (1.575 m), weight 192 lb 12.8 oz (87.5 kg), SpO2 99 %.  Physical Exam Physical Exam Vitals signs reviewed.   Constitutional:      General: She is not in acute distress.    Appearance: Normal appearance. She is obese.  HENT:     Head: Normocephalic and atraumatic.     Nose:     Comments: Covered with a mask secondary to COVID-19 precautions    Mouth/Throat:     Comments: Covered with a mask secondary to COVID-19 precautions Eyes:     General: No scleral icterus.       Right eye: No discharge.        Left eye: No discharge.     Conjunctiva/sclera: Conjunctivae normal.     Comments: She has no proptosis or exophthalmos  Neck:     Musculoskeletal: Normal range of motion.     Comments: The thyroid is slightly enlarged.  No discrete nodules are appreciated.  The gland moves freely with deglutition. Cardiovascular:     Rate and Rhythm: Normal rate and regular rhythm.     Pulses: Normal pulses.     Heart sounds: No murmur.  Pulmonary:     Effort: Pulmonary effort is normal.     Breath sounds: Normal breath sounds.  Abdominal:     General: Bowel sounds are normal.     Palpations: Abdomen is soft.  Genitourinary:    Comments: Deferred Musculoskeletal: Normal range of motion.        General: No swelling.     Right lower leg: No edema.     Left lower leg: Edema present.  Lymphadenopathy:     Cervical: No cervical adenopathy.  Skin:    General: Skin is warm and dry.     Findings: No rash.  Neurological:     General: No focal deficit present.     Mental Status: She is alert and oriented to person, place, and time.  Psychiatric:        Mood and Affect: Mood normal.        Behavior: Behavior normal.        Thought Content: Thought content normal.        Judgment: Judgment normal.     Data Reviewed Results for GLADA, WICKSTROM (MRN [id number]) as of 11/20/2018 15:50  Ref. Range 11/04/2018 07:48  TSH Latest Ref Range: 0.40 - 4.50 mIU/L 0.81  Thyroxine (T4) Latest Ref Range: 5.1 - 11.9 mcg/dL 5.7  Free Thyroxine Index Latest Ref Range: 1.4 - 3.8  1.8  T3 Uptake Latest Ref Range: 22 - 35 % 31    These labs demonstrate that she is euthyroid.  CLINICAL DATA:  68 year old female with a history of thyroid nodule  EXAM: THYROID ULTRASOUND  TECHNIQUE: Ultrasound examination of the thyroid gland and adjacent soft tissues was performed.  COMPARISON:  Carotid duplex 10/23/2018  FINDINGS: Parenchymal Echotexture: Mildly heterogenous  Isthmus: 0.4 cm  Right lobe: 5.2 cm x 1.6 cm x 2.5 cm  Left lobe: 5.0 cm x 1.3 cm x 2.1 cm  _________________________________________________________  Estimated total number of nodules >/= 1 cm: 2  Number of spongiform nodules >/=  2 cm not described below (TR1): 0  Number of mixed cystic and solid nodules >/= 1.5 cm not described below (TR2): 0  _________________________________________________________  Nodule # 1:  Location: Right; Mid  Maximum size: 1.5 cm; Other 2 dimensions: 1.3 cm x 1.2 cm  Composition: solid/almost completely solid (2)  Echogenicity: hypoechoic (2)  Shape: not taller-than-wide (0)  Margins: ill-defined (0)  Echogenic foci: none (0)  ACR TI-RADS total points: 4.  ACR TI-RADS risk category: TR4 (4-6 points).  ACR TI-RADS recommendations:  Nodule meets criteria for biopsy  _________________________________________________________  Nodule # 2:  Location: Right; Inferior  Maximum size: 1.1 cm; Other 2 dimensions: 1.1 cm x 0.6 cm  Composition: solid/almost completely solid (2)  Echogenicity: hypoechoic (2)  Shape: not taller-than-wide (0)  Margins: ill-defined (0)  Echogenic foci: none (0)  ACR TI-RADS total points: 4.  ACR TI-RADS risk category: TR4 (4-6 points).  ACR TI-RADS recommendations:  Nodule meets criteria for surveillance  _________________________________________________________  Nodule # 3:  Location: Right; Inferior  Maximum size: 0.7 cm; Other 2 dimensions: 0.6 cm x 0.4 cm  Composition: mixed cystic and solid  (1)  Echogenicity: hypoechoic (2)  Shape: not taller-than-wide (0)  Margins: smooth (0)  Echogenic foci: none (0)  ACR TI-RADS total points: 3.  ACR TI-RADS risk category: TR3 (3 points).  ACR TI-RADS recommendations:  Nodule does not meet criteria for surveillance or biopsy  _________________________________________________________  Nodule # 4:  Location: Left; Superior  Maximum size: 0.7 cm; Other 2 dimensions: 0.7 cm x 0.4 cm  Composition: cannot determine (2)  Echogenicity: hypoechoic (2)  Shape: not taller-than-wide (0)  Margins: smooth (0)  Echogenic foci: none (0)  ACR TI-RADS total points: 4.  ACR TI-RADS risk category: TR4 (4-6 points).  ACR TI-RADS recommendations:  Nodule does not meet criteria for surveillance or biopsy  _________________________________________________________  Nodule # 5:  Location: Left; Mid  Maximum size: 0.9 cm; Other 2 dimensions: 0.8 cm x 0.8 cm  Composition: cystic/almost completely cystic (0)  Echogenicity: hypoechoic (2)  Shape: not taller-than-wide (0)  Margins: smooth (0)  Echogenic foci: none (0)  ACR TI-RADS total points: 2.  ACR TI-RADS risk category: TR2 (2 points).  ACR TI-RADS recommendations:  Nodule does not meet criteria for surveillance or biopsy  _________________________________________________________  No adenopathy  IMPRESSION: Right mid thyroid nodule (labeled 1) meets criteria for biopsy, as designated by the newly established ACR TI-RADS criteria, and referral for biopsy is recommended.  Right inferior thyroid nodule (labeled 2) meets criteria for surveillance, as designated by the newly established ACR TI-RADS criteria. Surveillance ultrasound study recommended to be performed annually up to 5 years.  Recommendations follow those established by the new ACR TI-RADS criteria (J Am Coll Radiol 0865;78:469-629).   Electronically  Signed   By: Corrie Mckusick D.O.   On: 11/12/2018 17:33  I personally reviewed the thyroid ultrasound imaging.  I agree with the radiologist's assessment.  The right thyroid nodule that is more superior in the pole does meet criteria for biopsy.  Assessment Patricia Richardson is a 68 year old woman who had thyroid nodules discovered incidentally on a carotid ultrasound.  She is euthyroid and is otherwise asymptomatic.  The right superior  nodule meets criteria for biopsy.  Plan I performed an ultrasound-guided fine-needle aspiration biopsy of the more superior right-sided thyroid nodule today.  I also collected a sample for molecular diagnostic testing (ThyroSeq).  I will contact her when I have these results.  If no surgery is indicated, she should have a repeat ultrasound performed in 1 year for ongoing surveillance of this nodule and the more inferior one on the right.   Fredirick Maudlin 11/20/2018, 3:44 PM

## 2018-11-22 ENCOUNTER — Telehealth: Payer: Self-pay | Admitting: General Surgery

## 2018-11-22 ENCOUNTER — Encounter: Payer: Self-pay | Admitting: General Surgery

## 2018-11-22 NOTE — Telephone Encounter (Signed)
Tried to reach patient to give FNAB results. No answer. LM on VM for her to return my call. Also sent MyChart message.

## 2018-12-15 ENCOUNTER — Other Ambulatory Visit: Payer: Self-pay | Admitting: Family Medicine

## 2019-01-08 DIAGNOSIS — M1711 Unilateral primary osteoarthritis, right knee: Secondary | ICD-10-CM | POA: Diagnosis not present

## 2019-01-13 DIAGNOSIS — H40153 Residual stage of open-angle glaucoma, bilateral: Secondary | ICD-10-CM | POA: Diagnosis not present

## 2019-02-05 ENCOUNTER — Other Ambulatory Visit: Payer: Self-pay | Admitting: Family Medicine

## 2019-02-05 DIAGNOSIS — Z1231 Encounter for screening mammogram for malignant neoplasm of breast: Secondary | ICD-10-CM

## 2019-02-06 ENCOUNTER — Ambulatory Visit: Payer: Medicare Other

## 2019-02-06 ENCOUNTER — Ambulatory Visit (INDEPENDENT_AMBULATORY_CARE_PROVIDER_SITE_OTHER): Payer: Medicare Other

## 2019-02-06 ENCOUNTER — Other Ambulatory Visit: Payer: Self-pay

## 2019-02-06 DIAGNOSIS — Z23 Encounter for immunization: Secondary | ICD-10-CM | POA: Diagnosis not present

## 2019-02-19 DIAGNOSIS — R1310 Dysphagia, unspecified: Secondary | ICD-10-CM | POA: Diagnosis not present

## 2019-02-19 DIAGNOSIS — Z01818 Encounter for other preprocedural examination: Secondary | ICD-10-CM | POA: Diagnosis not present

## 2019-02-19 DIAGNOSIS — Z86010 Personal history of colonic polyps: Secondary | ICD-10-CM | POA: Diagnosis not present

## 2019-02-19 DIAGNOSIS — K219 Gastro-esophageal reflux disease without esophagitis: Secondary | ICD-10-CM | POA: Diagnosis not present

## 2019-02-25 ENCOUNTER — Ambulatory Visit: Payer: Medicare Other

## 2019-03-04 ENCOUNTER — Other Ambulatory Visit: Payer: Self-pay

## 2019-03-04 ENCOUNTER — Ambulatory Visit (INDEPENDENT_AMBULATORY_CARE_PROVIDER_SITE_OTHER): Payer: Medicare Other

## 2019-03-04 VITALS — BP 142/86 | HR 73 | Temp 97.1°F | Resp 16 | Ht 62.0 in | Wt 192.2 lb

## 2019-03-04 DIAGNOSIS — Z0000 Encounter for general adult medical examination without abnormal findings: Secondary | ICD-10-CM | POA: Diagnosis not present

## 2019-03-04 NOTE — Progress Notes (Signed)
Subjective:   Patricia Richardson is a 68 y.o. female who presents for Medicare Annual (Subsequent) preventive examination.   Review of Systems:   Cardiac Risk Factors include: advanced age (>68men, >61 women);dyslipidemia;obesity (BMI >30kg/m2)     Objective:     Vitals: BP (!) 142/86 (BP Location: Left Arm, Patient Position: Sitting, Cuff Size: Normal)   Pulse 73   Temp (!) 97.1 F (36.2 C) (Oral)   Resp 16   Ht 5\' 2"  (1.575 m)   Wt 192 lb 3.2 oz (87.2 kg)   SpO2 99%   BMI 35.15 kg/m   Body mass index is 35.15 kg/m.  Advanced Directives 03/04/2019 04/09/2018 03/29/2018  Does Patient Have a Medical Advance Directive? Yes Yes Yes  Type of Paramedic of [place];Living will [place];Living will [place];Living will  Does patient want to make changes to medical advance directive? - No - Patient declined -  Copy of [place] in Chart? No - copy requested - -    Tobacco Social History   Tobacco Use  Smoking Status Never Smoker  Smokeless Tobacco Never Used     Counseling given: Not Answered   Clinical Intake:  Pre-visit preparation completed: Yes  Pain : No/denies pain     BMI - recorded: 35.15 Nutritional Status: BMI > 30  Obese Nutritional Risks: None Diabetes: No  How often do you need to have someone help you when you read instructions, pamphlets, or other written materials from your doctor or pharmacy?: 1 - Never  Interpreter Needed?: No  Information entered by :: Clemetine Marker LPN  Past Medical History:  Diagnosis Date  . 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 Arthritis   . Cystocele   . Depression   . GERD (gastroesophageal reflux disease)   . Glaucoma   . History of chicken pox   . Hypertension   . 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 ABDOMINAL HYSTERECTOMY    . BREAST BIOPSY Right 11/03/2005   core biopsy w/ clip placement   . CHOLECYSTECTOMY    . COLONOSCOPY    . COLONOSCOPY WITH PROPOFOL N/A 11/05/2015   Procedure: COLONOSCOPY WITH PROPOFOL;  Surgeon: Lollie Sails, MD;  Location[facility] ENDOSCOPY;  Service: Endoscopy;  Laterality: N/A;  . KNEE ARTHROSCOPY Right 02/21/08  . TOTAL HIP ARTHROPLASTY Right 04/09/2018   Procedure: TOTAL HIP ARTHROPLASTY ANTERIOR APPROACH;  Surgeon: Hessie Knows, MD;  Location: [facility] ORS;  Service: Orthopedics;  Laterality: Right;  . WISDOM TOOTH EXTRACTION     Family History  Problem Relation Age of Onset  . Breast cancer Maternal Aunt        great  . Kidney disease Mother   . Diabetes Mother   . Hypertension Father   . Kidney disease Father   . Diabetes Brother   . Diabetes Paternal Grandmother    Social History   Socioeconomic History  . Marital status: Divorced    Spouse name: Not on file  . Number of children: 2  . Years of education: 69  . Highest education level: Not on file  Occupational History  . Not on file  Social Needs  . Financial resource strain: Not hard at all  . Food insecurity    Worry: Never true    Inability: Never true  . Transportation needs    Medical: No    Non-medical: No  Tobacco Use  .  Smoking status: Never Smoker  . Smokeless tobacco: Never Used  Substance and Sexual Activity  . Alcohol use: Yes    Comment: rarely  . Drug use: No  . Sexual activity: Not Currently  Lifestyle  . Physical activity    Days per week: 0 days    Minutes per session: 0 min  . Stress: Not at all  Relationships  . Social Herbalist on phone: Twice a week    Gets together: Twice a week    Attends religious service: More than 4 times per year    Active member of club or organization: No    Attends meetings of clubs or organizations: Never    Relationship status: Divorced  Other Topics Concern  . Not on file  Social History Narrative  . Not on file    Outpatient Encounter Medications as of 03/04/2019  Medication Sig  . acetaminophen  (TYLENOL) 500 MG tablet Take 1-2 tablets (500-1,000 mg total) by mouth every 6 (six) hours as needed for mild pain (pain score 1-3 or temp > 100.5).  Marland Kitchen aspirin EC 81 MG tablet Take 1 tablet (81 mg total) by mouth daily.  Marland Kitchen atorvastatin (LIPITOR) 10 MG tablet TAKE 1 TABLET BY MOUTH AT BEDTIME 2 NIGHTS PER WEEK  . latanoprost (XALATAN) 0.005 % ophthalmic solution Place 1 drop into both eyes at bedtime.   Marland Kitchen omeprazole (PRILOSEC) 20 MG capsule TAKE 1 CAPSULE (20 MG TOTAL) BY MOUTH EVERY OTHER DAY.  Marland Kitchen OVER THE COUNTER MEDICATION Take 1 tablet by mouth daily as needed (constipation). Swiss Kriss herbal laxative   No facility-administered encounter medications on file as of 03/04/2019.     Activities of Daily Living In your present state of health, do you have any difficulty performing the following activities: 03/04/2019 09/23/2018  Hearing? N N  Comment declines hearing aids -  Vision? N N  Difficulty concentrating or making decisions? N N  Walking or climbing stairs? N N  Dressing or bathing? N N  Doing errands, shopping? N N  Preparing Food and eating ? N -  Using the Toilet? N -  In the past six months, have you accidently leaked urine? Y -  Comment urgency -  Do you have problems with loss of bowel control? N -  Managing your Medications? N -  Managing your Finances? N -  Housekeeping or managing your Housekeeping? N -  Some recent data might be hidden    Patient Care Team: Lada, Satira Anis, MD as PCP - General (Family Medicine)    Assessment:   This is a routine wellness examination for Faithann.  Exercise Activities and Dietary recommendations Current Exercise Habits: The patient does not participate in regular exercise at present, Exercise limited by: None identified  Goals    . Increase physical activity     Recommend increasing physical activity to at least 3 days per week       Fall Risk Fall Risk  03/04/2019 11/20/2018 09/23/2018 06/17/2018  Falls in the past year? 0 0 0 0   Number falls in past yr: 0 0 - 0  Injury with Fall? 0 0 - 0  Follow up Falls prevention discussed - - -   FALL RISK PREVENTION PERTAINING TO THE HOME:  Any stairs in or around the home? Yes  If so, do they handrails? Yes   Home free of loose throw rugs in walkways, pet beds, electrical cords, etc? Yes  Adequate lighting in your home  to reduce risk of falls? Yes   ASSISTIVE DEVICES UTILIZED TO PREVENT FALLS:  Life alert? No  Use of a cane, walker or w/c? No  Grab bars in the bathroom? Yes  Shower chair or bench in shower? Yes  Elevated toilet seat or a handicapped toilet? Yes   DME ORDERS:  DME order needed?  No   TIMED UP AND GO:  Was the test performed? Yes .  Length of time to ambulate 10 feet: 5 sec.   GAIT:  Appearance of gait: Gait stead-fast and without the use of an assistive device.   Education: Fall risk prevention has been discussed.  Intervention(s) required? No   Depression Screen PHQ 2/9 Scores 03/04/2019 06/17/2018  PHQ - 2 Score 0 1  PHQ- 9 Score - 2     Cognitive Function     6CIT Screen 03/04/2019  What Year? 0 points  What month? 0 points  What time? 0 points  Count back from 20 0 points  Months in reverse 0 points  Repeat phrase 0 points  Total Score 0    Immunization History  Administered Date(s) Administered  . Fluad Quad(high Dose 65+) 02/06/2019  . Influenza Split 03/01/2015  . Influenza, High Dose Seasonal PF 04/10/2018  . Influenza-Unspecified 03/06/2014, 03/06/2016, 03/13/2017  . Pneumococcal Conjugate-13 02/06/2019  . Pneumococcal Polysaccharide-23 02/25/2014, 03/13/2017  . Tdap 03/01/2015  . Zoster 11/04/2015    Qualifies for Shingles Vaccine? Yes  Zostavax completed 2017. Due for Shingrix. Education has been provided regarding the importance of this vaccine. Pt has been advised to call insurance company to determine out of pocket expense. Advised may also receive vaccine at local pharmacy or Health Dept. Verbalized  acceptance and understanding.  Tdap: Up to date  Flu Vaccine: Up to date  Pneumococcal Vaccine: Up to date  Screening Tests Health Maintenance  Topic Date Due  . DEXA SCAN  08-Nov-1950  . COLONOSCOPY  11/05/2018  . MAMMOGRAM  03/06/2019  . TETANUS/TDAP  02/28/2025  . INFLUENZA VACCINE  Completed  . Hepatitis C Screening  Completed  . PNA vac Low Risk Adult  Completed   Cancer Screenings:  Colorectal Screening: Completed 11/05/15. Repeat every 3 years; Scheduled for 03/28/19  Mammogram: Completed 03/05/18. Repeat every year; scheduled for 03/12/19  Bone Density: Ordered 06/17/18. Pt provided with contact information and advised to call to schedule appt.   Lung Cancer Screening: (Low Dose CT Chest recommended if Age 67-80 years, 30 pack-year currently smoking OR have quit w/in 15years.) does not qualify.    Additional Screening:  Hepatitis C Screening: does qualify; Completed 11/04/18  Vision Screening: Recommended annual ophthalmology exams for early detection of glaucoma and other disorders of the eye. Is the patient up to date with their annual eye exam?  Yes  Who is the provider or what is the name of the office in which the pt attends annual eye exams? Dr. Gloriann Loan  Dental Screening: Recommended annual dental exams for proper oral hygiene  Community Resource Referral:  CRR required this visit?  No       Plan:     I have personally reviewed and addressed the Medicare Annual Wellness questionnaire and have noted the following in the patient's chart:  A. Medical and social history B. Use of alcohol, tobacco or illicit drugs  C. Current medications and supplements D. Functional ability and status E.  Nutritional status F.  Physical activity G. Advance directives H. List of other physicians I.  Hospitalizations, surgeries, and ER  visits in previous 12 months J.  [place] such as hearing and vision if needed, cognitive and depression L. Referrals and  appointments   In addition, I have reviewed and discussed with patient certain preventive protocols, quality metrics, and best practice recommendations. A written personalized care plan for preventive services as well as general preventive health recommendations were provided to patient.   Signed,  Clemetine Marker, LPN Nurse Health Advisor   Nurse Notes: pt doing well and appreciative of visit today

## 2019-03-04 NOTE — Patient Instructions (Signed)
Patricia Richardson , Thank you for taking time to come for your Medicare Wellness Visit. I appreciate your ongoing commitment to your health goals. Please review the following plan we discussed and let me know if I can assist you in the future.   Screening recommendations/referrals: Colonoscopy: done 6/2/1. Scheduled for 03/28/19 Mammogram: done 03/05/18. Scheduled for 03/12/19 Bone Density: Please call [phone] to schedule your bone density screening.  Recommended yearly ophthalmology/optometry visit for glaucoma screening and checkup Recommended yearly dental visit for hygiene and checkup  Vaccinations: Influenza vaccine: done 02/06/19 Pneumococcal vaccine: done 02/06/19 Tdap vaccine: done 03/01/15 Shingles vaccine: Shingrix discussed. Please contact your pharmacy for coverage information.   Advanced directives: Please bring a copy of your health care power of attorney and living will to the office at your convenience.  Conditions/risks identified: Recommend increasing physical activity to at least 3 days per week.   Next appointment: Please follow up in one year for your Medicare Annual Wellness visit.     Preventive Care 43 Years and Older, Female Preventive care refers to lifestyle choices and visits with your health care provider that can promote health and wellness. What does preventive care include?  A yearly physical exam. This is also called an annual well check.  Dental exams once or twice a year.  Routine eye exams. Ask your health care provider how often you should have your eyes checked.  Personal lifestyle choices, including:  Daily care of your teeth and gums.  Regular physical activity.  Eating a healthy diet.  Avoiding tobacco and drug use.  Limiting alcohol use.  Practicing safe sex.  Taking low-dose aspirin every day.  Taking vitamin and mineral supplements as recommended by your health care provider. What happens during an annual well check? The services  and screenings done by your health care provider during your annual well check will depend on your age, overall health, lifestyle risk factors, and family history of disease. Counseling  Your health care provider may ask you questions about your:  Alcohol use.  Tobacco use.  Drug use.  Emotional well-being.  Home and relationship well-being.  Sexual activity.  Eating habits.  History of falls.  Memory and ability to understand (cognition).  Work and work Statistician.  Reproductive health. Screening  You may have the following tests or measurements:  Height, weight, and BMI.  Blood pressure.  Lipid and cholesterol levels. These may be checked every 5 years, or more frequently if you are over 66 years old.  Skin check.  Lung cancer screening. You may have this screening every year starting at age 57 if you have a 30-pack-year history of smoking and currently smoke or have quit within the past 15 years.  Fecal occult blood test (FOBT) of the stool. You may have this test every year starting at age 56.  Flexible sigmoidoscopy or colonoscopy. You may have a sigmoidoscopy every 5 years or a colonoscopy every 10 years starting at age 37.  Hepatitis C blood test.  Hepatitis B blood test.  Sexually transmitted disease (STD) testing.  Diabetes screening. This is done by checking your blood sugar (glucose) after you have not eaten for a while (fasting). You may have this done every 1-3 years.  Bone density scan. This is done to screen for osteoporosis. You may have this done starting at age 17.  Mammogram. This may be done every 1-2 years. Talk to your health care provider about how often you should have regular mammograms. Talk with your health care  provider about your test results, treatment options, and if necessary, the need for more tests. Vaccines  Your health care provider may recommend certain vaccines, such as:  Influenza vaccine. This is recommended every year.   Tetanus, diphtheria, and acellular pertussis (Tdap, Td) vaccine. You may need a Td booster every 10 years.  Zoster vaccine. You may need this after age 28.  Pneumococcal 13-valent conjugate (PCV13) vaccine. One dose is recommended after age 73.  Pneumococcal polysaccharide (PPSV23) vaccine. One dose is recommended after age 81. Talk to your health care provider about which screenings and vaccines you need and how often you need them. This information is not intended to replace advice given to you by your health care provider. Make sure you discuss any questions you have with your health care provider. Document Released: 06/18/2015 Document Revised: 02/09/2016 Document Reviewed: 03/23/2015 Elsevier Interactive Patient Education  2017 [place] Prevention in the Home Falls can cause injuries. They can happen to people of all ages. There are many things you can do to make your home safe and to help prevent falls. What can I do on the outside of my home?  Regularly fix the edges of walkways and driveways and fix any cracks.  Remove anything that might make you trip as you walk through a door, such as a raised step or threshold.  Trim any bushes or trees on the path to your home.  Use bright outdoor lighting.  Clear any walking paths of anything that might make someone trip, such as rocks or tools.  Regularly check to see if handrails are loose or broken. Make sure that both sides of any steps have handrails.  Any raised decks and porches should have guardrails on the edges.  Have any leaves, snow, or ice cleared regularly.  Use sand or salt on walking paths during winter.  Clean up any spills in your garage right away. This includes oil or grease spills. What can I do in the bathroom?  Use night lights.  Install grab bars by the toilet and in the tub and shower. Do not use towel bars as grab bars.  Use non-skid mats or decals in the tub or shower.  If you need to  sit down in the shower, use a plastic, non-slip stool.  Keep the floor dry. Clean up any water that spills on the floor as soon as it happens.  Remove soap buildup in the tub or shower regularly.  Attach bath mats securely with double-sided non-slip rug tape.  Do not have throw rugs and other things on the floor that can make you trip. What can I do in the bedroom?  Use night lights.  Make sure that you have a light by your bed that is easy to reach.  Do not use any sheets or blankets that are too big for your bed. They should not hang down onto the floor.  Have a firm chair that has side arms. You can use this for support while you get dressed.  Do not have throw rugs and other things on the floor that can make you trip. What can I do in the kitchen?  Clean up any spills right away.  Avoid walking on wet floors.  Keep items that you use a lot in easy-to-reach places.  If you need to reach something above you, use a strong step stool that has a grab bar.  Keep electrical cords out of the way.  Do not use floor polish  or wax that makes floors slippery. If you must use wax, use non-skid floor wax.  Do not have throw rugs and other things on the floor that can make you trip. What can I do with my stairs?  Do not leave any items on the stairs.  Make sure that there are handrails on both sides of the stairs and use them. Fix handrails that are broken or loose. Make sure that handrails are as long as the stairways.  Check any carpeting to make sure that it is firmly attached to the stairs. Fix any carpet that is loose or worn.  Avoid having throw rugs at the top or bottom of the stairs. If you do have throw rugs, attach them to the floor with carpet tape.  Make sure that you have a light switch at the top of the stairs and the bottom of the stairs. If you do not have them, ask someone to add them for you. What else can I do to help prevent falls?  Wear shoes that:  Do not  have high heels.  Have rubber bottoms.  Are comfortable and fit you well.  Are closed at the toe. Do not wear sandals.  If you use a stepladder:  Make sure that it is fully opened. Do not climb a closed stepladder.  Make sure that both sides of the stepladder are locked into place.  Ask someone to hold it for you, if possible.  Clearly mark and make sure that you can see:  Any grab bars or handrails.  First and last steps.  Where the edge of each step is.  Use tools that help you move around (mobility aids) if they are needed. These include:  Canes.  Walkers.  Scooters.  Crutches.  Turn on the lights when you go into a dark area. Replace any light bulbs as soon as they burn out.  Set up your furniture so you have a clear path. Avoid moving your furniture around.  If any of your floors are uneven, fix them.  If there are any pets around you, be aware of where they are.  Review your medicines with your doctor. Some medicines can make you feel dizzy. This can increase your chance of falling. Ask your doctor what other things that you can do to help prevent falls. This information is not intended to replace advice given to you by your health care provider. Make sure you discuss any questions you have with your health care provider. Document Released: 03/18/2009 Document Revised: 10/28/2015 Document Reviewed: 06/26/2014 Elsevier Interactive Patient Education  2017 [employer].

## 2019-03-12 ENCOUNTER — Ambulatory Visit
Admission: RE | Admit: 2019-03-12 | Discharge: 2019-03-12 | Disposition: A | Discharge status: Home / Self Care | Payer: Medicare Other | Source: Ambulatory Visit | Attending: Family Medicine | Admitting: Family Medicine

## 2019-03-12 ENCOUNTER — Other Ambulatory Visit: Payer: Self-pay | Admitting: Family Medicine

## 2019-03-12 DIAGNOSIS — E2839 Other primary ovarian failure: Secondary | ICD-10-CM | POA: Insufficient documentation

## 2019-03-12 DIAGNOSIS — Z1231 Encounter for screening mammogram for malignant neoplasm of breast: Secondary | ICD-10-CM | POA: Diagnosis present

## 2019-03-24 ENCOUNTER — Encounter: Payer: Self-pay | Admitting: Family Medicine

## 2019-03-24 ENCOUNTER — Other Ambulatory Visit: Payer: Self-pay

## 2019-03-24 ENCOUNTER — Ambulatory Visit (INDEPENDENT_AMBULATORY_CARE_PROVIDER_SITE_OTHER): Payer: Medicare Other | Admitting: Family Medicine

## 2019-03-24 DIAGNOSIS — Z5329 Procedure and treatment not carried out because of patient's decision for other reasons: Secondary | ICD-10-CM

## 2019-03-25 NOTE — Progress Notes (Signed)
Same day cancellation

## 2019-04-01 ENCOUNTER — Other Ambulatory Visit: Payer: Self-pay

## 2019-04-01 ENCOUNTER — Ambulatory Visit (INDEPENDENT_AMBULATORY_CARE_PROVIDER_SITE_OTHER): Payer: Medicare Other | Admitting: Family Medicine

## 2019-04-01 ENCOUNTER — Encounter: Payer: Self-pay | Admitting: Family Medicine

## 2019-04-01 VITALS — BP 138/80 | HR 72 | Temp 97.9°F | Resp 16 | Ht 62.0 in | Wt 192.3 lb

## 2019-04-01 DIAGNOSIS — E669 Obesity, unspecified: Secondary | ICD-10-CM | POA: Diagnosis not present

## 2019-04-01 DIAGNOSIS — E042 Nontoxic multinodular goiter: Secondary | ICD-10-CM

## 2019-04-01 DIAGNOSIS — R7303 Prediabetes: Secondary | ICD-10-CM

## 2019-04-01 DIAGNOSIS — K219 Gastro-esophageal reflux disease without esophagitis: Secondary | ICD-10-CM

## 2019-04-01 DIAGNOSIS — Z9189 Other specified personal risk factors, not elsewhere classified: Secondary | ICD-10-CM

## 2019-04-01 DIAGNOSIS — G252 Other specified forms of tremor: Secondary | ICD-10-CM

## 2019-04-01 DIAGNOSIS — Z8673 Personal history of transient ischemic attack (TIA), and cerebral infarction without residual deficits: Secondary | ICD-10-CM | POA: Diagnosis not present

## 2019-04-01 MED ORDER — ATORVASTATIN CALCIUM 10 MG PO TABS: ORAL_TABLET | ORAL | 3 refills | Status: DC

## 2019-04-01 MED ORDER — OMEPRAZOLE 20 MG PO CPDR: 20.0000 mg | DELAYED_RELEASE_CAPSULE | ORAL | 3 refills | Status: DC

## 2019-04-01 NOTE — Progress Notes (Signed)
Name: Patricia Richardson   MRN: [id number]    DOB: 1950/10/13   Date:04/01/2019       Progress Note  Subjective  Chief Complaint  Chief Complaint  Patient presents with  . Hyperlipidemia    6 month recheck  . Gastroesophageal Reflux    HPI  HLD: She has history of stroke in 2003 without residual effects.  She is taking atorvastatin 10mg  twice weekly and asiprin 81mg  without any issues.  She is taking statin therapy to try to bring her LDL dwn below 70.    GERD:  She is doing well on omeprazole every other day. Denies difficulty swallowing, abdominal pain, NV, regurgitation, no blood in stool  LEFT Hand Tremor: She has noticed a very slight tremor to the left hand that started a couple of months ago.  She notes her mother had similar tremor in the left hand. She states it is not a constant tremor.  She notes she can calm the tremor with intentional movement and/or with using her other hand to touch/calm the left hand.  She would like to wait on referral/medication at this time - we will have her monitor for any worsening; consider neuro referral if needed.   History prediabetes/Obesity: Does have history of elevated A1C of 5.8% 9 months ago, then it came down to 5.5% 4 months ago.  She have extensive family history of DM and would like labs today.  Denies polyuria, polydipsia, or polyphagia.  Multiple Thyroid Nodules: Saw Dr. Celine Ahr for Biopsy in June 2020 which was benighn; recommended 1 year [place] repeat for surveillance.   HM: Waiting on colonocopy results; had mammogram and DEXA recently that were both normal.  Patient Active Problem List   Diagnosis Date Noted  . Current use of proton pump inhibitor 09/23/2018  . Prediabetes 06/20/2018  . At risk for heart disease 06/20/2018  . Essential hypertension, benign 06/17/2018  . Hx of completed stroke 06/17/2018  . Obesity (BMI 35.0-39.9 without comorbidity) 06/17/2018  . 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 Primary  osteoarthritis of right hip 03/12/2017  . Dyspnea on exertion 03/01/2015  . Chronic insomnia 09/28/2014  . Anxiety 07/29/2014  . Bleeding hemorrhoids 07/29/2014    Past Surgical History:  Procedure Laterality Date  . ABDOMINAL HYSTERECTOMY    . BREAST BIOPSY Right 11/03/2005   core biopsy w/ clip placement  . CHOLECYSTECTOMY    . COLONOSCOPY    . COLONOSCOPY WITH PROPOFOL N/A 11/05/2015   Procedure: COLONOSCOPY WITH PROPOFOL;  Surgeon: Lollie Sails, MD;  Location: [facility] ENDOSCOPY;  Service: Endoscopy;  Laterality: N/A;  . KNEE ARTHROSCOPY Right 02/21/08  . TOTAL HIP ARTHROPLASTY Right 04/09/2018   Procedure: TOTAL HIP ARTHROPLASTY ANTERIOR APPROACH;  Surgeon: Hessie Knows, MD;  Location: [facility] ORS;  Service: Orthopedics;  Laterality: Right;  . WISDOM TOOTH EXTRACTION      Family History  Problem Relation Age of Onset  . Breast cancer Maternal Aunt        great  . Kidney disease Mother   . Diabetes Mother   . Hypertension Father   . Kidney disease Father   . Diabetes Brother   . Diabetes Paternal Grandmother     Social History   Socioeconomic History  . Marital status: Divorced    Spouse name: Not on file  . Number of children: 2  . Years of education: 7  . Highest education level: Not on file  Occupational History  . Not on file  Social Needs  . Financial resource strain: Not hard at all  . Food insecurity    Worry: Never true    Inability: Never true  . Transportation needs    Medical: No    Non-medical: No  Tobacco Use  . Smoking status: Never Smoker  . Smokeless tobacco: Never Used  Substance and Sexual Activity  . Alcohol use: Yes    Comment: rarely  . Drug use: No  . Sexual activity: Not Currently  Lifestyle  . Physical activity    Days per week: 0 days    Minutes per session: 0 min  . Stress: Not at all  Relationships  . Social Herbalist on phone: Twice a week    Gets together: Twice a week    Attends religious service: More than 4  times per year    Active member of club or organization: No    Attends meetings of clubs or organizations: Never    Relationship status: Divorced  . Intimate partner violence    Fear of current or ex partner: No    Emotionally abused: No    Physically abused: No    Forced sexual activity: No  Other Topics Concern  . Not on file  Social History Narrative  . Not on file     Current Outpatient Medications:  .  acetaminophen (TYLENOL) 500 MG tablet, Take 1-2 tablets (500-1,000 mg total) by mouth every 6 (six) hours as needed for mild pain (pain score 1-3 or temp > 100.5)., Disp: , Rfl:  .  aspirin EC 81 MG tablet, Take 1 tablet (81 mg total) by mouth daily., Disp: , Rfl:  .  atorvastatin (LIPITOR) 10 MG tablet, TAKE 1 TABLET BY MOUTH AT BEDTIME 2 NIGHTS PER WEEK, Disp: 26 tablet, Rfl: 0 .  latanoprost (XALATAN) 0.005 % ophthalmic solution, Place 1 drop into both eyes at bedtime. , Disp: , Rfl:  .  omeprazole (PRILOSEC) 20 MG capsule, TAKE 1 CAPSULE (20 MG TOTAL) BY MOUTH EVERY OTHER DAY., Disp: 45 capsule, Rfl: 0 .  OVER THE COUNTER MEDICATION, Take 1 tablet by mouth daily as needed (constipation). Swiss Kriss herbal laxative, Disp: , Rfl:   Allergies  Allergen Reactions  . Codeine Itching    Pin and needles     I personally reviewed active problem list, medication list, allergies, notes from last encounter, lab results with the patient/caregiver today.   ROS  Constitutional: Negative for fever or weight change.  Respiratory: Negative for cough and shortness of breath.   Cardiovascular: Negative for chest pain or palpitations.  Gastrointestinal: Negative for abdominal pain, no bowel changes.  Musculoskeletal: Negative for gait problem or joint swelling.  Skin: Negative for rash.  Neurological: Negative for dizziness or headache.  No other specific complaints in a complete review of systems (except as listed in HPI above).  Objective  Vitals:   04/01/19 1049  BP: 138/80   Pulse: 72  Resp: 16  Temp: 97.9 F (36.6 C)  TempSrc: Temporal  SpO2: 99%  Weight: 192 lb 4.8 oz (87.2 kg)  Height: 5\' 2"  (1.575 m)    Body mass index is 35.17 kg/m.  Physical Exam Constitutional: Patient appears well-developed and well-nourished. No distress.  HENT: Head: Normocephalic and atraumatic.  Eyes: Conjunctivae and EOM are normal. No scleral icterus.  Neck: Normal range of motion. Neck supple. No JVD present. No thyromegaly present.  Cardiovascular: Normal rate, regular rhythm and normal heart sounds.  No murmur heard. No  BLE edema. Pulmonary/Chest: Effort normal and breath sounds normal. No respiratory distress. Musculoskeletal: Normal range of motion, no joint effusions. No gross deformities Neurological: Pt is alert and oriented to person, place, and time. No cranial nerve deficit. Coordination, balance, strength, speech and gait are normal. Tremor is not visible at rest in the examination room, though she does state it is intermittent at this time. Skin: Skin is warm and dry. No rash noted. No erythema.  Psychiatric: Patient has a normal mood and affect. behavior is normal. Judgment and thought content normal.  No results found for this or any previous visit (from the past 72 hour(s)).  PHQ2/9: Depression screen [facility] 2/9 04/01/2019 03/04/2019 06/17/2018  Decreased Interest 0 0 0  Down, Depressed, Hopeless 0 0 1  PHQ - 2 Score 0 0 1  Altered sleeping 0 - 0  Tired, decreased energy 0 - 0  Change in appetite 0 - 1  Feeling bad or failure about yourself  0 - 0  Trouble concentrating 0 - 0  Moving slowly or fidgety/restless 0 - 0  Suicidal thoughts 0 - 0  PHQ-9 Score 0 - 2  Difficult doing work/chores Not difficult at all - Not difficult at all   PHQ-2/9 Result is negative.    Fall Risk: Fall Risk  04/01/2019 03/04/2019 11/20/2018 09/23/2018 06/17/2018  Falls in the past year? 0 0 0 0 0  Number falls in past yr: 0 0 0 - 0  Injury with Fall? 0 0 0 - 0  Follow up  Falls evaluation completed Falls prevention discussed - - -    Assessment & Plan  1. Prediabetes - COMPLETE METABOLIC PANEL WITH GFR - Hemoglobin A1c  2. Obesity (BMI 35.0-39.9 without comorbidity) - Discussed importance of 150 minutes of physical activity weekly, eat two servings of fish weekly, eat one serving of tree nuts ( cashews, pistachios, pecans, almonds.Marland Kitchen) every other day, eat 6 servings of fruit/vegetables daily and drink plenty of water and avoid sweet beverages.   3. Hx of completed stroke - Stable since 2003; taking statin therapy with LDL goal <70.   - Lipid panel - atorvastatin (LIPITOR) 10 MG tablet; Take 1 tablet twice weekly  Dispense: 26 tablet; Refill: 3  4. Gastroesophageal reflux disease without esophagitis - Stable on every other day omeprazole; has tried to come off in the past without success; will maintain at this time. - COMPLETE METABOLIC PANEL WITH GFR - omeprazole (PRILOSEC) 20 MG capsule; Take 1 capsule (20 mg total) by mouth every other day.  Dispense: 45 capsule; Refill: 3  5. Multiple thyroid nodules - Repeat US in 1 year; biopsy with Dr. Celine Ahr was benign.  6. Resting tremor - She will monitor for the next 1 month and report back if worsening.  Her mother had very similar LEFT handed resting tremor as she aged, and she feels this is similar. No family history of Parkinson Disease.  She would like to wait on Neuro referral, but we will place in the future if she would like to move forward with this.

## 2019-04-02 ENCOUNTER — Encounter: Payer: Self-pay | Admitting: Family Medicine

## 2019-04-11 LAB — COMPLETE METABOLIC PANEL WITH GFR
AG Ratio: 1.5 (calc) (ref 1.0–2.5)
ALT: 13 U/L (ref 6–29)
AST: 15 U/L (ref 10–35)
Albumin: 4.1 g/dL (ref 3.6–5.1)
Alkaline phosphatase (APISO): 67 U/L (ref 37–153)
BUN: 13 mg/dL (ref 7–25)
CO2: 20 mmol/L (ref 20–32)
Calcium: 9.8 mg/dL (ref 8.6–10.4)
Chloride: 108 mmol/L (ref 98–110)
Creat: 0.75 mg/dL (ref 0.60–0.93)
GFR, Est African American: 94 mL/min/{1.73_m2} (ref >=60)
GFR, Est Non African American: 81 mL/min/{1.73_m2} (ref >=60)
Globulin: 2.8 g/dL (calc) (ref 1.9–3.7)
Glucose, Bld: 85 mg/dL (ref 65–99)
Potassium: 4.5 mmol/L (ref 3.5–5.3)
Sodium: 142 mmol/L (ref 135–146)
Total Bilirubin: 0.6 mg/dL (ref 0.2–1.2)
Total Protein: 6.9 g/dL (ref 6.1–8.1)

## 2019-04-11 LAB — LIPID PANEL
Cholesterol: 144 mg/dL (ref <200)
HDL: 57 mg/dL (ref >=50)
LDL Cholesterol (Calc): 69 mg/dL (calc)
Non-HDL Cholesterol (Calc): 87 mg/dL (calc) (ref <130)
Total CHOL/HDL Ratio: 2.5 (calc) (ref <5.0)
Triglycerides: 98 mg/dL (ref <150)

## 2019-04-11 LAB — HEMOGLOBIN A1C
Hgb A1c MFr Bld: 5.5 % of total Hgb (ref <5.7)
Mean Plasma Glucose: 111 (calc)
eAG (mmol/L): 6.2 (calc)

## 2019-04-18 ENCOUNTER — Other Ambulatory Visit: Payer: Self-pay | Admitting: Family Medicine

## 2019-04-18 DIAGNOSIS — Z8673 Personal history of transient ischemic attack (TIA), and cerebral infarction without residual deficits: Secondary | ICD-10-CM

## 2019-04-18 DIAGNOSIS — K219 Gastro-esophageal reflux disease without esophagitis: Secondary | ICD-10-CM

## 2019-04-18 MED ORDER — ATORVASTATIN CALCIUM 10 MG PO TABS: ORAL_TABLET | ORAL | 3 refills | Status: DC

## 2019-04-18 MED ORDER — OMEPRAZOLE 20 MG PO CPDR: 20.0000 mg | DELAYED_RELEASE_CAPSULE | ORAL | 3 refills | Status: DC

## 2019-04-18 MED ORDER — ASPIRIN EC 81 MG PO TBEC: 81.0000 mg | DELAYED_RELEASE_TABLET | Freq: Every day | ORAL | Status: DC

## 2019-04-18 NOTE — Telephone Encounter (Signed)
Medication refill: omeprazole (PRILOSEC) 20 MG capsule [id number]  aspirin EC 81 MG tablet [id number]    atorvastatin (LIPITOR) 10 MG tablet [id number]   Pharmacy:  [place], [place] [employer] [facility] [phone] (Phone) [phone] (Fax)   Pt changed pharmacy

## 2019-06-05 ENCOUNTER — Other Ambulatory Visit: Payer: Self-pay | Admitting: Family Medicine

## 2019-06-05 DIAGNOSIS — Z8673 Personal history of transient ischemic attack (TIA), and cerebral infarction without residual deficits: Secondary | ICD-10-CM

## 2019-07-07 ENCOUNTER — Encounter: Payer: Self-pay | Admitting: Family Medicine

## 2019-07-07 ENCOUNTER — Other Ambulatory Visit: Payer: Self-pay

## 2019-07-07 ENCOUNTER — Ambulatory Visit (INDEPENDENT_AMBULATORY_CARE_PROVIDER_SITE_OTHER): Payer: Medicare Other | Admitting: Family Medicine

## 2019-07-07 VITALS — BP 180/105 | HR 83 | Ht 62.0 in | Wt 192.0 lb

## 2019-07-07 DIAGNOSIS — R519 Headache, unspecified: Secondary | ICD-10-CM

## 2019-07-07 DIAGNOSIS — I739 Peripheral vascular disease, unspecified: Secondary | ICD-10-CM

## 2019-07-07 DIAGNOSIS — I160 Hypertensive urgency: Secondary | ICD-10-CM | POA: Diagnosis not present

## 2019-07-07 NOTE — Progress Notes (Signed)
Name: Patricia Richardson   MRN: [id number]    DOB: 20-Oct-1950   Date:07/07/2019       Progress Note  Subjective:    Chief Complaint  Chief Complaint  Patient presents with  . Headache    onset 2 weeks, back of head and eyes. sinus pressure    I connected with  Korryn Montanari Panning  on 07/07/19 at 11:40 AM EST by a video enabled telemedicine application and verified that I am speaking with the correct person using two identifiers.  I discussed the limitations of evaluation and management by telemedicine and the availability of in person appointments. The patient expressed understanding and agreed to proceed. Staff also discussed with the patient that there may be a patient responsible charge related to this service. Patient Location: home Provider Location: [facility]  Additional Individuals present: none  HPI She complains of HA for a "a couple of weeks now" (at least two weeks) keep taking sinus medicine and it won't ease up. On OTC meds - antihistamine, alkaseltzer severe sinus gels- ingredients best pt can read/spell them to me includes tylenol 325 mg, dextromethorphan, antihistamine, Phenylephrine She has taken 2 at a time for a few days as well as ibuprofen she has not found that it improved her pain at all She states for the past 2 weeks headache is fairly constant but varies in severity, is described as a pressure and is located all over-  to back of head, to face (points to forehead and cheeks, eyes, sinuses) described as pressure, gradual onset, severity moderate to severe.  At its worst is 9 out of 10 pain level and she will have to lie down. Associated symptoms - sometimes a ringing in her ears Denies associated runny nose, cough, sore throat, fever, chills, sweats, visual disturbances, focal weakness, numbness, slurred speech, facial droop, vertigo, ataxia, confusion, neck pain, chest pain, shortness of breath, lower extremity edema, orthopnea, palpitations.   She has no associated nausea,  vomiting, phonophobia or photophobia, no history of headaches.  She has also noticed increasing blood pressure, much worse over the last couple of days. She has a home BP cuff, she is not sure if she is getting a good reading or not, today she reported blood pressure of 180/105 which is why she got a visit to be evaluated.  BP increasing for a few days - she is hesitant to state begins of range of 150-180/90-105.  No prior or recent meds to tx HTN, mother at her last couple doctor's office visits they have discussed this and patient was hoping to avoid medication and she has been monitoring her blood pressure at home  In reviewing chart I do see that there have been some high readings recently even 160s over 100. Patient denies any known sick contacts or Covid exposure.  She has not gotten a Covid test the last 2 weeks   Patient Active Problem List   Diagnosis Date Noted  . Gastroesophageal reflux disease without esophagitis 04/01/2019  . Multiple thyroid nodules 04/01/2019  . 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 Current use of proton pump inhibitor 09/23/2018  . Prediabetes 06/20/2018  . At risk for heart disease 06/20/2018  . Hx of completed stroke 06/17/2018  . Obesity (BMI 35.0-39.9 without comorbidity) 06/17/2018  . 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 Primary osteoarthritis of right hip 03/12/2017  . Dyspnea on exertion 03/01/2015  . Chronic insomnia 09/28/2014  . Anxiety 07/29/2014  . Bleeding hemorrhoids 07/29/2014  Social History   Tobacco Use  . Smoking status: Never Smoker  . Smokeless tobacco: Never Used  Substance Use Topics  . Alcohol use: Yes    Comment: rarely     Current Outpatient Medications:  .  acetaminophen (TYLENOL) 500 MG tablet, Take 1-2 tablets (500-1,000 mg total) by mouth every 6 (six) hours as needed for mild pain (pain score 1-3 or temp > 100.5)., Disp: , Rfl:  .  aspirin EC 81 MG tablet, Take 1 tablet (81 mg total) by mouth daily.,  Disp: , Rfl:  .  atorvastatin (LIPITOR) 10 MG tablet, TAKE 1 TABLET BY MOUTH AT BEDTIME 2 NIGHTS PER WEEK, Disp: 26 tablet, Rfl: 3 .  latanoprost (XALATAN) 0.005 % ophthalmic solution, Place 1 drop into both eyes at bedtime. , Disp: , Rfl:  .  omeprazole (PRILOSEC) 20 MG capsule, Take 1 capsule (20 mg total) by mouth every other day., Disp: 45 capsule, Rfl: 3 .  OVER THE COUNTER MEDICATION, Take 1 tablet by mouth daily as needed (constipation). Swiss Kriss herbal laxative, Disp: , Rfl:   Allergies  Allergen Reactions  . Codeine Itching    Pin and needles     I personally reviewed active problem list, medication list, allergies, family history, social history, health maintenance, notes from last encounter, lab results, imaging with the patient/caregiver today. Reviewed several months and vital signs and flowsheets  Review of Systems  Constitutional: Negative.  Negative for activity change, appetite change, chills, diaphoresis, fatigue and fever.  HENT: Negative.   Eyes: Negative.   Respiratory: Negative.   Cardiovascular: Negative.   Gastrointestinal: Negative.   Endocrine: Negative.   Genitourinary: Negative.   Musculoskeletal: Negative.   Skin: Negative.   Allergic/Immunologic: Negative.   Neurological: Negative.   Hematological: Negative.   Psychiatric/Behavioral: Negative.   All other systems reviewed and are negative.     Objective:   Virtual encounter, vitals limited, only able to obtain the following Today's Vitals   07/07/19 1131 07/07/19 1133  BP: (!) 180/105   Pulse: 83   Weight: 192 lb (87.1 kg)   Height: 5\' 2"  (1.575 m)   PainSc:  7    Body mass index is 35.12 kg/m. Nursing Note and Vital Signs reviewed.  Physical Exam Vitals and nursing note reviewed.  Constitutional:      General: She is not in acute distress.    Appearance: Normal appearance. She is well-developed. She is not ill-appearing, toxic-appearing or diaphoretic.  HENT:     Head:  Normocephalic and atraumatic.  Eyes:     General:        Right eye: No discharge.        Left eye: No discharge.     Conjunctiva/sclera: Conjunctivae normal.  Neck:     Trachea: No tracheal deviation.  Cardiovascular:     Rate and Rhythm: Normal rate.  Pulmonary:     Effort: Pulmonary effort is normal. No respiratory distress.     Breath sounds: No stridor.  Skin:    Coloration: Skin is not jaundiced or pale.     Findings: No rash.  Neurological:     Mental Status: She is alert.     Comments: Face is symmetrical no visible droop, no slurred speech She is able to walk around her home with seemingly normal gait  Psychiatric:        Mood and Affect: Mood normal.        Behavior: Behavior normal.     PE limited by  telephone encounter  No results found for this or any previous visit (from the past 72 hour(s)).  Assessment and Plan:     ICD-10-CM   1. Hypertensive urgency  I16.0    BP elevated with/out hx of HTN& meds, BP recently worsened over a couple days following moderate to severe HA x2w   Currently no red flags - see HPI, but neuro exam is limited due to virtual encounter  Encouraged her to stop cold medicines, cough medicines, and ibuprofen try pushing fluids Tylenol and possibly Excedrin migraine - monitoring if BP will improve  I did tell her that with both sx the best thing for her to do would be to go today as soon as she can to UC for in person evaluation, but she has someone from the health insurance coming out to her home today - she wants to first wait and recheck her blood pressure.  she wants to see if it is a falsely elevated reading with her home BP cuff -she will send [place] a message regarding that repeated reading  Encouraged her to carefully pay attention to her blood pressure readings and her symptoms.   If blood pressure continues to remain high >160/100-  she needs to go to urgent care for evaluation in person for both HA and HTN -to do good neuro exam,  check for signs of sinus infection, possibly rule out Covid, get lab work etc  If patient holds meds and blood pressure gradually improve <150/90 we will likely need to do a follow-up visit, labs and start medications  I reviewed with the pt at length today concerning signs and symptoms that she should go to urgent care or the ER for at anytime including confusion, focal weakness, numbness, visual disturbances, near syncope, facial droop or slurred speech, chest pain, shortness of breath, palpitations  AVS included this as well - pt does have access to mychart    2. Intractable headache, unspecified chronicity pattern, unspecified headache type   Described as located all over her head and as a pressure but denies any rhinitis or sinusitis symptoms, unclear if it is headache from high blood pressure or if headache has other etiology and is causing blood pressure to increase -encouraged her to get in in person exam at urgent care, unfortunately limited in clinic due to Covid pandemic  Additionally a headache cocktail could see how pain and blood pressure respond prior to treatment of HTN     R51.9      Encourage the patient not to wait longer than today if blood pressure remains this elevated we did discuss increased risk for stroke or heart event and patient verbalized understanding of increased risk and ER and urgent care precautions.  Information concerning headaches and hypertensive urgency and hypertensive emergency concerning signs and symptoms were sent to her through my chart information taken from up-to-date patient handout   - I discussed the assessment and treatment plan with the patient. The patient was provided an opportunity to ask questions and all were answered. The patient agreed with the plan and demonstrated an understanding of the instructions.  I provided 17 minutes of non-face-to-face time during this encounter.  Delsa Grana, PA-C 07/07/19 11:52 AM

## 2019-07-07 NOTE — Patient Instructions (Addendum)
Feel you should go to Urgent Care for in person evaluation today - Both your headache symptom and blood pressure are concerning enough that I recommend you go get checked out.  I understand you want to wait and have the nurse recheck your blood pressure  I still recommend you go to urgent care if your blood pressure remains over 160/100 or you continue to have severe headache  Go to the ER with the following also see sections below with sections in bold with things you should go to the ER with or call 911  confusion, focal weakness, numbness, visual disturbances, near syncope, facial droop or slurred speech, chest pain, shortness of breath, palpitations, sudden worsening of headache with nausea or vomiting     General Headache Without Cause A headache is pain or discomfort that is felt around the head or neck area. There are many causes and types of headaches. In some cases, the cause may not be found. Follow these instructions at home: Watch your condition for any changes. Let your doctor know about them. Take these steps to help with your condition: Managing pain      Take over-the-counter and prescription medicines only as told by your doctor.  Lie down in a dark, quiet room when you have a headache.  If told, put ice on your head and neck area: ? Put ice in a plastic bag. ? Place a towel between your skin and the bag. ? Leave the ice on for 20 minutes, 2-3 times per day.  If told, put heat on the affected area. Use the heat source that your doctor recommends, such as a moist heat pack or a heating pad. ? Place a towel between your skin and the heat source. ? Leave the heat on for 20-30 minutes. ? Remove the heat if your skin turns bright red. This is very important if you are unable to feel pain, heat, or cold. You may have a greater risk of getting burned.  Keep lights dim if bright lights bother you or make your headaches worse. Eating and drinking  Eat meals on a regular  schedule.  If you drink alcohol: ? Limit how much you use to:  0-1 drink a day for women.  0-2 drinks a day for men. ? Be aware of how much alcohol is in your drink. In the [place] one drink equals one 12 oz bottle of beer (355 mL), one 5 oz glass of wine (148 mL), or one 1 oz glass of hard liquor (44 mL).  Stop drinking caffeine, or reduce how much caffeine you drink. General instructions   Keep a journal to find out if certain things bring on headaches. For example, write down: ? What you eat and drink. ? How much sleep you get. ? Any change to your diet or medicines.  Get a massage or try other ways to relax.  Limit stress.  Sit up straight. Do not tighten (tense) your muscles.  Do not use any products that contain nicotine or tobacco. This includes cigarettes, e-cigarettes, and chewing tobacco. If you need help quitting, ask your doctor.  Exercise regularly as told by your doctor.  Get enough sleep. This often means 7-9 hours of sleep each night.  Keep all follow-up visits as told by your doctor. This is important. Contact a doctor if:  Your symptoms are not helped by medicine.  You have a headache that feels different than the other headaches.  You feel sick to your stomach (nauseous)  or you throw up (vomit).  You have a fever. Get help right away if:  Your headache gets very bad quickly.  Your headache gets worse after a lot of physical activity.  You keep throwing up.  You have a stiff neck.  You have trouble seeing.  You have trouble speaking.  You have pain in the eye or ear.  Your muscles are weak or you lose muscle control.  You lose your balance or have trouble walking.  You feel like you will pass out (faint) or you pass out.  You are mixed up (confused).  You have a seizure. Summary  A headache is pain or discomfort that is felt around the head or neck area.  There are many causes and types of headaches. In some cases, the cause may  not be found.  Keep a journal to help find out what causes your headaches. Watch your condition for any changes. Let your doctor know about them.  Contact a doctor if you have a headache that is different from usual, or if your headache is not helped by medicine.  Get help right away if your headache gets very bad, you throw up, you have trouble seeing, you lose your balance, or you have a seizure. This information is not intended to replace advice given to you by your health care provider. Make sure you discuss any questions you have with your health care provider. Document Revised: 12/10/2017 Document Reviewed: 12/10/2017 Elsevier Patient Education  2020 [employer].   What is a high blood pressure emergency?-A high blood pressure emergency is a serious - and even life-threatening - condition that can happen when a person's blood pressure gets much higher than normal. When a person's blood pressure gets very high, it can lead to problems in one or more of the following organs: ?Eyes - Problems can include bleeding in the back of the eye, or swelling of the nerve that runs from the eye to the brain. ?Brain - Problems can include swelling or bleeding in the brain, or a stroke. A stroke is when a part of the brain is damaged because of a problem with blood flow. ?Kidneys - Very high blood pressure can lead to kidney failure, which is when the kidneys stop working. ?Heart - Heart problems can include a heart attack, heart failure, or damage to a major blood vessel. When your doctor or nurse tells you your blood pressure, they say 2 numbers. For example, your doctor might say that your blood pressure is "140 over 90." When people have a high blood pressure emergency, their blood pressure is usually "180 over 120" or higher. Other terms doctors might use for a high blood pressure emergency are "hypertensive emergency" or "malignant hypertension." Sometimes, a person's blood pressure is much higher  than normal, but it hasn't damaged any organs. Doctors call this "hypertensive urgency." Hypertensive urgency is not usually treated the same as a high blood pressure emergency.  What are the symptoms of a high blood pressure emergency?-The symptoms depend on the organ or organs affected. They can include: ?Blurry vision or other vision changes ?Headache ?Nausea or vomiting ?Confusion ?Passing out or seizures - Seizures are waves of abnormal electrical activity in the brain that can make people move or behave strangely. ?Weakness or numbness on one side of the body, or in one arm or leg ?Difficulty talking ?Trouble breathing ?Chest pain ?Pain in the upper back or between the shoulders  ?Urine that is brown or bloody ?Pain  in the lower back or on the side of the body   Should I see a doctor or nurse?-Yes. Call your doctor or nurse right away if you have any of the symptoms listed above, especially if you know that you have high blood pressure. Will I need tests?-Yes. Your doctor or nurse will ask about your symptoms, do an exam, and check your blood pressure. They might use a special light to look in the back of your eyes. Your doctor will also do tests to check how serious your condition is. Tests can include: ?Blood tests ?Urine tests ?A chest X-ray ?A CT scan or other imaging test of your brain - Imaging tests create pictures of the inside of the body. ?A CT scan or other imaging test of your chest ?An ECG (also called an "electrocardiogram") - This test measures the electrical activity in your heart (figure 1). How is a high blood pressure emergency treated?-A high blood pressure emergency is treated in the hospital. Your doctor will give you medicines to lower your blood pressure quickly. These medicines are usually given through a thin tube that goes into your vein, called an "IV." Your doctor will also treat any problems caused by your very high blood pressure, if they can be  treated. People who have a high blood pressure emergency usually need long-term treatment to keep their blood pressure under control. This usually includes: ?Taking medicines ?Following a low-salt diet that includes a lot of fruits and vegetables ?Losing weight (if you are overweight) ?Getting regular exercise

## 2019-07-08 ENCOUNTER — Ambulatory Visit
Admission: EM | Admit: 2019-07-08 | Discharge: 2019-07-08 | Disposition: A | Discharge status: Home / Self Care | Payer: Medicare Other | Attending: Emergency Medicine | Admitting: Emergency Medicine

## 2019-07-08 ENCOUNTER — Other Ambulatory Visit: Payer: Self-pay

## 2019-07-08 DIAGNOSIS — I10 Essential (primary) hypertension: Secondary | ICD-10-CM

## 2019-07-08 DIAGNOSIS — R519 Headache, unspecified: Secondary | ICD-10-CM | POA: Diagnosis not present

## 2019-07-08 MED ORDER — KETOROLAC TROMETHAMINE 30 MG/ML IJ SOLN: 30.0000 mg | Freq: Once | INTRAMUSCULAR | Status: DC

## 2019-07-08 MED ORDER — AMLODIPINE BESYLATE 10 MG PO TABS: 10.0000 mg | ORAL_TABLET | Freq: Every day | ORAL | 0 refills | Status: DC

## 2019-07-08 NOTE — ED Provider Notes (Signed)
[name]    CSN: [id number] Arrival date & time: 07/08/19  1455      History   Chief Complaint Chief Complaint  Patient presents with  . Headache/HBP    HPI Patricia Richardson is a 69 y.o. female.   Patient presents with headache x2 weeks.  She states it waxes and wanes but never completely resolves; range 3/10 -9/10; currently 8/10; she denies associated symptoms.  She attempted treatment at home with ibuprofen, antihistamines, and Alka Seltzer Severe Sinus with minimal relief.  She denies focal weakness, dizziness, slurred speech, facial asymmetry, fever, chills, congestion, sore throat, chest pain, cough, shortness of breath, vomiting, diarrhea, rash, or other symptoms.  Patient reports her blood pressure has been elevated at home; highest 180/105.  She had a video visit with her PCP yesterday and was started on amlodipine; patient has not started this prescription yet.    The history is provided by the patient.    Past Medical History:  Diagnosis Date  . 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 Arthritis   . Cystocele   . Depression   . Essential hypertension, benign 06/17/2018  . GERD (gastroesophageal reflux disease)   . Glaucoma   . History of chicken pox   . Hypertension   . 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 Gastroesophageal reflux disease without esophagitis 04/01/2019  . Multiple thyroid nodules 04/01/2019  . 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 Current use of proton pump inhibitor 09/23/2018  . Prediabetes 06/20/2018  . At risk for heart disease 06/20/2018  . Hx of completed stroke 06/17/2018  . Obesity (BMI 35.0-39.9 without comorbidity) 06/17/2018  . 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 Primary osteoarthritis of right hip 03/12/2017  . Dyspnea on exertion 03/01/2015  . Chronic insomnia 09/28/2014  . Anxiety 07/29/2014  . Bleeding hemorrhoids 07/29/2014    Past Surgical History:  Procedure  Laterality Date  . ABDOMINAL HYSTERECTOMY    . BREAST BIOPSY Right 11/03/2005   core biopsy w/ clip placement  . CHOLECYSTECTOMY    . COLONOSCOPY    . COLONOSCOPY WITH PROPOFOL N/A 11/05/2015   Procedure: COLONOSCOPY WITH PROPOFOL;  Surgeon: Lollie Sails, MD;  Location: [facility] ENDOSCOPY;  Service: Endoscopy;  Laterality: N/A;  . KNEE ARTHROSCOPY Right 02/21/08  . TOTAL HIP ARTHROPLASTY Right 04/09/2018   Procedure: TOTAL HIP ARTHROPLASTY ANTERIOR APPROACH;  Surgeon: Hessie Knows, MD;  Location: [facility] ORS;  Service: Orthopedics;  Laterality: Right;  . WISDOM TOOTH EXTRACTION      OB History   No obstetric history on file.      Home Medications    Prior to Admission medications   Medication Sig Start Date End Date Taking? Authorizing Provider  acetaminophen (TYLENOL) 500 MG tablet Take 1-2 tablets (500-1,000 mg total) by mouth every 6 (six) hours as needed for mild pain (pain score 1-3 or temp > 100.5). 04/11/18   Duanne Guess, PA-C  amLODipine (NORVASC) 10 MG tablet Take 1 tablet (10 mg total) by mouth daily. 07/08/19   Delsa Grana, PA-C  aspirin EC 81 MG tablet Take 1 tablet (81 mg total) by mouth daily. 04/18/19   Hubbard Hartshorn, FNP  atorvastatin (LIPITOR) 10 MG tablet TAKE 1 TABLET BY MOUTH AT BEDTIME 2 NIGHTS PER WEEK 06/05/19   Hubbard Hartshorn, FNP  latanoprost (XALATAN) 0.005 % ophthalmic solution Place 1 drop into both eyes at bedtime.  [provider]  omeprazole (PRILOSEC) 20 MG capsule Take 1 capsule (20 mg total) by mouth every other day. 04/18/19   Hubbard Hartshorn, FNP  OVER THE COUNTER MEDICATION Take 1 tablet by mouth daily as needed (constipation). Swiss Kriss herbal laxative    [provider]    Family History Family History  Problem Relation Age of Onset  . Breast cancer Maternal Aunt        great  . Kidney disease Mother   . Diabetes Mother   . Hypertension Father   . Kidney disease Father   . Diabetes Brother   . Diabetes Paternal  Grandmother     Social History Social History   Tobacco Use  . Smoking status: Never Smoker  . Smokeless tobacco: Never Used  Substance Use Topics  . Alcohol use: Yes    Comment: rarely  . Drug use: No     Allergies   Codeine   Review of Systems Review of Systems  Constitutional: Negative for chills and fever.  HENT: Negative for congestion, ear pain, rhinorrhea and sore throat.   Eyes: Negative for pain and visual disturbance.  Respiratory: Negative for cough and shortness of breath.   Cardiovascular: Negative for chest pain and palpitations.  Gastrointestinal: Negative for abdominal pain, diarrhea, nausea and vomiting.  Genitourinary: Negative for dysuria and hematuria.  Musculoskeletal: Negative for arthralgias and back pain.  Skin: Negative for color change and rash.  Neurological: Positive for headaches. Negative for dizziness, seizures, syncope, facial asymmetry, speech difficulty, weakness and numbness.  All other systems reviewed and are negative.    Physical Exam Triage Vital Signs ED Triage Vitals  Enc Vitals Group     BP --      Pulse --      Resp --      Temp --      Temp src --      SpO2 --      Weight 07/08/19 1502 198 lb (89.8 kg)     Height --      Head Circumference --      Peak Flow --      Pain Score 07/08/19 1501 8     Pain Loc --      Pain Edu? --      Excl. in [place]? --    No data found.  Updated Vital Signs BP (!) 151/85 (BP Location: Left Arm)   Pulse 92   Temp 97.8 F (36.6 C) (Oral)   Resp 18   Wt 198 lb (89.8 kg)   SpO2 98%   BMI 36.21 kg/m   Visual Acuity Right Eye Distance:   Left Eye Distance:   Bilateral Distance:    Right Eye Near:   Left Eye Near:    Bilateral Near:     Physical Exam Vitals and nursing note reviewed.  Constitutional:      General: She is not in acute distress.    Appearance: She is well-developed. She is not ill-appearing or diaphoretic.  HENT:     Head: Normocephalic and atraumatic.      Right Ear: Tympanic membrane normal.     Left Ear: Tympanic membrane normal.     Nose: Nose normal.     Mouth/Throat:     Mouth: Mucous membranes are moist.     Pharynx: Oropharynx is clear.  Eyes:     Extraocular Movements: Extraocular movements intact.     Conjunctiva/sclera: Conjunctivae normal.     Pupils: Pupils are  equal, round, and reactive to light.  Cardiovascular:     Rate and Rhythm: Normal rate and regular rhythm.     Heart sounds: Normal heart sounds. No murmur.  Pulmonary:     Effort: Pulmonary effort is normal. No respiratory distress.     Breath sounds: Normal breath sounds. No wheezing or rhonchi.  Abdominal:     General: Bowel sounds are normal.     Palpations: Abdomen is soft.     Tenderness: There is no abdominal tenderness. There is no guarding or rebound.  Musculoskeletal:        General: Normal range of motion.     Cervical back: Neck supple.     Right lower leg: No edema.     Left lower leg: No edema.  Skin:    General: Skin is warm and dry.     Findings: No rash.  Neurological:     General: No focal deficit present.     Mental Status: She is alert and oriented to person, place, and time.     Cranial Nerves: No cranial nerve deficit.     Sensory: No sensory deficit.     Motor: No weakness.     Coordination: Coordination normal.     Gait: Gait normal.     Deep Tendon Reflexes: Reflexes normal.  Psychiatric:        Mood and Affect: Mood normal.        Behavior: Behavior normal.      UC Treatments / Results  Labs (all labs ordered are listed, but only abnormal results are displayed) Labs Reviewed  NOVEL CORONAVIRUS, NAA    EKG   Radiology No results found.  Procedures Procedures (including critical care time)  Medications Ordered in UC Medications  ketorolac (TORADOL) 30 MG/ML injection 30 mg (has no administration in time range)    Initial Impression / Assessment and Plan / UC Course  I have reviewed the triage vital signs and  the nursing notes.  Pertinent labs & imaging results that were available during my care of the patient were reviewed by me and considered in my medical decision making (see chart for details).   Acute non-intractable headache.  Elevated blood pressure with known hypertension.  Patient reports her headache decreased to 4/10 after Toradol injection.  Instructed patient to start taking the amlodipine prescribed by her PCP and to keep a log of her blood pressure readings at home.  Instructed her to follow-up with her PCP in the next 2-3 days to have her blood pressure and headache rechecked.  Discussed signs and symptoms of cardiovascular events such as CVA or MI with patient at length; patient instructed to call 911 if she has any symptoms.  Also instructed patient to go to the ED if she has acute worsening headache or high blood pressure or other concerning symptoms.  COVID test performed here; Discussed with patient that we will call her if the test result is positive and that she should self quarantine until the test result is back.  Patient agrees to plan of care.     Final Clinical Impressions(s) / UC Diagnoses   Final diagnoses:  Acute nonintractable headache, unspecified headache type  Elevated blood pressure reading in office with diagnosis of hypertension     Discharge Instructions     You were given an injection of Toradol today for your headache.   Start taking your blood pressure medication as directed by your primary care provider.  Keep a log of your  blood pressure readings at home.   Your blood pressure is elevated today at 151/85.  Please have this rechecked by your primary care provider in 2-3 days.      Go the emergency department if you have acute worsening symptoms, including worsening headache or high blood pressure.  Go to the emergency department if you have symptoms of a cardiovascular event such as a stroke or heart attack (weakness, facial droop, slurred speech, confusion,  chest pain, shortness of breath).  Your COVID test is pending.  We will call you if the test result is positive.  You can track your results on your MyChart.        ED Prescriptions    None     PDMP not reviewed this encounter.   Sharion Balloon, NP 07/08/19 [phone]

## 2019-07-08 NOTE — Discharge Instructions (Addendum)
You were given an injection of Toradol today for your headache.   Start taking your blood pressure medication as directed by your primary care provider.  Keep a log of your blood pressure readings at home.   Your blood pressure is elevated today at 151/85.  Please have this rechecked by your primary care provider in 2-3 days.      Go the emergency department if you have acute worsening symptoms, including worsening headache or high blood pressure.  Go to the emergency department if you have symptoms of a cardiovascular event such as a stroke or heart attack (weakness, facial droop, slurred speech, confusion, chest pain, shortness of breath).  Your COVID test is pending.  We will call you if the test result is positive.  You can track your results on your MyChart.

## 2019-07-08 NOTE — ED Triage Notes (Signed)
Pt is here with a severe headache and high bp symptoms with blurry vision sometimes when the headache is bad enough.

## 2019-07-09 LAB — NOVEL CORONAVIRUS, NAA: SARS-CoV-2, NAA: NOT DETECTED

## 2019-07-10 ENCOUNTER — Encounter (INDEPENDENT_AMBULATORY_CARE_PROVIDER_SITE_OTHER): Payer: Self-pay | Admitting: Vascular Surgery

## 2019-07-10 ENCOUNTER — Other Ambulatory Visit: Payer: Self-pay

## 2019-07-10 ENCOUNTER — Ambulatory Visit (INDEPENDENT_AMBULATORY_CARE_PROVIDER_SITE_OTHER): Payer: Medicare Other

## 2019-07-10 ENCOUNTER — Other Ambulatory Visit (INDEPENDENT_AMBULATORY_CARE_PROVIDER_SITE_OTHER): Payer: Self-pay | Admitting: Vascular Surgery

## 2019-07-10 ENCOUNTER — Ambulatory Visit (INDEPENDENT_AMBULATORY_CARE_PROVIDER_SITE_OTHER): Payer: Medicare Other | Admitting: Vascular Surgery

## 2019-07-10 VITALS — BP 136/82 | HR 80 | Resp 16 | Ht 62.0 in | Wt 194.0 lb

## 2019-07-10 DIAGNOSIS — M79605 Pain in left leg: Secondary | ICD-10-CM | POA: Diagnosis not present

## 2019-07-10 DIAGNOSIS — I739 Peripheral vascular disease, unspecified: Secondary | ICD-10-CM

## 2019-07-10 DIAGNOSIS — M159 Polyosteoarthritis, unspecified: Secondary | ICD-10-CM

## 2019-07-10 DIAGNOSIS — M8949 Other hypertrophic osteoarthropathy, multiple sites: Secondary | ICD-10-CM

## 2019-07-10 DIAGNOSIS — M79604 Pain in right leg: Secondary | ICD-10-CM | POA: Diagnosis not present

## 2019-07-10 DIAGNOSIS — K219 Gastro-esophageal reflux disease without esophagitis: Secondary | ICD-10-CM

## 2019-07-15 ENCOUNTER — Encounter (INDEPENDENT_AMBULATORY_CARE_PROVIDER_SITE_OTHER): Payer: Self-pay | Admitting: Vascular Surgery

## 2019-07-15 DIAGNOSIS — M1990 Unspecified osteoarthritis, unspecified site: Secondary | ICD-10-CM | POA: Insufficient documentation

## 2019-07-15 DIAGNOSIS — M79606 Pain in leg, unspecified: Secondary | ICD-10-CM | POA: Insufficient documentation

## 2019-07-15 NOTE — Progress Notes (Signed)
MRN : [id number]  Patricia Richardson is a 69 y.o. (08-24-1950) female who presents with chief complaint of  Chief Complaint  Patient presents with  . New Patient (Initial Visit)    ref Uval[facility] Rising for abi  .  History of Present Illness:   The patient is seen for evaluation of painful lower extremities. Patient notes the pain is variable and not always associated with activity.  The pain is somewhat consistent day to day occurring on most days. The patient notes the pain also occurs with standing and routinely seems worse as the day wears on. The pain has been progressive over the past several years. The patient states these symptoms are causing  a profound negative impact on quality of life and daily activities.  The patient denies rest pain or dangling of an extremity off the side of the bed during the night for relief. No open wounds or sores at this time. No history of DVT or phlebitis. No prior interventions or surgeries.  There is a  history of back problems and DJD of the lumbar and sacral spine.     Current Meds  Medication Sig  . acetaminophen (TYLENOL) 500 MG tablet Take 1-2 tablets (500-1,000 mg total) by mouth every 6 (six) hours as needed for mild pain (pain score 1-3 or temp > 100.5).  Marland Kitchen amLODipine (NORVASC) 10 MG tablet Take 1 tablet (10 mg total) by mouth daily.  Marland Kitchen aspirin EC 81 MG tablet Take 1 tablet (81 mg total) by mouth daily.  Marland Kitchen atorvastatin (LIPITOR) 10 MG tablet TAKE 1 TABLET BY MOUTH AT BEDTIME 2 NIGHTS PER WEEK  . latanoprost (XALATAN) 0.005 % ophthalmic solution Place 1 drop into both eyes at bedtime.   Marland Kitchen omeprazole (PRILOSEC) 20 MG capsule Take 1 capsule (20 mg total) by mouth every other day.  Marland Kitchen OVER THE COUNTER MEDICATION Take 1 tablet by mouth daily as needed (constipation). Swiss Kriss herbal laxative    Past Medical History:  Diagnosis Date  . 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 Arthritis   . Cystocele   . Depression   . Essential hypertension, benign  06/17/2018  . GERD (gastroesophageal reflux disease)   . Glaucoma   . History of chicken pox   . Hypertension   . 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 ABDOMINAL HYSTERECTOMY    . BREAST BIOPSY Right 11/03/2005   core biopsy w/ clip placement  . CHOLECYSTECTOMY    . COLONOSCOPY    . COLONOSCOPY WITH PROPOFOL N/A 11/05/2015   Procedure: COLONOSCOPY WITH PROPOFOL;  Surgeon: Lollie Sails, MD;  Location: [facility] ENDOSCOPY;  Service: Endoscopy;  Laterality: N/A;  . KNEE ARTHROSCOPY Right 02/21/08  . TOTAL HIP ARTHROPLASTY Right 04/09/2018   Procedure: TOTAL HIP ARTHROPLASTY ANTERIOR APPROACH;  Surgeon: Hessie Knows, MD;  Location: [facility] ORS;  Service: Orthopedics;  Laterality: Right;  . WISDOM TOOTH EXTRACTION      Social History Social History   Tobacco Use  . Smoking status: Never Smoker  . Smokeless tobacco: Never Used  Substance Use Topics  . Alcohol use: Yes    Comment: rarely  . Drug use: No    Family History Family History  Problem Relation Age of Onset  . Breast cancer Maternal Aunt        great  . Kidney disease Mother   . Diabetes Mother   . Hypertension Father   . Kidney disease Father   .  Diabetes Brother   . Diabetes Paternal Grandmother   No family history of bleeding/clotting disorders, porphyria or autoimmune disease   Allergies  Allergen Reactions  . Codeine Itching    Pin and needles      REVIEW OF SYSTEMS (Negative unless checked)  Constitutional: [] Weight loss  [] Fever  [] Chills Cardiac: [] Chest pain   [] Chest pressure   [] Palpitations   [] Shortness of breath when laying flat   [] Shortness of breath with exertion. Vascular:  [x] Pain in legs with walking   [x] Pain in legs at rest  [] History of DVT   [] Phlebitis   [] Swelling in legs   [] Varicose veins   [] Non-healing ulcers Pulmonary:   [] Uses home oxygen   [] Productive cough   [] Hemoptysis   [] Wheeze  [] COPD   [] Asthma Neurologic:  [] Dizziness    [] Seizures   [] History of stroke   [] History of TIA  [] Aphasia   [] Vissual changes   [] Weakness or numbness in arm   [] Weakness or numbness in leg Musculoskeletal:   [] Joint swelling   [] Joint pain   [] Low back pain Hematologic:  [] Easy bruising  [] Easy bleeding   [] Hypercoagulable state   [] Anemic Gastrointestinal:  [] Diarrhea   [] Vomiting  [x] Gastroesophageal reflux/heartburn   [] Difficulty swallowing. Genitourinary:  [] Chronic kidney disease   [] Difficult urination  [] Frequent urination   [] Blood in urine Skin:  [] Rashes   [] Ulcers  Psychological:  [] History of anxiety   []  History of major depression.  Physical Examination  Vitals:   07/10/19 1458  BP: 136/82  Pulse: 80  Resp: 16  Weight: 194 lb (88 kg)  Height: 5\' 2"  (1.575 m)   Body mass index is 35.48 kg/m. Gen: WD/WN, NAD Head: /AT, No temporalis wasting.  Ear/Nose/Throat: Hearing grossly intact, nares w/o erythema or drainage, poor dentition Eyes: PER, EOMI, sclera nonicteric.  Neck: Supple, no masses.  No bruit or JVD.  Pulmonary:  Good air movement, clear to auscultation bilaterally, no use of accessory muscles.  Cardiac: RRR, normal S1, S2, no Murmurs. Vascular: 1+ edema Vessel Right Left  Radial Palpable Palpable  PT Palpable Palpable  DP Palpable Palpable  Gastrointestinal: soft, non-distended. No guarding/no peritoneal signs.  Musculoskeletal: M/S 5/5 throughout.  No deformity or atrophy.  Neurologic: CN 2-12 intact. Pain and light touch intact in extremities.  Symmetrical.  Speech is fluent. Motor exam as listed above. Psychiatric: Judgment intact, Mood & affect appropriate for pt's clinical situation. Dermatologic: No rashes or ulcers noted.  No changes consistent with cellulitis. Lymph : No Cervical lymphadenopathy, no lichenification or skin changes of chronic lymphedema.  CBC Lab Results  Component Value Date   WBC 9.1 06/17/2018   HGB 14.2 06/17/2018   HCT 42.4 06/17/2018   MCV 87.8 06/17/2018    PLT 397 06/17/2018    BMET    Component Value Date/Time   NA 142 04/10/2019 0000   K 4.5 04/10/2019 0000   CL 108 04/10/2019 0000   CO2 20 04/10/2019 0000   GLUCOSE 85 04/10/2019 0000   BUN 13 04/10/2019 0000   CREATININE 0.75 04/10/2019 0000   CALCIUM 9.8 04/10/2019 0000   GFRNONAA 81 04/10/2019 0000   GFRAA 94 04/10/2019 0000   CrCl cannot be calculated (Patient's most recent lab result is older than the maximum 21 days allowed.).  COAG Lab Results  Component Value Date   INR 0.93 04/09/2018    Radiology VAS [place] ABI WITH/WO TBI  Result Date: 07/10/2019 LOWER EXTREMITY DOPPLER STUDY Indications: Claudication, and peripheral artery disease. High  Risk Factors: Hyperlipidemia.  Performing Technologist: Almira Coaster RVS  Examination Guidelines: A complete evaluation includes at minimum, Doppler waveform signals and systolic blood pressure reading at the level of bilateral brachial, anterior tibial, and posterior tibial arteries, when vessel segments are accessible. Bilateral testing is considered an integral part of a complete examination. Photoelectric Plethysmograph (PPG) waveforms and toe systolic pressure readings are included as required and additional duplex testing as needed. Limited examinations for reoccurring indications may be performed as noted.  ABI Findings: +---------+------------------+-----+---------+--------+ Right    Rt Pressure (mmHg)IndexWaveform Comment  +---------+------------------+-----+---------+--------+ Brachial 148                                      +---------+------------------+-----+---------+--------+ ATA      140               0.95 triphasic         +---------+------------------+-----+---------+--------+ PTA      170               1.15 triphasic         +---------+------------------+-----+---------+--------+ Great Toe138               0.93 Normal            +---------+------------------+-----+---------+--------+  +---------+------------------+-----+---------+-------+ Left     Lt Pressure (mmHg)IndexWaveform Comment +---------+------------------+-----+---------+-------+ Brachial 144                                     +---------+------------------+-----+---------+-------+ ATA      128               0.86 biphasic         +---------+------------------+-----+---------+-------+ PTA      165               1.11 triphasic        +---------+------------------+-----+---------+-------+ Great Toe127               0.86 Normal           +---------+------------------+-----+---------+-------+ +-------+-----------+-----------+------------+------------+ ABI/TBIToday's ABIToday's TBIPrevious ABIPrevious TBI +-------+-----------+-----------+------------+------------+ Right  1.15       .93                                 +-------+-----------+-----------+------------+------------+ Left   1.11       .86                                 +-------+-----------+-----------+------------+------------+  Summary: Right: Resting right ankle-brachial index is within normal range. No evidence of significant right lower extremity arterial disease. The right toe-brachial index is normal. Left: Resting left ankle-brachial index is within normal range. No evidence of significant left lower extremity arterial disease. The left toe-brachial index is normal.  *See table(s) above for measurements and observations.  Electronically signed by Hortencia Pilar MD on 07/10/2019 at 4:13:06 PM.    Final       Assessment/Plan 1. Pain in both lower extremities  Recommend:  The patient has atypical pain symptoms for pure atherosclerotic disease. However, on physical exam there is evidence of possible arterial disease.  Noninvasive studies aorta iliac ultrasound of the legs will be obtained and the patient will follow up with me to  review these studies.  I suspect the patient is c/o pseudoclaudication.  Patient should  have an evaluation of his LS spine which I defer to the primary service.  The patient should continue walking and begin a more formal exercise program. The patient should continue his antiplatelet therapy and aggressive treatment of the lipid abnormalities.  The patient should begin wearing graduated compression socks 15-20 mmHg strength to control edema.   2. Primary osteoarthritis involving multiple joints See #1  3. Gastroesophageal reflux disease without esophagitis Continue PPI as already ordered, this medication has been reviewed and there are no changes at this time.  Avoidence of caffeine and alcohol  Moderate elevation of the head of the bed     Hortencia Pilar, MD  07/15/2019 10:46 AM

## 2019-07-21 ENCOUNTER — Ambulatory Visit (INDEPENDENT_AMBULATORY_CARE_PROVIDER_SITE_OTHER): Payer: Medicare Other

## 2019-07-21 ENCOUNTER — Ambulatory Visit (INDEPENDENT_AMBULATORY_CARE_PROVIDER_SITE_OTHER): Payer: Medicare Other | Admitting: Nurse Practitioner

## 2019-07-21 ENCOUNTER — Encounter (INDEPENDENT_AMBULATORY_CARE_PROVIDER_SITE_OTHER): Payer: Self-pay | Admitting: Nurse Practitioner

## 2019-07-21 ENCOUNTER — Other Ambulatory Visit: Payer: Self-pay

## 2019-07-21 VITALS — BP 142/84 | HR 90 | Resp 16 | Wt 193.0 lb

## 2019-07-21 DIAGNOSIS — M79605 Pain in left leg: Secondary | ICD-10-CM

## 2019-07-21 DIAGNOSIS — M8949 Other hypertrophic osteoarthropathy, multiple sites: Secondary | ICD-10-CM

## 2019-07-21 DIAGNOSIS — M159 Polyosteoarthritis, unspecified: Secondary | ICD-10-CM

## 2019-07-21 DIAGNOSIS — M79604 Pain in right leg: Secondary | ICD-10-CM

## 2019-07-21 NOTE — Progress Notes (Signed)
SUBJECTIVE:  Patient ID: Patricia Richardson, female    DOB: 10-Nov-1950, 69 y.o.   MRN: [id number] Chief Complaint  Patient presents with  . Follow-up    ultrasound follow up    HPI  Patricia Richardson is a 69 y.o. female that presents today for follow-up noninvasive studies related to her lower extremity pain.  The patient does endorse having history of lower back pain that aches when she walks and she also describes a tightness that radiates in her lower back area.  Generally in conjunction with this the patient's legs hurt and she also has issues with her legs giving out when she has walked a short distance.  She denies any lower extremity wounds or rest pain like symptoms.  She denies any fever, chills, nausea, vomiting or diarrhea.  She denies any chest pain or shortness of breath.  Today the patient shows no evidence of an abdominal aortic aneurysm.  Largest aortic measurement was 2.0 cm.  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  The right common iliac artery and left external iliac artery have velocities slightly above 200.  Otherwise the patient has biphasic/triphasic waveforms in her iliac arteries.  Past Medical History:  Diagnosis Date  . 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 Arthritis   . Cystocele   . Depression   . Essential hypertension, benign 06/17/2018  . GERD (gastroesophageal reflux disease)   . Glaucoma   . History of chicken pox   . Hypertension   . 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 ABDOMINAL HYSTERECTOMY    . BREAST BIOPSY Right 11/03/2005   core biopsy w/ clip placement  . CHOLECYSTECTOMY    . COLONOSCOPY    . COLONOSCOPY WITH PROPOFOL N/A 11/05/2015   Procedure: COLONOSCOPY WITH PROPOFOL;  Surgeon: Lollie Sails, MD;  Location: [facility] ENDOSCOPY;  Service: Endoscopy;  Laterality: N/A;  . KNEE ARTHROSCOPY Right 02/21/08  . TOTAL HIP ARTHROPLASTY Right  04/09/2018   Procedure: TOTAL HIP ARTHROPLASTY ANTERIOR APPROACH;  Surgeon: Hessie Knows, MD;  Location: [facility] ORS;  Service: Orthopedics;  Laterality: Right;  . WISDOM TOOTH EXTRACTION      Social History   Socioeconomic History  . Marital status: Divorced    Spouse name: Not on file  . Number of children: 2  . Years of education: 58  . Highest education level: Not on file  Occupational History  . Not on file  Tobacco Use  . Smoking status: Never Smoker  . Smokeless tobacco: Never Used  Substance and Sexual Activity  . Alcohol use: Yes    Comment: rarely  . Drug use: No  . Sexual activity: Not Currently  Other Topics Concern  . Not on file  Social History Narrative  . Not on file   Social Determinants of Health   Financial Resource Strain:   . Difficulty of Paying Living Expenses: Not on file  Food Insecurity:   . Worried About Charity fundraiser in the Last Year: Not on file  . Ran Out of Food in the Last Year: Not on file  Transportation Needs:   . Lack of Transportation (Medical): Not on file  . Lack of Transportation (Non-Medical): Not on file  Physical Activity:   . Days of Exercise per Week: Not on file  . Minutes of Exercise per Session: Not on file  Stress:   .  Feeling of Stress : Not on file  Social Connections:   . Frequency of Communication with Friends and Family: Not on file  . Frequency of Social Gatherings with Friends and Family: Not on file  . Attends Religious Services: Not on file  . Active Member of Clubs or Organizations: Not on file  . Attends Archivist Meetings: Not on file  . Marital Status: Not on file  Intimate Partner Violence:   . Fear of Current or Ex-Partner: Not on file  . Emotionally Abused: Not on file  . Physically Abused: Not on file  . Sexually Abused: Not on file    Family History  Problem Relation Age of Onset  . Breast cancer Maternal Aunt        great  . Kidney disease Mother   . Diabetes Mother   .  Hypertension Father   . Kidney disease Father   . Diabetes Brother   . Diabetes Paternal Grandmother     Allergies  Allergen Reactions  . Codeine Itching    Pin and needles      Review of Systems   Review of Systems: Negative Unless Checked Constitutional: [] Weight loss  [] Fever  [] Chills Cardiac: [] Chest pain   []  Atrial Fibrillation  [] Palpitations   [] Shortness of breath when laying flat   [] Shortness of breath with exertion. [] Shortness of breath at rest Vascular:  [] Pain in legs with walking   [] Pain in legs with standing [] Pain in legs when laying flat   [x] Claudication    [] Pain in feet when laying flat    [] History of DVT   [] Phlebitis   [] Swelling in legs   [] Varicose veins   [] Non-healing ulcers Pulmonary:   [] Uses home oxygen   [] Productive cough   [] Hemoptysis   [] Wheeze  [] COPD   [] Asthma Neurologic:  [] Dizziness   [] Seizures  [] Blackouts [] History of stroke   [] History of TIA  [] Aphasia   [] Temporary Blindness   [] Weakness or numbness in arm   [] Weakness or numbness in leg Musculoskeletal:   [] Joint swelling   [] Joint pain   [x] Low back pain  []  History of Knee Replacement [] Arthritis [] back Surgeries  []  Spinal Stenosis    Hematologic:  [] Easy bruising  [] Easy bleeding   [] Hypercoagulable state   [] Anemic Gastrointestinal:  [] Diarrhea   [] Vomiting  [x] Gastroesophageal reflux/heartburn   [] Difficulty swallowing. [] Abdominal pain Genitourinary:  [] Chronic kidney disease   [] Difficult urination  [] Anuric   [] Blood in urine [] Frequent urination  [] Burning with urination   [] Hematuria Skin:  [] Rashes   [] Ulcers [] Wounds Psychological:  [] History of anxiety   []  History of major depression  []  Memory Difficulties      OBJECTIVE:   Physical Exam  BP (!) 142/84 (BP Location: Right Arm)   Pulse 90   Resp 16   Wt 193 lb (87.5 kg)   BMI 35.30 kg/m   Gen: WD/WN, NAD Head: [place]/AT, No temporalis wasting.  Ear/Nose/Throat: Hearing grossly intact, nares w/o erythema or  drainage Eyes: PER, EOMI, sclera nonicteric.  Neck: Supple, no masses.  No JVD.  Pulmonary:  Good air movement, no use of accessory muscles.  Cardiac: RRR Vascular:  Minimal edema bilaterally Vessel Right Left  Radial Palpable Palpable  Posterior Tibial Palpable Palpable   Gastrointestinal: soft, non-distended. No guarding/no peritoneal signs.  Musculoskeletal: M/S 5/5 throughout.  No deformity or atrophy.  Neurologic: Pain and light touch intact in extremities.  Symmetrical.  Speech is fluent. Motor exam as listed above. Psychiatric: Judgment intact,  Mood & affect appropriate for pt's clinical situation.        ASSESSMENT AND PLAN:  1. 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  There is no evidence of abdominal aortic aneurysm.  The largest measurement was 2.0 cm.  Typically, velocities greater than 200 in the iliac arteries do indicate at least a 50% stenosis.  The patient's right common iliac artery and her left external iliac artery both have velocities just over 200 which would indicate about a 50% stenosis.  Generally, stenosis other than 70% generally more intervention.  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  I have stressed that she see a spine specialist and offered to place a referral however at this time the patient would like to wait and discuss with her primary care provider.  Based upon what is found at the spine specialist if they find no evidence of degenerative disc disease we can perform an angiogram to fully rule out any possible causes of claudication.  2. Primary osteoarthritis involving multiple joints Have suggested with the patient that she see a spine specialist to examine possible sources of her back pain and neurogenic claudication.  At this time  the patient wishes to discuss with her primary care provider and have the referral placed back.   Current Outpatient Medications on File Prior to Visit  Medication Sig Dispense Refill  . acetaminophen (TYLENOL) 500 MG tablet Take 1-2 tablets (500-1,000 mg total) by mouth every 6 (six) hours as needed for mild pain (pain score 1-3 or temp > 100.5).    Marland Kitchen amLODipine (NORVASC) 10 MG tablet Take 1 tablet (10 mg total) by mouth daily. 90 tablet 0  . aspirin EC 81 MG tablet Take 1 tablet (81 mg total) by mouth daily.    Marland Kitchen atorvastatin (LIPITOR) 10 MG tablet TAKE 1 TABLET BY MOUTH AT BEDTIME 2 NIGHTS PER WEEK 26 tablet 3  . latanoprost (XALATAN) 0.005 % ophthalmic solution Place 1 drop into both eyes at bedtime.     Marland Kitchen omeprazole (PRILOSEC) 20 MG capsule Take 1 capsule (20 mg total) by mouth every other day. 45 capsule 3  . OVER THE COUNTER MEDICATION Take 1 tablet by mouth daily as needed (constipation). Swiss Kriss herbal laxative     No current facility-administered medications on file prior to visit.    There are no Patient Instructions on file for this visit. No follow-ups on file.   Kris Hartmann, NP  This note was completed with Sales executive.  Any errors are purely unintentional.

## 2019-07-31 ENCOUNTER — Ambulatory Visit: Payer: Medicare Other | Attending: Internal Medicine

## 2019-07-31 DIAGNOSIS — Z23 Encounter for immunization: Secondary | ICD-10-CM | POA: Insufficient documentation

## 2019-07-31 NOTE — Progress Notes (Signed)
   Covid-19 Vaccination Clinic  Name:  Patricia Richardson    MRN: [id number] DOB: [birth year]  07/31/2019  Patricia Richardson was observed post Covid-19 immunization for 15 minutes without incidence. She was provided with Vaccine Information Sheet and instruction to access the V-Safe system.   Patricia Richardson was instructed to call 911 with any severe reactions post vaccine: Marland Kitchen Difficulty breathing  . Swelling of your face and throat  . A fast heartbeat  . A bad rash all over your body  . Dizziness and weakness    Immunizations Administered    Name Date Dose VIS Date Route   Pfizer COVID-19 Vaccine 07/31/2019  9:29 AM 0.3 mL 05/16/2019 Intramuscular   Manufacturer: [place]   Lot: [id number]   [place]: [id number]

## 2019-08-26 ENCOUNTER — Ambulatory Visit: Payer: Medicare Other | Attending: Internal Medicine

## 2019-08-26 DIAGNOSIS — Z23 Encounter for immunization: Secondary | ICD-10-CM

## 2019-08-26 NOTE — Progress Notes (Signed)
   Covid-19 Vaccination Clinic  Name:  SHANYCE KORSMO    MRN: [id number] DOB: 02/23/1951  08/26/2019  Ms. Defenbaugh was observed post Covid-19 immunization for 15 minutes without incident. She was provided with Vaccine Information Sheet and instruction to access the V-Safe system.   Ms. Blauser was instructed to call 911 with any severe reactions post vaccine: Marland Kitchen Difficulty breathing  . Swelling of face and throat  . A fast heartbeat  . A bad rash all over body  . Dizziness and weakness   Immunizations Administered    Name Date Dose VIS Date Route   Pfizer COVID-19 Vaccine 08/26/2019  9:06 AM 0.3 mL 05/16/2019 Intramuscular   Manufacturer: [place]   Lot: [id number]   [place]: [id number]

## 2019-08-27 ENCOUNTER — Ambulatory Visit: Payer: Self-pay

## 2019-08-27 ENCOUNTER — Other Ambulatory Visit: Payer: Self-pay | Admitting: Family Medicine

## 2019-08-27 MED ORDER — AMLODIPINE BESYLATE 10 MG PO TABS: 10.0000 mg | ORAL_TABLET | Freq: Every day | ORAL | 0 refills | Status: DC

## 2019-08-27 MED ORDER — ASPIRIN EC 81 MG PO TBEC: 81.0000 mg | DELAYED_RELEASE_TABLET | Freq: Every day | ORAL | 0 refills | Status: DC

## 2019-08-27 NOTE — Telephone Encounter (Signed)
Appointment schedule for 3/25 with Dr.Hendrickson

## 2019-08-27 NOTE — Progress Notes (Signed)
Patient ID: Patricia Richardson, female    DOB: 10/08/1950, 69 y.o.   MRN: [id number]  PCP: Hubbard Hartshorn, FNP  Chief Complaint  Patient presents with  . Joint Swelling    both ankles, right is worse, started in February after starting amlodipine    Subjective:   Patricia Richardson is a 69 y.o. female, presents to clinic with CC of the following:  Chief Complaint  Patient presents with  . Joint Swelling    both ankles, right is worse, started in February after starting amlodipine    HPI:  Patient is a 69 year old female patient of [name] who presents with concerns with ankle swelling, in both ankles but the right is worse.  She notes it started in February after starting amlodipine for her blood pressure.  She notes the amlodipine has been very helpful in controlling her blood pressure, checked it in February, and had a list of the readings, with a few days of the top number in the 140s, otherwise very well controlled.  This morning her blood pressure was 126/79.  She notes she has had no headaches since starting the blood pressure medicine.  She denies any other concerns with the medicine, and thinks it has been very helpful. She notes her lower extremities are much more swollen at the end of the day, not swollen much in the morning, and states they are not swollen presently.  She denies any pain in the lower extremities.  No numbness or tingling or weakness in the lower extremities noted.  She notes occasionally she feels some left-sided hip discomfort, had her right hip replaced in the past, and denies any right hip problems.  He feels that the pain in her lower back buttock area, intermittently, and denies any pain down the back of the leg  She did just see vascular, as noted below.  There was a question of more a neurogenic claudication possibility then a vascular concern.  She notes she takes the blood pressure medicine at night, the statin product in the morning, and I recommended  she reverse that. BP Readings from Last 3 Encounters:  08/28/19 130/84  07/21/19 (!) 142/84  07/10/19 136/82   She denies any recent chest pains, palpitations, shortness of breath.  She was last seen by Raquel Sarna in October 2020 with the following issues addressed: HLD: She has history of stroke in 2003 without residual effects.  She is taking atorvastatin 10mg  twice weekly and asiprin 81mg  without any issues.  She is taking statin therapy to try to bring her LDL dwn below 70.   GERD:  She is doing well on omeprazole every other day. Denies difficulty swallowing, abdominal pain, NV, regurgitation, no blood in stool LEFT Hand Tremor: She has noticed a very slight tremor to the left hand that started a couple of months ago.  She notes her mother had similar tremor in the left hand. She states it is not a constant tremor.  She notes she can calm the tremor with intentional movement and/or with using her other hand to touch/calm the left hand.  She would like to wait on referral/medication at this time - we will have her monitor for any worsening; consider neuro referral if needed.  History prediabetes/Obesity: Does have history of elevated A1C of 5.8% 9 months ago, then it came down to 5.5% 4 months ago.  She have extensive family history of DM.  Denied polyuria, polydipsia, or polyphagia. Multiple Thyroid Nodules: Saw Dr.  Celine Ahr for Biopsy in June 2020 which was benighn; recommended 1 year [place] repeat for surveillance.   She was seen more recently in the emergency department on 07/08/2019 for intractable headache with high blood pressure and known hypertension noted. Her headache decreased to 4/10 after Toradol injection.  Instructed patient to start taking the amlodipine prescribed by her PCP and to keep a log of her blood pressure readings at home.   Instructed her to follow-up with her PCP in the next 2-3 days to have her blood pressure and headache rechecked (not happen).  She was seen by [facility]  vascular surgery 07/21/2019 after noninvasive studies were done related to her lower extremity pain and back pain she noted..  Their assessment was as follows: ASSESSMENT AND PLAN:  1. 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  There is no evidence of abdominal aortic aneurysm.  The largest measurement was 2.0 cm.  Typically, velocities greater than 200 in the iliac arteries do indicate at least a 50% stenosis.  The patient's right common iliac artery and her left external iliac artery both have velocities just over 200 which would indicate about a 50% stenosis.  Generally, stenosis other than 70% generally more intervention.  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  I have stressed that she see a spine specialist and offered to place a referral however at this time the patient would like to wait and discuss with her primary care provider.  Based upon what is found at the spine specialist if they find no evidence of degenerative disc disease we can perform an angiogram to fully rule out any possible causes of claudication. 2. Primary osteoarthritis involving multiple joints Have suggested with the patient that she see a spine specialist to examine possible sources of her back pain and neurogenic claudication.  At this time the patient wishes to discuss with her primary care provider and have the referral placed back    Patient Active Problem List   Diagnosis Date Noted  . Leg pain 07/15/2019  . DJD (degenerative joint disease) 07/15/2019  . Gastroesophageal reflux disease without esophagitis 04/01/2019  . Multiple thyroid nodules 04/01/2019  . 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 Current use of proton pump inhibitor 09/23/2018  . Prediabetes 06/20/2018  . At risk for heart disease 06/20/2018   . Hx of completed stroke 06/17/2018  . Obesity (BMI 35.0-39.9 without comorbidity) 06/17/2018  . 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 Primary osteoarthritis of right hip 03/12/2017  . Dyspnea on exertion 03/01/2015  . Chronic insomnia 09/28/2014  . Anxiety 07/29/2014  . Bleeding hemorrhoids 07/29/2014      Current Outpatient Medications:  .  acetaminophen (TYLENOL) 500 MG tablet, Take 1-2 tablets (500-1,000 mg total) by mouth every 6 (six) hours as needed for mild pain (pain score 1-3 or temp > 100.5)., Disp: , Rfl:  .  amLODipine (NORVASC) 10 MG tablet, Take 1 tablet (10 mg total) by mouth daily., Disp: 90 tablet, Rfl: 0 .  aspirin EC 81 MG tablet, Take 1 tablet (81 mg total) by mouth daily., Disp: 90 tablet, Rfl: 0 .  atorvastatin (LIPITOR) 10 MG tablet, TAKE 1 TABLET BY MOUTH AT BEDTIME 2 NIGHTS PER WEEK, Disp: 26 tablet, Rfl: 3 .  latanoprost (XALATAN) 0.005 % ophthalmic solution, Place 1 drop into  both eyes at bedtime. , Disp: , Rfl:  .  omeprazole (PRILOSEC) 20 MG capsule, Take 1 capsule (20 mg total) by mouth every other day., Disp: 45 capsule, Rfl: 3 .  OVER THE COUNTER MEDICATION, Take 1 tablet by mouth daily as needed (constipation). Swiss Kriss herbal laxative, Disp: , Rfl:    Allergies  Allergen Reactions  . Codeine Itching    Pin and needles      Past Surgical History:  Procedure Laterality Date  . ABDOMINAL HYSTERECTOMY    . BREAST BIOPSY Right 11/03/2005   core biopsy w/ clip placement  . CHOLECYSTECTOMY    . COLONOSCOPY    . COLONOSCOPY WITH PROPOFOL N/A 11/05/2015   Procedure: COLONOSCOPY WITH PROPOFOL;  Surgeon: Lollie Sails, MD;  Location: [facility] ENDOSCOPY;  Service: Endoscopy;  Laterality: N/A;  . KNEE ARTHROSCOPY Right 02/21/08  . TOTAL HIP ARTHROPLASTY Right 04/09/2018   Procedure: TOTAL HIP ARTHROPLASTY ANTERIOR APPROACH;  Surgeon: Hessie Knows, MD;  Location: [facility] ORS;  Service: Orthopedics;  Laterality: Right;  . WISDOM TOOTH  EXTRACTION       Family History  Problem Relation Age of Onset  . Breast cancer Maternal Aunt        great  . Kidney disease Mother   . Diabetes Mother   . Hypertension Father   . Kidney disease Father   . Diabetes Brother   . Diabetes Paternal Grandmother      Social History   Tobacco Use  . Smoking status: Never Smoker  . Smokeless tobacco: Never Used  Substance Use Topics  . Alcohol use: Yes    Comment: rarely    With staff assistance, above reviewed with the patient today.  ROS: As per HPI, otherwise no specific complaints on a limited and focused system review   No results found for this or any previous visit (from the past 72 hour(s)).   PHQ2/9: Depression screen [facility] 2/9 08/28/2019 07/07/2019 04/01/2019 03/04/2019 06/17/2018  Decreased Interest 0 0 0 0 0  Down, Depressed, Hopeless 0 0 0 0 1  PHQ - 2 Score 0 0 0 0 1  Altered sleeping 0 0 0 - 0  Tired, decreased energy 0 0 0 - 0  Change in appetite 0 0 0 - 1  Feeling bad or failure about yourself  0 0 0 - 0  Trouble concentrating 0 0 0 - 0  Moving slowly or fidgety/restless 0 0 0 - 0  Suicidal thoughts 0 0 0 - 0  PHQ-9 Score 0 0 0 - 2  Difficult doing work/chores Not difficult at all Not difficult at all Not difficult at all - Not difficult at all   PHQ-2/9 Result is neg  Fall Risk: Fall Risk  08/28/2019 07/07/2019 04/01/2019 03/04/2019 11/20/2018  Falls in the past year? 0 0 0 0 0  Number falls in past yr: 0 0 0 0 0  Injury with Fall? 0 0 0 0 0  Follow up - - Falls evaluation completed Falls prevention discussed -      Objective:   Vitals:   08/28/19 0908  BP: 130/84  Pulse: 89  Resp: 16  Temp: (!) 97.1 F (36.2 C)  TempSrc: Temporal  SpO2: 98%  Weight: 195 lb 14.4 oz (88.9 kg)  Height: 5\' 2"  (1.575 m)    Body mass index is 35.83 kg/m.  Physical Exam   NAD, masked, pleasant HEENT - [place]/AT, sclera anicteric, PERRL, EOMI, conj - non-inj'ed, pharynx clear Neck - supple, no adenopathy,  no obvious  TM, carotids 2+ and = without bruits bilat Car - RRR without m/g/r Pulm- RR and effort normal at rest, CTA without wheeze or rales Back - no CVA tenderness Ext - trace at best bilat LE edema, not marked, nontender with palpation in the distal lower extremities including in the calfs bilaterally.  No cords, no erythema, no increased warmth, no pain with ankle motion. Dorsalis pedis pulses were palpable bilaterally, with sensation intact to light touch over the foot and distal lower extremity.  Adequate strength of the distal lower extremity. Neuro/psychiatric - affect was not flat, appropriate with conversation  Alert and oriented  Mild resting tremor noted in the left upper extremity, not new  Speech normal   Results for orders placed or performed during the hospital encounter of 07/08/19  Novel Coronavirus, NAA ([facility])   Specimen: Nasal Swab; Nasopharyngeal(NP) swabs in vial transport medium   NASOPHARYNGE  Result Value Ref Range   SARS-CoV-2, NAA Not Detected Not Detected       Assessment & Plan:    1. Essential hypertension Blood pressure has been very well controlled with the amlodipine, and she has had no further headaches while taking.  Discussed how it is unlikely that the Norvasc is causing her lower extremity swelling, although did discuss other medicines that we can try, some that will include a low-dose of a diuretic that will probably be more helpful.  It was mutually agreed to continue the amlodipine, as she felt it has been very helpful, and we will add a low-dose hydrochlorothiazide in combination presently. - hydrochlorothiazide (HYDRODIURIL) 25 MG tablet; Take 1 tablet (25 mg total) by mouth daily.  Dispense: 90 tablet; Refill: 3 She is to take the low-dose hydrochlorothiazide in the morning as well Discussed getting repeat labs in about 3 months time to ensure her electrolytes and kidney function are okay at that point.  Last labs were in November and they were  okay. 2. Dependent edema Do feel it is a strong component of her increased swelling as the day progresses, and she admitted she had been much less active in the recent past. Recent vascular studies as noted above were reassuring. Did not feel adding more labs to check today was needed, and plan to recheck at the latest in 3 months time and assess her response to the low-dose hydrochlorothiazide. - hydrochlorothiazide (HYDRODIURIL) 25 MG tablet; Take 1 tablet (25 mg total) by mouth daily.  Dispense: 90 tablet; Refill: 3  3. Obesity (BMI 35.0-39.9 without comorbidity) Weight has been relatively stable in the very recent past, although is a contributor to dependent edema.  Keeping weight control will be important. Wt Readings from Last 3 Encounters:  08/28/19 195 lb 14.4 oz (88.9 kg)  07/21/19 193 lb (87.5 kg)  07/10/19 194 lb (88 kg)   4. Mixed hyperlipidemia Recommended she continue the statin product, but take it in the evening and take the blood pressure medicines in the morning.  5.  Resting tremor Not new and monitoring  6.  Multiple thyroid nodules Did have biopsy in June 2020, follow-up recommended in 1 year and did review that with her and she was aware of that.  Follow-up in approximately 3 months time, sooner as needed, and will check some lab tests on that follow-up visit.     Towanda Malkin, MD 08/28/19 9:30 AM

## 2019-08-27 NOTE — Telephone Encounter (Signed)
  Patient called stating that she was started on a new medication for BP amlodipine.  She states that since starting she has had ankle swelling.  She states that it is mild and not painful. She states that her BP has been good. Last check was March 1 115/65 Hr 77.  No more headaches. She is requesting to speak with the Dr about the issue of swelling ankles. Call placed to office.  Per office she was scheduled for appointment tomorrow. Patient was advised.  Care advice read to patient.  She verbalized understanding. Reason for Disposition . Swollen ankle joint  (Exception: area of localized swelling which is itchy)  Answer Assessment - Initial Assessment Questions 1. LOCATION: "Which joint is swollen?"     Both ankles rt more than left 2. ONSET: "When did the swelling start?"    Stated with amlodipine 3. SIZE: "How large is the swelling?"     mild 4. PAIN: "Is there any pain?" If so, ask: "How bad is it?" (Scale 1-10; or mild, moderate, severe)     no 5. CAUSE: "What do you think caused the swollen joint?"     amlodipine 6. OTHER SYMPTOMS: "Do you have any other symptoms?" (e.g., fever, chest pain, difficulty breathing, calf pain)    none 7. PREGNANCY: "Is there any chance you are pregnant?" "When was your last menstrual period?"   N/A  Protocols used: ANKLE SWELLING-A-AH

## 2019-08-27 NOTE — Telephone Encounter (Signed)
Medication Refill - Medication: amLODipine (NORVASC) 10 MG tablet aspirin EC 81 MG tablet   Transferred patient to triage, she reported swelling in her feet/ankles since starting amlodipine.   Has the patient contacted their pharmacy? Yes.   (Agent: If no, request that the patient contact the pharmacy for the refill.) (Agent: If yes, when and what did the pharmacy advise?)  Preferred Pharmacy (with phone number or street name):  [facility], [address] [place] [address] [place] [address] [place] [address]  Phone: [phone] Fax: [phone]     Agent: Please be advised that RX refills may take up to 3 business days. We ask that you follow-up with your pharmacy.

## 2019-08-28 ENCOUNTER — Ambulatory Visit (INDEPENDENT_AMBULATORY_CARE_PROVIDER_SITE_OTHER): Payer: Medicare Other | Admitting: Internal Medicine

## 2019-08-28 ENCOUNTER — Encounter: Payer: Self-pay | Admitting: Internal Medicine

## 2019-08-28 ENCOUNTER — Other Ambulatory Visit: Payer: Self-pay

## 2019-08-28 VITALS — BP 130/84 | HR 89 | Temp 97.1°F | Resp 16 | Ht 62.0 in | Wt 195.9 lb

## 2019-08-28 DIAGNOSIS — E042 Nontoxic multinodular goiter: Secondary | ICD-10-CM

## 2019-08-28 DIAGNOSIS — E782 Mixed hyperlipidemia: Secondary | ICD-10-CM | POA: Insufficient documentation

## 2019-08-28 DIAGNOSIS — E669 Obesity, unspecified: Secondary | ICD-10-CM

## 2019-08-28 DIAGNOSIS — R609 Edema, unspecified: Secondary | ICD-10-CM

## 2019-08-28 DIAGNOSIS — G252 Other specified forms of tremor: Secondary | ICD-10-CM

## 2019-08-28 DIAGNOSIS — I10 Essential (primary) hypertension: Secondary | ICD-10-CM

## 2019-08-28 MED ORDER — HYDROCHLOROTHIAZIDE 25 MG PO TABS: 25.0000 mg | ORAL_TABLET | Freq: Every day | ORAL | 3 refills | Status: DC

## 2019-08-28 MED ORDER — ASPIRIN EC 81 MG PO TBEC: 81.0000 mg | DELAYED_RELEASE_TABLET | Freq: Every day | ORAL | 3 refills | Status: AC

## 2019-09-22 ENCOUNTER — Other Ambulatory Visit: Payer: Self-pay | Admitting: Family Medicine

## 2019-09-22 MED ORDER — AMLODIPINE BESYLATE 10 MG PO TABS: 10.0000 mg | ORAL_TABLET | Freq: Every day | ORAL | 0 refills | Status: DC

## 2019-09-22 NOTE — Telephone Encounter (Signed)
Pt says that she was seen by Dr. Roxan Hockey and was expecting a refill for her medication  amLODipine (NORVASC) 10 MG tablet. Pt would like to have sent to pharmacy below.    Pharmacy:  [facility], [place] [employer] Phone:  [phone]  Fax:  [phone]

## 2019-09-23 IMAGING — US BILATERAL CAROTID DUPLEX ULTRASOUND
1 series · 13 of 24 positions shown · non-contrast
Comparison: None.

CLINICAL DATA: Stroke symptoms, syncope

EXAM:
BILATERAL CAROTID DUPLEX ULTRASOUND
TECHNIQUE: Gray scale imaging, color Doppler and duplex ultrasound were
performed of bilateral carotid and vertebral arteries in the neck.

[Series 1: bilateral carotid duplex ultrasound · 0.06mm/px · 13 of 72 slices shown]
[im 1/72]
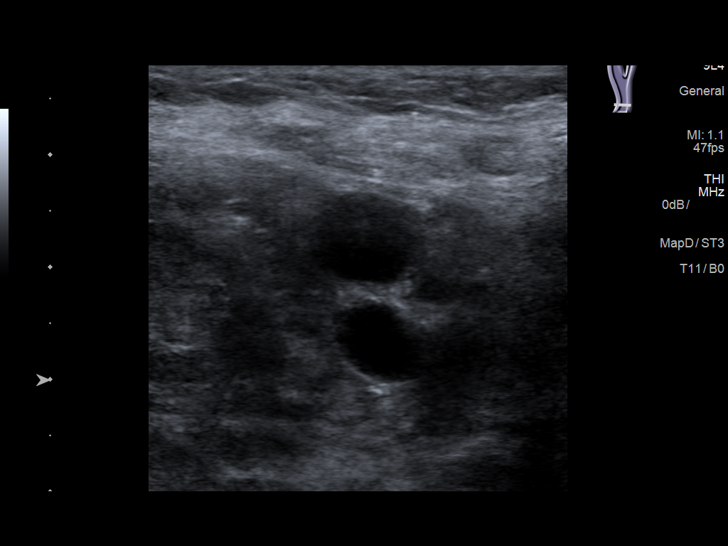
[im 7/72]
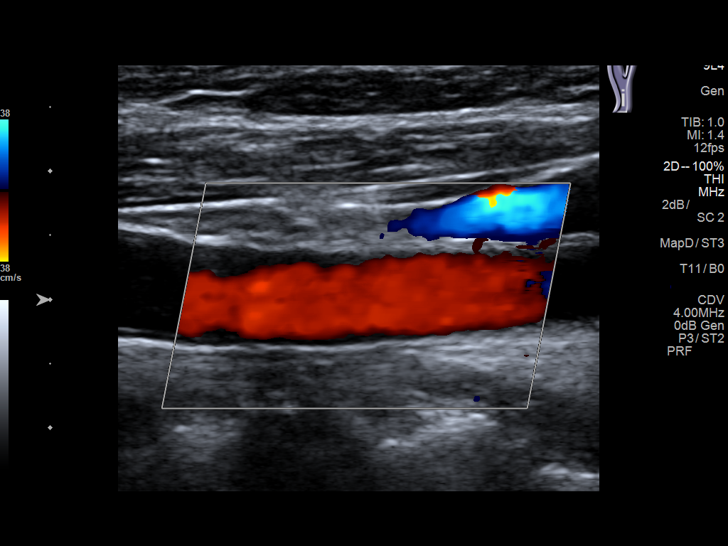
[im 13/72]
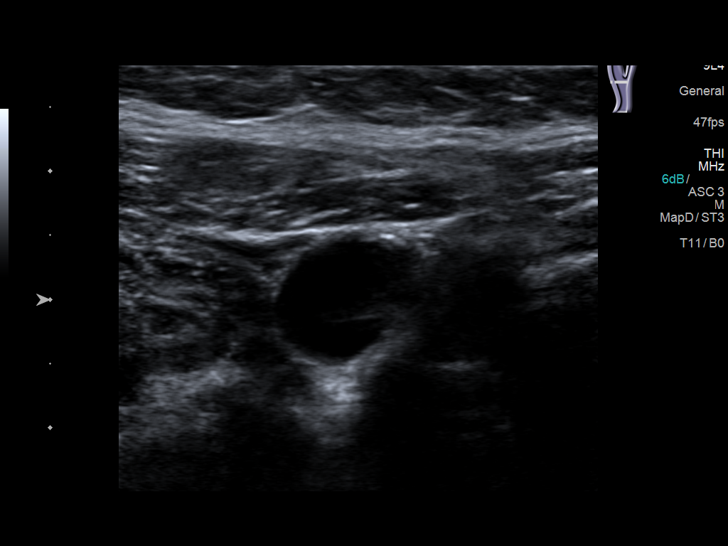
[im 19/72]
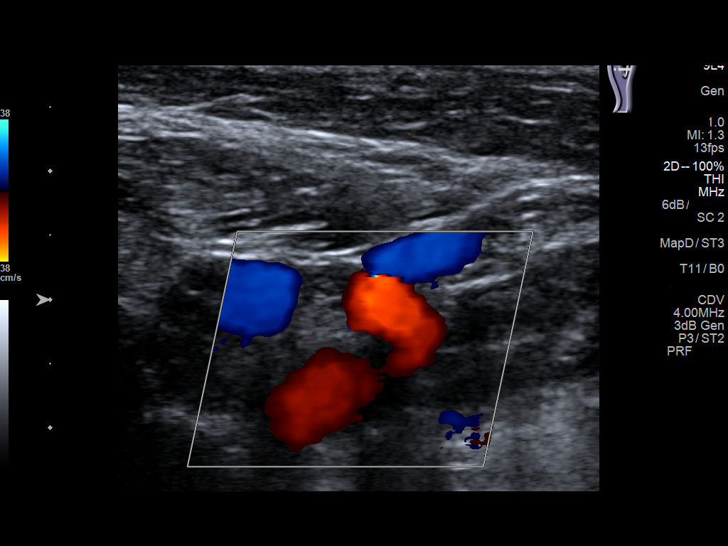
[im 25/72]
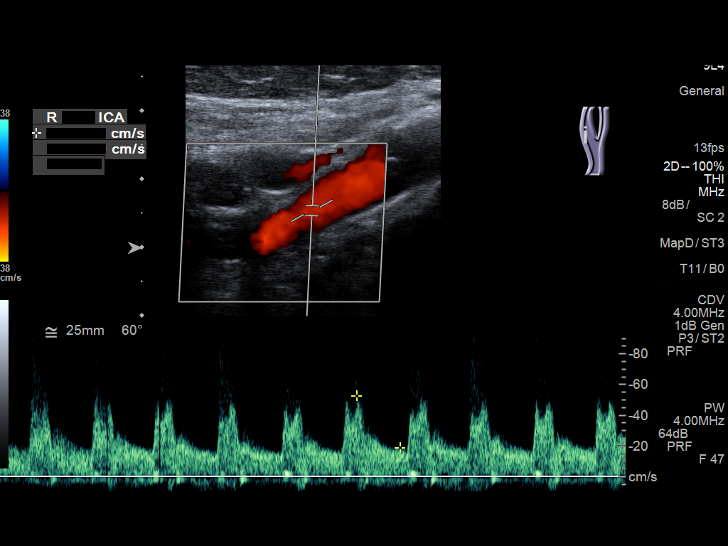
[im 31/72]
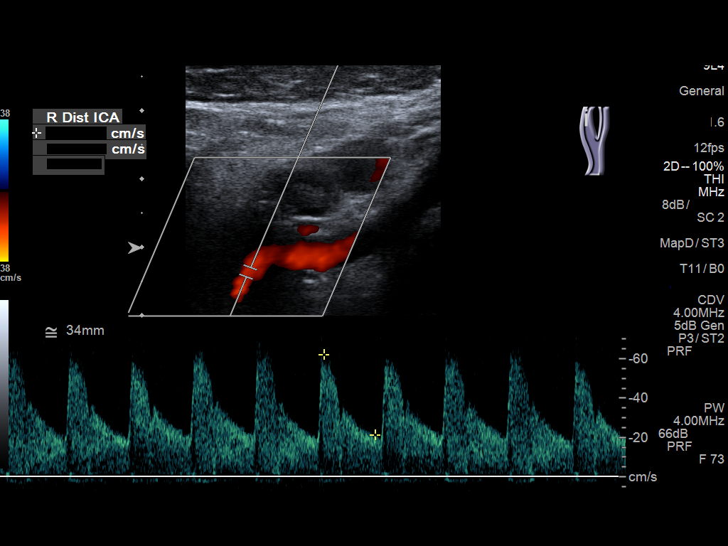
[im 38/72]
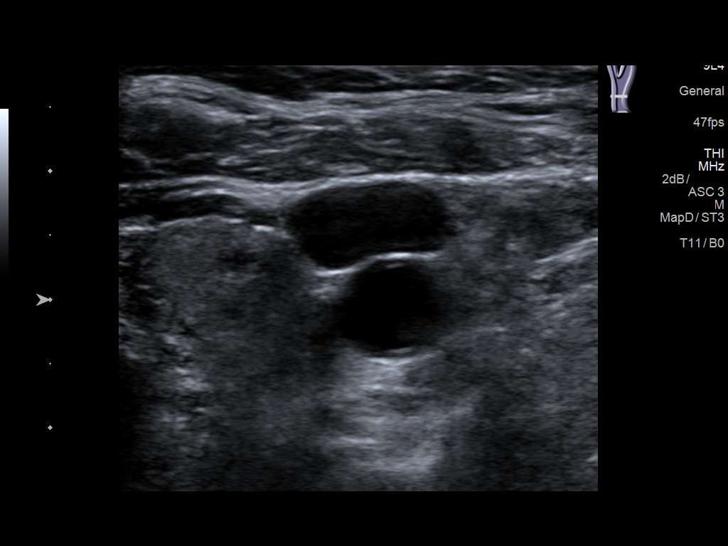
[im 41/72]
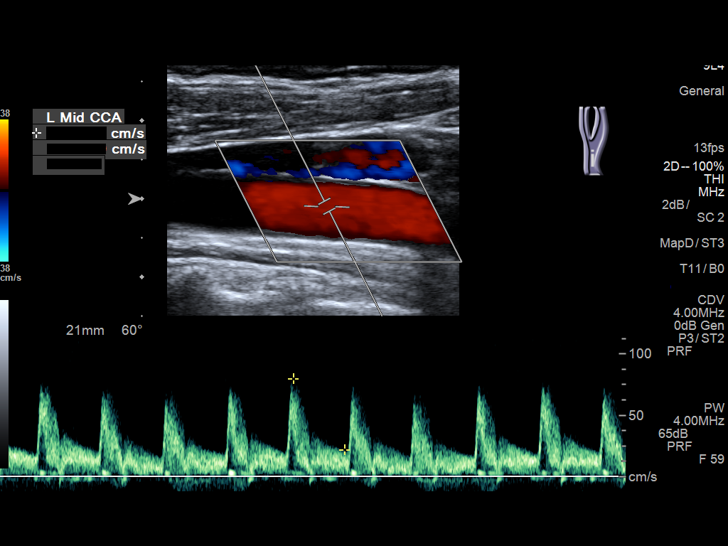
[im 47/72]
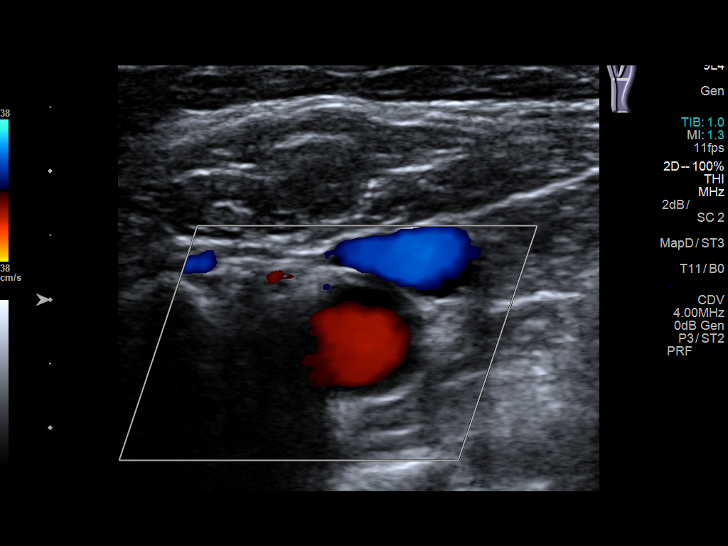
[im 53/72]
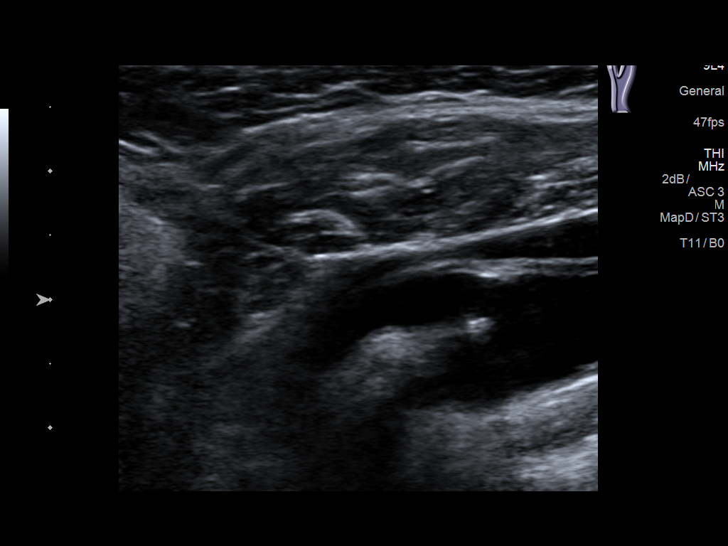
[im 59/72]
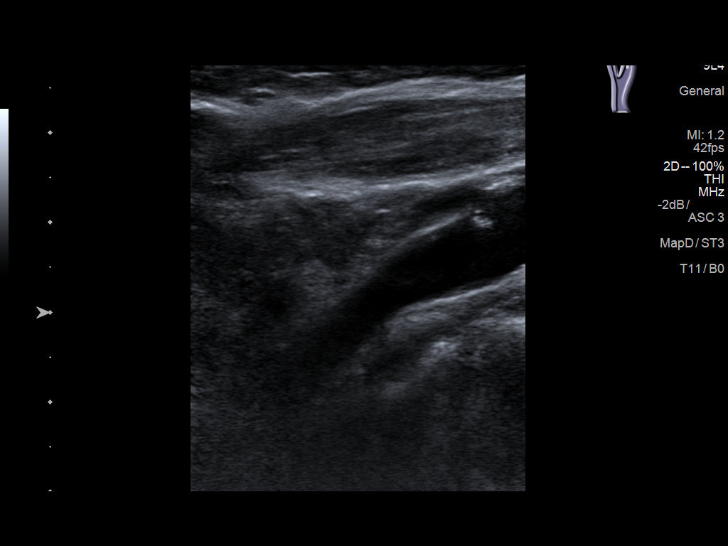
[im 65/72]
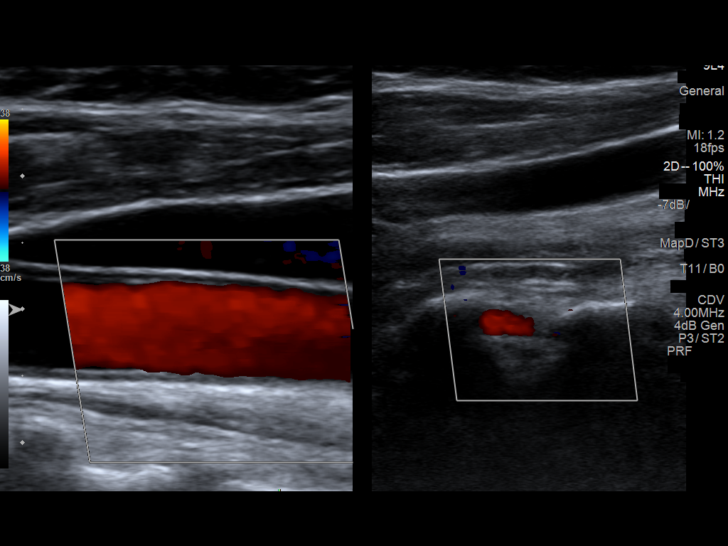
[im 72/72]
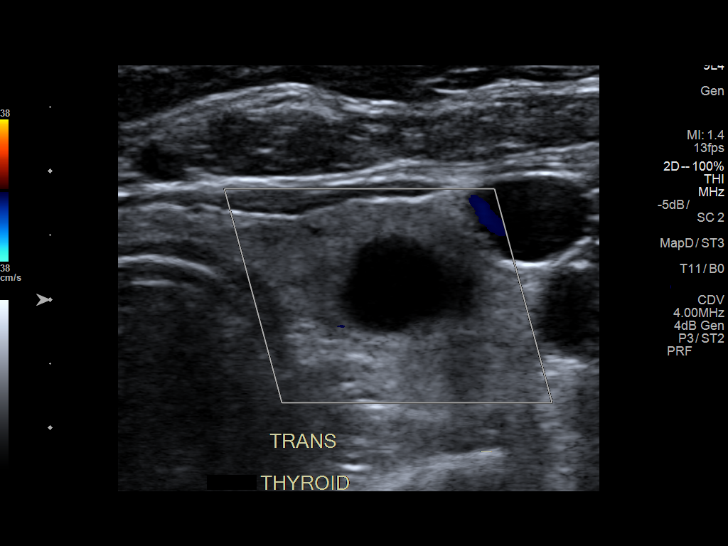

[13 of 24 positions shown; findings below may reference images not displayed]

FINDINGS: Criteria: Quantification of carotid stenosis is based on velocity
parameters that correlate the residual internal carotid diameter
with NASCET-based stenosis levels, using the diameter of the distal
internal carotid lumen as the denominator for stenosis measurement.

The following velocity measurements were obtained:

RIGHT

ICA: 76/24 cm/sec

CCA: 79/20 cm/sec

SYSTOLIC ICA/CCA RATIO:

ECA: 123 cm/sec

LEFT

ICA: 83/29 cm/sec

CCA: 80/22 cm/sec

SYSTOLIC ICA/CCA RATIO:

ECA: 118 cm/sec

RIGHT CAROTID ARTERY: Minor echogenic shadowing plaque formation. No
hemodynamically significant right ICA stenosis, velocity elevation,
or turbulent flow. Degree of narrowing less than 50%.

RIGHT VERTEBRAL ARTERY:  Antegrade

LEFT CAROTID ARTERY: Similar scattered minor echogenic plaque
formation. No hemodynamically significant left ICA stenosis,
velocity elevation, or turbulent flow.

LEFT VERTEBRAL ARTERY:  Antegrade

Hypoechoic solid right thyroid nodules noted measuring up to 1.6 cm.
Left thyroid cyst also evident. Recommend dedicated follow-up
thyroid ultrasound
IMPRESSION: Mild carotid atherosclerosis. No hemodynamically significant ICA
stenosis by ultrasound. Degree of narrowing less than 50%
bilaterally.

Patent antegrade vertebral flow bilaterally

Thyroid nodules measuring up to 1.6 cm on the right. Recommend
dedicated follow-up nonemergent thyroid ultrasound

## 2019-09-29 ENCOUNTER — Other Ambulatory Visit: Payer: Self-pay | Admitting: Family Medicine

## 2019-10-22 ENCOUNTER — Telehealth: Payer: Self-pay | Admitting: Family Medicine

## 2019-10-22 NOTE — Chronic Care Management (AMB) (Signed)
  Chronic Care Management   Outreach Note  10/22/2019 Name: Patricia Richardson MRN: [id number] DOB: [birth year]  Patricia Richardson is a 69 y.o. year old female who is a primary care patient of Hubbard Hartshorn, FNP. I reached out to [name] by phone today in response to a referral sent by Patricia Richardson's health plan.     An unsuccessful telephone outreach was attempted today. The patient was referred to the case management team for assistance with care management and care coordination.   Follow Up Plan: A HIPPA compliant phone message was left for the patient providing contact information and requesting a return call.  The care management team will reach out to the patient again over the next 7 days.  If patient returns call to provider office, please advise to call [facility] at [place], [address], [place], [place] [address] Direct Dial: [phone] [email or web address]: [email or web address]

## 2019-10-28 NOTE — Chronic Care Management (AMB) (Signed)
  Chronic Care Management   Outreach Note  10/28/2019 Name: LAVREN CHIAPPONE MRN: [id number] DOB: 1951-03-20  EADIE ZIESMER is a 69 y.o. year old female who is a primary care patient of Hubbard Hartshorn, FNP. I reached out to [name] by phone today in response to a referral sent by Ms. Sydelle S Brickey's health plan.     A second unsuccessful telephone outreach was attempted today. The patient was referred to the case management team for assistance with care management and care coordination.   Follow Up Plan: A HIPPA compliant phone message was left for the patient providing contact information and requesting a return call.  The care management team will reach out to the patient again over the next 7 days.  If patient returns call to provider office, please advise to call [facility] at [address], [address], [place], [place] [address] Direct Dial: [phone] [email or web address] Website: [email or web address]

## 2019-10-28 NOTE — Chronic Care Management (AMB) (Signed)
  Chronic Care Management   Note  10/28/2019 Name: [name] MRN: [id number] DOB: 1951/02/13  [name] is a 69 y.o. year old female who is a primary care patient of Hubbard Hartshorn, FNP. I reached out to [name] by phone today in response to a referral sent by Ms. Prestyn S Kerwood's health plan.     Ms. Hughley was given information about Chronic Care Management services today including:  1. CCM service includes personalized support from designated clinical staff supervised by her physician, including individualized plan of care and coordination with other care providers 2. 24/7 contact phone numbers for assistance for urgent and routine care needs. 3. Service will only be billed when office clinical staff spend 20 minutes or more in a month to coordinate care. 4. Only one practitioner may furnish and bill the service in a calendar month. 5. The patient may stop CCM services at any time (effective at the end of the month) by phone call to the office staff. 6. The patient will be responsible for cost sharing (co-pay) of up to 20% of the service fee (after annual deductible is met).  Patient did not agree to enrollment in care management services and does not wish to consider at this time.  Follow up plan: The patient has been provided with contact information for the care management team and has been advised to call with any health related questions or concerns.   Noreene Larsson, [address], [place], [place] [address] Direct Dial: [phone] [email or web address] Website: [email or web address]

## 2019-10-29 ENCOUNTER — Ambulatory Visit
Admission: EM | Admit: 2019-10-29 | Discharge: 2019-10-29 | Disposition: A | Discharge status: Home / Self Care | Payer: Medicare Other

## 2019-10-29 ENCOUNTER — Other Ambulatory Visit: Payer: Self-pay

## 2019-10-29 ENCOUNTER — Encounter: Payer: Self-pay | Admitting: Emergency Medicine

## 2019-10-29 DIAGNOSIS — B070 Plantar wart: Secondary | ICD-10-CM | POA: Diagnosis not present

## 2019-10-29 NOTE — Discharge Instructions (Signed)
Follow up with a podiatrist as soon as possible.

## 2019-10-29 NOTE — ED Triage Notes (Signed)
Pt c/o foreign body on the bottom of her right foot. She states she had a pedicure a couple of weeks ago. She started feeling like something was in her foot about a week later. She thinks it may be something from the pumice stone that was used. Pt last tetanus was 03/01/15

## 2019-10-29 NOTE — ED Provider Notes (Signed)
[name]    CSN: [id number] Arrival date & time: 10/29/19  0807      History   Chief Complaint Chief Complaint  Patient presents with  . foreign body in foot    right     HPI Patricia Richardson is a 69 y.o. female.   Patient presents with a tender hard area on the bottom of her right foot x 2 weeks.  She associates the onset of her symptoms with having a pedicure.  She states it feels like there is a foreign body stuck in the bottom of her foot "like a pebble".  No known injury.  No redness, fever, weakness, paresthesias, or other symptoms.  The history is provided by the patient.    Past Medical History:  Diagnosis Date  . 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 Arthritis   . Cystocele   . Depression   . Essential hypertension, benign 06/17/2018  . GERD (gastroesophageal reflux disease)   . Glaucoma   . History of chicken pox   . Hypertension   . 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 Mixed hyperlipidemia 08/28/2019  . Dependent edema 08/28/2019  . Leg pain 07/15/2019  . DJD (degenerative joint disease) 07/15/2019  . Gastroesophageal reflux disease without esophagitis 04/01/2019  . Multiple thyroid nodules 04/01/2019  . 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 Current use of proton pump inhibitor 09/23/2018  . Prediabetes 06/20/2018  . At risk for heart disease 06/20/2018  . Essential hypertension 06/17/2018  . Hx of completed stroke 06/17/2018  . Obesity (BMI 35.0-39.9 without comorbidity) 06/17/2018  . 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 Primary osteoarthritis of right hip 03/12/2017  . Dyspnea on exertion 03/01/2015  . Chronic insomnia 09/28/2014  . Anxiety 07/29/2014  . Bleeding hemorrhoids 07/29/2014    Past Surgical History:  Procedure Laterality Date  . ABDOMINAL HYSTERECTOMY    . BREAST BIOPSY Right 11/03/2005   core biopsy w/ clip placement  . CHOLECYSTECTOMY    . COLONOSCOPY    .  COLONOSCOPY WITH PROPOFOL N/A 11/05/2015   Procedure: COLONOSCOPY WITH PROPOFOL;  Surgeon: Lollie Sails, MD;  Location: [facility] ENDOSCOPY;  Service: Endoscopy;  Laterality: N/A;  . KNEE ARTHROSCOPY Right 02/21/08  . TOTAL HIP ARTHROPLASTY Right 04/09/2018   Procedure: TOTAL HIP ARTHROPLASTY ANTERIOR APPROACH;  Surgeon: Hessie Knows, MD;  Location: [facility] ORS;  Service: Orthopedics;  Laterality: Right;  . WISDOM TOOTH EXTRACTION      OB History   No obstetric history on file.      Home Medications    Prior to Admission medications   Medication Sig Start Date End Date Taking? Authorizing Provider  acetaminophen (TYLENOL) 500 MG tablet Take 1-2 tablets (500-1,000 mg total) by mouth every 6 (six) hours as needed for mild pain (pain score 1-3 or temp > 100.5). 04/11/18  Yes Dorise Hiss C, PA-C  amLODipine (NORVASC) 10 MG tablet TAKE 1 TABLET BY MOUTH EVERY DAY 09/29/19  Yes Sowles, Drue Stager, MD  aspirin EC 81 MG tablet Take 1 tablet (81 mg total) by mouth daily. 08/28/19  Yes Lebron Conners D, MD  atorvastatin (LIPITOR) 10 MG tablet TAKE 1 TABLET BY MOUTH AT BEDTIME 2 NIGHTS PER WEEK 06/05/19  Yes Hubbard Hartshorn, FNP  hydrochlorothiazide (HYDRODIURIL) 25 MG tablet Take 1 tablet (25 mg total) by mouth daily. 08/28/19  Yes Towanda Malkin, MD  latanoprost (XALATAN) 0.005 % ophthalmic solution  Place 1 drop into both eyes at bedtime.    Yes [provider]  omeprazole (PRILOSEC) 20 MG capsule Take 1 capsule (20 mg total) by mouth every other day. 04/18/19  Yes Hubbard Hartshorn, FNP  OVER THE COUNTER MEDICATION Take 1 tablet by mouth daily as needed (constipation). Swiss Kriss herbal laxative   Yes [provider]    Family History Family History  Problem Relation Age of Onset  . Breast cancer Maternal Aunt        great  . Kidney disease Mother   . Diabetes Mother   . Hypertension Father   . Kidney disease Father   . Diabetes Brother   . Diabetes Paternal  Grandmother     Social History Social History   Tobacco Use  . Smoking status: Never Smoker  . Smokeless tobacco: Never Used  Substance Use Topics  . Alcohol use: Yes    Comment: rarely  . Drug use: No     Allergies   Codeine   Review of Systems Review of Systems  Constitutional: Negative for chills and fever.  HENT: Negative for ear pain and sore throat.   Eyes: Negative for pain and visual disturbance.  Respiratory: Negative for cough and shortness of breath.   Cardiovascular: Negative for chest pain and palpitations.  Gastrointestinal: Negative for abdominal pain and vomiting.  Genitourinary: Negative for dysuria and hematuria.  Musculoskeletal: Negative for arthralgias and back pain.  Skin: Positive for wound. Negative for color change and rash.  Neurological: Negative for seizures and syncope.  All other systems reviewed and are negative.    Physical Exam Triage Vital Signs ED Triage Vitals  Enc Vitals Group     BP      Pulse      Resp      Temp      Temp src      SpO2      Weight      Height      Head Circumference      Peak Flow      Pain Score      Pain Loc      Pain Edu?      Excl. in [place]?    No data found.  Updated Vital Signs BP 131/80 (BP Location: Left Arm)   Pulse 82   Temp 98 F (36.7 C) (Oral)   Resp 18   Ht 5\' 2"  (1.575 m)   Wt 195 lb 15.8 oz (88.9 kg)   SpO2 98%   BMI 35.85 kg/m   Visual Acuity Right Eye Distance:   Left Eye Distance:   Bilateral Distance:    Right Eye Near:   Left Eye Near:    Bilateral Near:     Physical Exam Vitals and nursing note reviewed.  Constitutional:      General: She is not in acute distress.    Appearance: She is well-developed. She is not ill-appearing.  HENT:     Head: Normocephalic and atraumatic.     Mouth/Throat:     Mouth: Mucous membranes are moist.  Eyes:     Conjunctiva/sclera: Conjunctivae normal.  Cardiovascular:     Rate and Rhythm: Normal rate and regular rhythm.      Heart sounds: No murmur.  Pulmonary:     Effort: Pulmonary effort is normal. No respiratory distress.     Breath sounds: Normal breath sounds.  Abdominal:     Palpations: Abdomen is soft.     Tenderness: There  is no abdominal tenderness.  Musculoskeletal:     Cervical back: Neck supple.     Right lower leg: No edema.     Left lower leg: No edema.  Skin:    General: Skin is warm and dry.     Comments: Flesh-colored 0.5 cm hard area on plantar surface of right foot which appears to be a plantar's wart.  No open wound or drainage.  Tender to palpation.    Neurological:     General: No focal deficit present.     Mental Status: She is alert and oriented to person, place, and time.     Sensory: No sensory deficit.     Motor: No weakness.     Gait: Gait normal.  Psychiatric:        Mood and Affect: Mood normal.        Behavior: Behavior normal.      UC Treatments / Results  Labs (all labs ordered are listed, but only abnormal results are displayed) Labs Reviewed - No data to display  EKG   Radiology No results found.  Procedures Procedures (including critical care time)  Medications Ordered in UC Medications - No data to display  Initial Impression / Assessment and Plan / UC Course  I have reviewed the triage vital signs and the nursing notes.  Pertinent labs & imaging results that were available during my care of the patient were reviewed by me and considered in my medical decision making (see chart for details).   Plantars wart.  Education provided to patient regarding plantars warts.  Instructed her to follow-up with a podiatrist as soon as possible.  Patient agrees to plan of care.    Final Clinical Impressions(s) / UC Diagnoses   Final diagnoses:  Plantar wart of right foot     Discharge Instructions     Follow up with a podiatrist as soon as possible.        ED Prescriptions    None     PDMP not reviewed this encounter.   Sharion Balloon,  NP 10/29/19 [phone]

## 2019-12-22 ENCOUNTER — Ambulatory Visit: Payer: Medicare Other | Admitting: Internal Medicine

## 2019-12-24 ENCOUNTER — Encounter: Payer: Self-pay | Admitting: Internal Medicine

## 2019-12-24 ENCOUNTER — Ambulatory Visit (INDEPENDENT_AMBULATORY_CARE_PROVIDER_SITE_OTHER): Payer: Medicare Other | Admitting: Internal Medicine

## 2019-12-24 ENCOUNTER — Other Ambulatory Visit: Payer: Self-pay

## 2019-12-24 VITALS — BP 126/80 | HR 91 | Temp 98.1°F | Resp 16 | Ht 62.0 in | Wt 187.6 lb

## 2019-12-24 DIAGNOSIS — E782 Mixed hyperlipidemia: Secondary | ICD-10-CM

## 2019-12-24 DIAGNOSIS — G252 Other specified forms of tremor: Secondary | ICD-10-CM

## 2019-12-24 DIAGNOSIS — I10 Essential (primary) hypertension: Secondary | ICD-10-CM

## 2019-12-24 DIAGNOSIS — E042 Nontoxic multinodular goiter: Secondary | ICD-10-CM

## 2019-12-24 DIAGNOSIS — E669 Obesity, unspecified: Secondary | ICD-10-CM

## 2019-12-24 DIAGNOSIS — R609 Edema, unspecified: Secondary | ICD-10-CM

## 2019-12-24 DIAGNOSIS — Z8673 Personal history of transient ischemic attack (TIA), and cerebral infarction without residual deficits: Secondary | ICD-10-CM

## 2019-12-24 NOTE — Patient Instructions (Signed)
Please follow up with Dr. Celine Ahr, thyroid ultrasound for surveillance.

## 2019-12-24 NOTE — Progress Notes (Signed)
Patient ID: Patricia Richardson, female    DOB: 08/01/1950, 69 y.o.   MRN: [id number]  PCP: Towanda Malkin, MD  Chief Complaint  Patient presents with  . Follow-up    patient here to f/u, no specific complaint    Subjective:   Patricia Richardson is a 69 y.o. female, presents to clinic with CC of the following:  Chief Complaint  Patient presents with  . Follow-up    patient here to f/u, no specific complaint    HPI:  Patient is a 69 year old female patient of [name]. Her last visit was with me in March 2021 with lower extremity swelling.  That note was reviewed. She follows up today. He notes in general she has been feeling good.  HTN/LE swelling Medication regimen-amlodipine-10 mg daily, hydrochlorothiazide-25 mg daily She takes her medicines regularly She does check her blood pressures at home and they have been running 115-130/70-80's. she noted previously that she takes the blood pressure medicine at night, the statin product in the morning, and I recommended that she reverse that and she has done so. BP Readings from Last 3 Encounters:  12/24/19 126/80  10/29/19 131/80  08/28/19 130/84   She denies any recent chest pains, palpitations, shortness of breath except sometimes when outside noted trouble catching her breath - not marked,no increased headaches, noted some changes with brightness that affects her vision, and has a history of cataracts, and has an appointment to follow-up with her eye doctor in September.  Concerned previously that after amlodipine was initiated, may be a source to some increased lower extremity swelling.  Swelling was more at the end of the day, not much in the morning and was having no pain in the lower extremities nor any numbness or tingling or weakness in the lower extremities.  She notes that the swelling has pretty much resolved, and denies having any major swelling concerns in the recent past.  Of note, She was seen in the emergency  department on 07/08/2019 for intractable headache with high blood pressure and known hypertension noted. Her headache decreased to 4/10 after Toradol injection. Instructed patient to start taking the amlodipine prescribed by her PCP which she did.    HLD:She has history of stroke in 2003 without residual effects.  Medication regimen-atorvastatin 10mg  twice weekly and asiprin 81mg  without any issues.   Goal is LDL less than 70 Lab Results  Component Value Date   CHOL 144 04/10/2019   HDL 57 04/10/2019   LDLCALC 69 04/10/2019   TRIG 98 04/10/2019   CHOLHDL 2.5 04/10/2019   Denies myalgias  GERD: Medication regimen-omeprazole every other day.  Denies difficulty swallowing, abdominal pain, dark or black stools,  LEFT Hand Tremor: She has noticed a very slight intermittent tremor to the left hand that started about a year ago.  She thinks it may have increased some in the last months.  She notes her mother had similar tremor in the left hand. She notes she can calm the tremor with intentional movement and/or with using her other hand to touch/calm the left hand.  Discussed potential referral to a neurologist, and right now, she does not want to see neurologist.   History prediabetes/Obesity:  Lab Results  Component Value Date   HGBA1C 5.5 04/10/2019   HGBA1C 5.5 11/04/2018   HGBA1C 5.8 (H) 06/17/2018   Lab Results  Component Value Date   LDLCALC 69 04/10/2019   CREATININE 0.75 04/10/2019   Wt Readings from Last  3 Encounters:  12/24/19 187 lb 9.6 oz (85.1 kg)  10/29/19 195 lb 15.8 oz (88.9 kg)  08/28/19 195 lb 14.4 oz (88.9 kg)    She has extensive family history of DM.  Weights have been fairly stable in the recent past, and has lost weight in the more recent past. Trying to not eat at night which is helpful Denied polyuria, polydipsia, or polyphagia.  Multiple Thyroid Nodules: Saw Dr. Celine Ahr for Biopsy in June 2020 which was benighn; recommended 1 year [place] repeat for  surveillance.Not planning to f/u. She thinks is ok. Rec'ed she f/u as I pulled up the last visit with Dr. Celine Ahr on epic, and she noted surveillance ultrasounds are recommended for up to 5 years after the procedure was done.  The patient did agree to follow-up and have the ultrasound for surveillance.  She was seen by [facility] 07/21/2019 after noninvasive studies were done related to her lower extremity pain and back pain she noted. She noted occasionally she feels some left-sided hip discomfort, had her right hip replaced in the past, and denies any right hip problems. She feels that the pain in her lower back buttock area, intermittently, and denies any pain down the back of the leg Their assessment was as follows: ASSESSMENT AND PLAN:  1. 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 There is no evidence of abdominal aortic aneurysm. The largest measurement was 2.0 cm. Typically, velocities greater than 200 in the iliac arteries do indicate at least a 50% stenosis. The patient's right common iliac artery and her left external iliac artery both have velocities just over 200 which would indicate about a 50% stenosis. Generally, stenosis other than 70% generally more intervention. 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 I have stressed that she see a spine specialist and offered to place a referral however at this time the patient would like to wait and discuss with her primary care provider. Based upon what is found at the spine specialist if they find no evidence of degenerative disc disease we can perform an angiogram to fully rule out any possible causes of claudication. 2. Primary osteoarthritis involving multiple joints Have suggested with the patient that she see a  spine specialist to examine possible sources of her back pain and neurogenic claudication. At this time the patient wishes to discuss with her primary care provider and have the referral placed back  She noted today she really does not want to follow-up with the vascular surgery group, and I feel that is reasonable as we continue to monitor.  May need to over time.  He notes she has not really been bothered by the symptoms in the recent past enough to further investigate at this time with other studies or referrals.  Bought a puppy and notes that has been helpful in keeping her company.   Patient Active Problem List   Diagnosis Date Noted  . Mixed hyperlipidemia 08/28/2019  . Dependent edema 08/28/2019  . Leg pain 07/15/2019  . DJD (degenerative joint disease) 07/15/2019  . Gastroesophageal reflux disease without esophagitis 04/01/2019  . Multiple thyroid nodules 04/01/2019  . 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 Current use of proton pump inhibitor 09/23/2018  . Prediabetes 06/20/2018  . At risk for heart disease 06/20/2018  . Essential hypertension 06/17/2018  .  Hx of completed stroke 06/17/2018  . Obesity (BMI 35.0-39.9 without comorbidity) 06/17/2018  . 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 Primary osteoarthritis of right hip 03/12/2017  . Dyspnea on exertion 03/01/2015  . Chronic insomnia 09/28/2014  . Anxiety 07/29/2014  . Bleeding hemorrhoids 07/29/2014      Current Outpatient Medications:  .  acetaminophen (TYLENOL) 500 MG tablet, Take 1-2 tablets (500-1,000 mg total) by mouth every 6 (six) hours as needed for mild pain (pain score 1-3 or temp > 100.5)., Disp: , Rfl:  .  amLODipine (NORVASC) 10 MG tablet, TAKE 1 TABLET BY MOUTH EVERY DAY, Disp: 90 tablet, Rfl: 0 .  aspirin EC 81 MG tablet, Take 1 tablet (81 mg total) by mouth daily., Disp: 90 tablet, Rfl: 3 .  atorvastatin (LIPITOR) 10 MG tablet, TAKE 1 TABLET BY MOUTH AT BEDTIME 2 NIGHTS PER WEEK, Disp: 26  tablet, Rfl: 3 .  hydrochlorothiazide (HYDRODIURIL) 25 MG tablet, Take 1 tablet (25 mg total) by mouth daily., Disp: 90 tablet, Rfl: 3 .  latanoprost (XALATAN) 0.005 % ophthalmic solution, Place 1 drop into both eyes at bedtime. , Disp: , Rfl:  .  omeprazole (PRILOSEC) 20 MG capsule, Take 1 capsule (20 mg total) by mouth every other day., Disp: 45 capsule, Rfl: 3 .  OVER THE COUNTER MEDICATION, Take 1 tablet by mouth daily as needed (constipation). Swiss Kriss herbal laxative, Disp: , Rfl:    Allergies  Allergen Reactions  . Codeine Itching    Pin and needles      Past Surgical History:  Procedure Laterality Date  . ABDOMINAL HYSTERECTOMY    . BREAST BIOPSY Right 11/03/2005   core biopsy w/ clip placement  . CHOLECYSTECTOMY    . COLONOSCOPY    . COLONOSCOPY WITH PROPOFOL N/A 11/05/2015   Procedure: COLONOSCOPY WITH PROPOFOL;  Surgeon: Lollie Sails, MD;  Location: [facility] ENDOSCOPY;  Service: Endoscopy;  Laterality: N/A;  . KNEE ARTHROSCOPY Right 02/21/08  . TOTAL HIP ARTHROPLASTY Right 04/09/2018   Procedure: TOTAL HIP ARTHROPLASTY ANTERIOR APPROACH;  Surgeon: Hessie Knows, MD;  Location: [facility] ORS;  Service: Orthopedics;  Laterality: Right;  . WISDOM TOOTH EXTRACTION       Family History  Problem Relation Age of Onset  . Breast cancer Maternal Aunt        great  . Kidney disease Mother   . Diabetes Mother   . Hypertension Father   . Kidney disease Father   . Diabetes Brother   . Diabetes Paternal Grandmother      Social History   Tobacco Use  . Smoking status: Never Smoker  . Smokeless tobacco: Never Used  Substance Use Topics  . Alcohol use: Yes    Comment: rarely    With staff assistance, above reviewed with the patient today.  ROS: As per HPI, otherwise no specific complaints on a limited and focused system review   No results found for this or any previous visit (from the past 72 hour(s)).   PHQ2/9: Depression screen [facility] 2/9 12/24/2019 08/28/2019 07/07/2019  04/01/2019 03/04/2019  Decreased Interest 0 0 0 0 0  Down, Depressed, Hopeless 0 0 0 0 0  PHQ - 2 Score 0 0 0 0 0  Altered sleeping 0 0 0 0 -  Tired, decreased energy 0 0 0 0 -  Change in appetite 0 0 0 0 -  Feeling bad or failure about yourself  0 0 0 0 -  Trouble concentrating 0 0 0 0 -  Moving slowly or fidgety/restless 0 0 0 0 -  Suicidal thoughts 0 0 0 0 -  PHQ-9 Score 0 0 0 0 -  Difficult doing work/chores Not difficult at all Not difficult at all Not difficult at all Not difficult at all -   PHQ-2/9 Result is neg  Fall Risk: Fall Risk  12/24/2019 08/28/2019 07/07/2019 04/01/2019 03/04/2019  Falls in the past year? 0 0 0 0 0  Number falls in past yr: 0 0 0 0 0  Injury with Fall? 0 0 0 0 0  Follow up - - - Falls evaluation completed Falls prevention discussed      Objective:   Vitals:   12/24/19 1425  BP: 126/80  Pulse: 91  Resp: 16  Temp: 98.1 F (36.7 C)  TempSrc: Temporal  SpO2: 100%  Weight: 187 lb 9.6 oz (85.1 kg)  Height: 5\' 2"  (1.575 m)    Body mass index is 34.31 kg/m.  Physical Exam   NAD, masked, pleasant HEENT - [place]/AT, sclera anicteric, PERRL, EOMI, conj - non-inj'ed, pharynx clear Neck - supple, no adenopathy, no obvious TM, carotids 2+ and = without bruits bilat Car - RRR without m/g/r Pulm- RR and effort normal at rest, CTA without wheeze or rales Abd -soft, nontender diffusely, mildly obese, nondistended Back - no CVA tenderness Ext - no lower extremity edema, Neuro/psychiatric - affect was not flat, appropriate with conversation             Alert and oriented             Mild resting tremor noted in the left upper extremity, did seem to be a little more noticeable today intermittently.  It was not a classic pill-rolling tremor.  She had good finger-to-nose with no marked intention tremor component             Speech normal   Results for orders placed or performed during the hospital encounter of 07/08/19  Novel Coronavirus, NAA ([facility])    Specimen: Nasal Swab; Nasopharyngeal(NP) swabs in vial transport medium   NASOPHARYNGE  Result Value Ref Range   SARS-CoV-2, NAA Not Detected Not Detected   Last labs were reviewed.    Assessment & Plan:   1. Essential hypertension Her blood pressure remains controlled with the amlodipine, and did note to her today that sometimes this medicine can cause lower extremity swelling, although she likes how it has controlled her blood pressure very well, and she has not had any concerns with lower extremity swelling in the more recent past. We will continue the amlodipine presently as well as the low-dose hydrochlorothiazide product. We will check a BMP today. Continue taking those medicines in the morning. - BASIC METABOLIC PANEL WITH GFR  2. Dependent edema Much improved.  Continue to monitor - BASIC METABOLIC PANEL WITH GFR  3. Obesity (BMI 35.0-39.9 without comorbidity) She has had some success losing weight in the recent past.  She has changed her diet to not eating at nighttime which is helpful. Continued to encourage healthy diet and potentially losing a little more weight, with healthy weight maintenance important to help with her blood pressure.  4. Mixed hyperlipidemia Continue the statin product presently.  Continue taking it in the evening. Can recheck a lipid panel on her follow-up at the end of the year.  5. Resting tremor Discussed possibly seeing neurology, and ensuring we have the proper diagnosis for her tremor.  Could be a benign essential tremor, does not seem like a Parkinson's  concern, and she wanted to continue to monitor presently.  She noted her mom had something similar.   6. Multiple thyroid nodules Did feel like a follow-up ultrasound would be helpful, and she agreed to follow-up. The # for Dr. Amado Coe office was provided to her to help call and schedule a follow-up that includes an ultrasound.  7. Hx of completed stroke She inquired about taking the statin  if her cholesterol is good, and discussed the goal of having the LDL less than 70, and also had a statin is helpful in more ways than just lowering her cholesterol number. Felt important to continue the statin, and she agreed to do so.   Await lab result presently, and will schedule follow-up at the end of the year, approximately 5 months time, sooner as needed.     Towanda Malkin, MD 12/24/19 3:23 PM

## 2019-12-25 LAB — BASIC METABOLIC PANEL WITH GFR
BUN: 15 mg/dL (ref 7–25)
CO2: 27 mmol/L (ref 20–32)
Calcium: 10.5 mg/dL — ABNORMAL HIGH (ref 8.6–10.4)
Chloride: 104 mmol/L (ref 98–110)
Creat: 0.82 mg/dL (ref 0.60–0.93)
GFR, Est African American: 83 mL/min/{1.73_m2} (ref >=60)
GFR, Est Non African American: 72 mL/min/{1.73_m2} (ref >=60)
Glucose, Bld: 93 mg/dL (ref 65–99)
Potassium: 4.9 mmol/L (ref 3.5–5.3)
Sodium: 143 mmol/L (ref 135–146)

## 2019-12-30 ENCOUNTER — Other Ambulatory Visit: Payer: Self-pay

## 2019-12-30 ENCOUNTER — Ambulatory Visit (INDEPENDENT_AMBULATORY_CARE_PROVIDER_SITE_OTHER): Payer: Medicare Other | Admitting: General Surgery

## 2019-12-30 ENCOUNTER — Ambulatory Visit: Payer: Self-pay

## 2019-12-30 ENCOUNTER — Encounter: Payer: Self-pay | Admitting: General Surgery

## 2019-12-30 VITALS — BP 143/83 | HR 98 | Temp 98.5°F | Resp 12 | Ht 62.0 in | Wt 187.0 lb

## 2019-12-30 DIAGNOSIS — E042 Nontoxic multinodular goiter: Secondary | ICD-10-CM | POA: Diagnosis not present

## 2019-12-30 NOTE — Patient Instructions (Signed)
We will call you in June 2022 to schedule your Thyroid Ultrasound in Radiology. After your Ultrasound you will need to follow up with Dr.Cannon.

## 2019-12-30 NOTE — Progress Notes (Signed)
Patient ID: Patricia Richardson, female   DOB: [birth year], 69 y.o.   MRN: [id number]  Chief Complaint  Patient presents with  . Follow-up    thyroid nodules    HPI Patricia Richardson is a 69 y.o. female.   I saw her about a year ago after an ultrasound performed for further evaluation of a multinodular goiter seen on carotid duplex demonstrated a nodule that met criteria for biopsy.  I performed a biopsy in clinic and the results were benign.  She is here today for surveillance interval ultrasound..  She denies any heart palpitations or hand tremors.  No changes in the texture of her hair, skin, or fingernails.  No diarrhea or constipation.  Her weight has been stable.  No heat or cold intolerance.  She does endorse frequent throat clearing but ascribes this to chronic sinus drainage.  No dysphagia or pressure sensation in the neck while supine.  There have been no significant changes in her medical or surgical history since her last visit.   Past Medical History:  Diagnosis Date  . 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 Arthritis   . Cystocele   . Depression   . Essential hypertension, benign 06/17/2018  . GERD (gastroesophageal reflux disease)   . Glaucoma   . History of chicken pox   . Hypertension   . Left pontine CVA ([place]) 10/2001    Past Surgical History:  Procedure Laterality Date  . ABDOMINAL HYSTERECTOMY    . BREAST BIOPSY Right 11/03/2005   core biopsy w/ clip placement  . CHOLECYSTECTOMY    . COLONOSCOPY    . COLONOSCOPY WITH PROPOFOL N/A 11/05/2015   Procedure: COLONOSCOPY WITH PROPOFOL;  Surgeon: Lollie Sails, MD;  Location: [facility] ENDOSCOPY;  Service: Endoscopy;  Laterality: N/A;  . KNEE ARTHROSCOPY Right 02/21/08  . TOTAL HIP ARTHROPLASTY Right 04/09/2018   Procedure: TOTAL HIP ARTHROPLASTY ANTERIOR APPROACH;  Surgeon: Hessie Knows, MD;  Location: [facility] ORS;  Service: Orthopedics;  Laterality: Right;  . WISDOM TOOTH EXTRACTION      Family History  Problem Relation Age of Onset  .  Breast cancer Maternal Aunt        great  . Kidney disease Mother   . Diabetes Mother   . Hypertension Father   . Kidney disease Father   . Diabetes Brother   . Diabetes Paternal Grandmother     Social History Social History   Tobacco Use  . Smoking status: Never Smoker  . Smokeless tobacco: Never Used  Vaping Use  . Vaping Use: Never used  Substance Use Topics  . Alcohol use: Yes    Comment: rarely  . Drug use: No    Allergies  Allergen Reactions  . Codeine Itching    Pin and needles     Current Outpatient Medications  Medication Sig Dispense Refill  . acetaminophen (TYLENOL) 500 MG tablet Take 1-2 tablets (500-1,000 mg total) by mouth every 6 (six) hours as needed for mild pain (pain score 1-3 or temp > 100.5).    Marland Kitchen amLODipine (NORVASC) 10 MG tablet TAKE 1 TABLET BY MOUTH EVERY DAY 90 tablet 0  . aspirin EC 81 MG tablet Take 1 tablet (81 mg total) by mouth daily. 90 tablet 3  . atorvastatin (LIPITOR) 10 MG tablet TAKE 1 TABLET BY MOUTH AT BEDTIME 2 NIGHTS PER WEEK 26 tablet 3  . hydrochlorothiazide (HYDRODIURIL) 25 MG tablet Take 1 tablet (25 mg total) by mouth daily. 90 tablet 3  . latanoprost (XALATAN)  0.005 % ophthalmic solution Place 1 drop into both eyes at bedtime.     Marland Kitchen omeprazole (PRILOSEC) 20 MG capsule Take 1 capsule (20 mg total) by mouth every other day. 45 capsule 3  . OVER THE COUNTER MEDICATION Take 1 tablet by mouth daily as needed (constipation). Swiss Kriss herbal laxative     No current facility-administered medications for this visit.    Review of Systems Review of Systems  All other systems reviewed and are negative. Or as discussed in the history of present illness.  Blood pressure (!) 143/83, pulse 98, temperature 98.5 F (36.9 C), temperature source Oral, resp. rate 12, height 5' 2"  (1.575 m), weight 187 lb (84.8 kg), SpO2 96 %.  Physical Exam Physical Exam Constitutional:      General: She is not in acute distress.    Appearance: She  is obese.  HENT:     Head: Normocephalic and atraumatic.  Neck:     Comments: No palpable cervical or supraclavicular lymphadenopathy.  The trachea is midline.  The thyroid is slightly enlarged with a somewhat irregular contour, but is soft to rubbery in consistency.  The gland moves freely with deglutition. Cardiovascular:     Rate and Rhythm: Normal rate and regular rhythm.     Pulses: Normal pulses.  Pulmonary:     Effort: Pulmonary effort is normal.     Breath sounds: Normal breath sounds.  Abdominal:     General: Bowel sounds are normal.     Palpations: Abdomen is soft.     Comments: Protuberant, consistent with her level of obesity.  Genitourinary:    Comments: Deferred Musculoskeletal:        General: No deformity or signs of injury.  Skin:    General: Skin is warm and dry.  Neurological:     General: No focal deficit present.     Mental Status: She is alert and oriented to person, place, and time.  Psychiatric:        Mood and Affect: Mood normal.        Behavior: Behavior normal.     Data Reviewed I reviewed the ultrasound images obtained a year ago on November 12, 2018 which led to the referral to see me in the subsequent biopsy.  I also reviewed the cytopathology resulted on June 18th 2020, showing a benign nodule.  Thyroid function testing was normal a year ago; no new labs have been obtained.  I performed a bedside ultrasound in clinic today.  See separate documentation for full details, but briefly, the multiple nodules within her thyroid gland do appear to be stable.  There may be a slight increase in the cystic component of the dominant heterogeneous right-sided nodule.  Assessment This is a 69 year old woman with a multinodular goiter discovered incidentally on carotid ultrasound.  One of her nodules met criteria for biopsy.  This was done a year ago and it was benign.  She has additional nodules that meet criteria for surveillance.  On today's study performed in  clinic, there do not appear to be any significant changes.  Plan We will plan to have her obtain a formal ultrasound in radiology in 1 year to continue surveillance.  I discussed with her that we would continue annual interval ultrasound for a total of 5 years to monitor for any significant changes in the nodules of concern.  If any go on to meet criteria for biopsy, we will certainly perform that at the time.  I will see her  in 1 year after she has had her ultrasound in radiology.    Fredirick Maudlin 12/30/2019, 3:19 PM

## 2020-01-19 ENCOUNTER — Other Ambulatory Visit: Payer: Self-pay

## 2020-01-19 MED ORDER — AMLODIPINE BESYLATE 10 MG PO TABS: 10.0000 mg | ORAL_TABLET | Freq: Every day | ORAL | 3 refills | Status: DC

## 2020-02-17 ENCOUNTER — Other Ambulatory Visit: Payer: Self-pay | Admitting: Internal Medicine

## 2020-02-17 ENCOUNTER — Ambulatory Visit (INDEPENDENT_AMBULATORY_CARE_PROVIDER_SITE_OTHER): Payer: Medicare Other | Admitting: Internal Medicine

## 2020-02-17 ENCOUNTER — Encounter: Payer: Self-pay | Admitting: Internal Medicine

## 2020-02-17 ENCOUNTER — Other Ambulatory Visit: Payer: Self-pay

## 2020-02-17 VITALS — BP 130/72 | HR 98 | Temp 98.0°F | Resp 16 | Ht 62.0 in | Wt 183.7 lb

## 2020-02-17 DIAGNOSIS — F419 Anxiety disorder, unspecified: Secondary | ICD-10-CM | POA: Diagnosis not present

## 2020-02-17 DIAGNOSIS — Z23 Encounter for immunization: Secondary | ICD-10-CM | POA: Diagnosis not present

## 2020-02-17 DIAGNOSIS — G252 Other specified forms of tremor: Secondary | ICD-10-CM | POA: Diagnosis not present

## 2020-02-17 DIAGNOSIS — Z1231 Encounter for screening mammogram for malignant neoplasm of breast: Secondary | ICD-10-CM

## 2020-02-17 MED ORDER — SERTRALINE HCL 50 MG PO TABS: 50.0000 mg | ORAL_TABLET | Freq: Every day | ORAL | 1 refills | Status: DC

## 2020-02-17 NOTE — Progress Notes (Signed)
Patient ID: Patricia Richardson, female    DOB: Aug 22, 1950, 69 y.o.   MRN: [id number]  PCP: Towanda Malkin, MD  Chief Complaint  Patient presents with  . Follow-up  . Tremors    left hand tremors, makes her anxious    Subjective:   Patricia Richardson is a 69 y.o. female, presents to clinic with CC of the following:  Chief Complaint  Patient presents with  . Follow-up  . Tremors    left hand tremors, makes her anxious    HPI:  Patient is a 69 year old female Last visit with me was 27/21/2021 Follows up today for her tremor.  Since that visit, she did follow-up with endocrinology on 12/30/2019 with the following assessment/plan noted:  Assessment This is a 69 year old woman with a multinodular goiter discovered incidentally on carotid ultrasound.  One of her nodules met criteria for biopsy.  This was done a year ago and it was benign.  She has additional nodules that meet criteria for surveillance.  On today's study performed in clinic, there do not appear to be any significant changes.  Plan We will plan to have her obtain a formal ultrasound in radiology in 1 year to continue surveillance.  I discussed with her that we would continue annual interval ultrasound for a total of 5 years to monitor for any significant changes in the nodules of concern.  If any go on to meet criteria for biopsy, we will certainly perform that at the time.  I will see her in 1 year after she has had her ultrasound in radiology  On her last visit, the following was noted with respect to the tremor:   LEFT Hand Tremor: She has noticed a very slight intermittent tremor to the left hand that started about a year ago.  She thinks it may have increased some in the last months.  She notes her mother had similar tremor in the left hand. She notes she can calm the tremor with intentional movement and/or with using her other hand to touch/calm the left hand.  Discussed potential referral to a neurologist, and  right now, she does not want to see neurologist  Physical exam last visit noted- Mild resting tremor noted in the left upper extremity, did seem to be a little more noticeable today intermittently.  It was not a classic pill-rolling tremor.  She had good finger-to-nose with no marked intention tremor component  Assessment/plan from that visit was as follows-Discussed possibly seeing neurology, and ensuring we have the proper diagnosis for her tremor.  Could be a benign essential tremor, does not seem like a Parkinson's concern, and she wanted to continue to monitor presently.  She noted her mom had something similar.  She notes that since that visit, she has felt shaky all over, and the left-sided tremor has been much more noticeable, she thinks is related to her being more anxious.  She denies any increased stresses recently that have prompted her increased anxiety, has been struggling a little bit with sleep as a result.  At times she has a little ringing in her ears.  Not feeling depressed.  She noted we did talk about seeing neurology last visit as we discussed that today, with a follow-up in December made after our last visit to reassess.  She also noted she felt a scratching her throat a couple weeks ago, although has not persisted. Denied other symptoms of concern with no numbness down the left arm, although  she does note the left arm is not as strong as the right.  She thinks the increased tremors may have increased her anxiety some as it can be bothersome to her.  Patient Active Problem List   Diagnosis Date Noted  . Mixed hyperlipidemia 08/28/2019  . Dependent edema 08/28/2019  . Leg pain 07/15/2019  . DJD (degenerative joint disease) 07/15/2019  . Gastroesophageal reflux disease without esophagitis 04/01/2019  . Multiple thyroid nodules 04/01/2019  . 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 Current use of proton pump inhibitor 09/23/2018  . Prediabetes 06/20/2018  . At risk for heart disease  06/20/2018  . Essential hypertension 06/17/2018  . Hx of completed stroke 06/17/2018  . Obesity (BMI 35.0-39.9 without comorbidity) 06/17/2018  . 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 Primary osteoarthritis of right hip 03/12/2017  . Dyspnea on exertion 03/01/2015  . Chronic insomnia 09/28/2014  . Anxiety 07/29/2014  . Bleeding hemorrhoids 07/29/2014      Current Outpatient Medications:  .  acetaminophen (TYLENOL) 500 MG tablet, Take 1-2 tablets (500-1,000 mg total) by mouth every 6 (six) hours as needed for mild pain (pain score 1-3 or temp > 100.5)., Disp: , Rfl:  .  amLODipine (NORVASC) 10 MG tablet, Take 1 tablet (10 mg total) by mouth daily., Disp: 90 tablet, Rfl: 3 .  aspirin EC 81 MG tablet, Take 1 tablet (81 mg total) by mouth daily., Disp: 90 tablet, Rfl: 3 .  atorvastatin (LIPITOR) 10 MG tablet, TAKE 1 TABLET BY MOUTH AT BEDTIME 2 NIGHTS PER WEEK, Disp: 26 tablet, Rfl: 3 .  hydrochlorothiazide (HYDRODIURIL) 25 MG tablet, Take 1 tablet (25 mg total) by mouth daily., Disp: 90 tablet, Rfl: 3 .  latanoprost (XALATAN) 0.005 % ophthalmic solution, Place 1 drop into both eyes at bedtime. , Disp: , Rfl:  .  omeprazole (PRILOSEC) 20 MG capsule, Take 1 capsule (20 mg total) by mouth every other day., Disp: 45 capsule, Rfl: 3 .  OVER THE COUNTER MEDICATION, Take 1 tablet by mouth daily as needed (constipation). Swiss Kriss herbal laxative, Disp: , Rfl:    Allergies  Allergen Reactions  . Codeine Itching    Pin and needles      Past Surgical History:  Procedure Laterality Date  . ABDOMINAL HYSTERECTOMY    . BREAST BIOPSY Right 11/03/2005   core biopsy w/ clip placement  . CHOLECYSTECTOMY    . COLONOSCOPY    . COLONOSCOPY WITH PROPOFOL N/A 11/05/2015   Procedure: COLONOSCOPY WITH PROPOFOL;  Surgeon: Lollie Sails, MD;  Location: [facility] ENDOSCOPY;  Service: Endoscopy;  Laterality: N/A;  . KNEE ARTHROSCOPY Right 02/21/08  . TOTAL HIP ARTHROPLASTY Right 04/09/2018    Procedure: TOTAL HIP ARTHROPLASTY ANTERIOR APPROACH;  Surgeon: Hessie Knows, MD;  Location: [facility] ORS;  Service: Orthopedics;  Laterality: Right;  . WISDOM TOOTH EXTRACTION       Family History  Problem Relation Age of Onset  . Breast cancer Maternal Aunt        great  . Kidney disease Mother   . Diabetes Mother   . Hypertension Father   . Kidney disease Father   . Diabetes Brother   . Diabetes Paternal Grandmother      Social History   Tobacco Use  . Smoking status: Never Smoker  . Smokeless tobacco: Never Used  Substance Use Topics  . Alcohol use: Yes    Comment: rarely    With staff assistance, above reviewed with the patient today.  ROS: As per  HPI, otherwise no specific complaints on a limited and focused system review   No results found for this or any previous visit (from the past 72 hour(s)).   PHQ2/9: Depression screen [facility] 2/9 02/17/2020 12/24/2019 08/28/2019 07/07/2019 04/01/2019  Decreased Interest 0 0 0 0 0  Down, Depressed, Hopeless 0 0 0 0 0  PHQ - 2 Score 0 0 0 0 0  Altered sleeping - 0 0 0 0  Tired, decreased energy - 0 0 0 0  Change in appetite - 0 0 0 0  Feeling bad or failure about yourself  - 0 0 0 0  Trouble concentrating - 0 0 0 0  Moving slowly or fidgety/restless - 0 0 0 0  Suicidal thoughts - 0 0 0 0  PHQ-9 Score - 0 0 0 0  Difficult doing work/chores - Not difficult at all Not difficult at all Not difficult at all Not difficult at all   PHQ-2/9 Result is neg   GAD 7 : Generalized Anxiety Score 02/17/2020  Nervous, Anxious, on Edge 1  Control/stop worrying 1  Worry too much - different things 1  Trouble relaxing 1  Restless 1  Easily annoyed or irritable 0  Afraid - awful might happen 0  Total GAD 7 Score 5  Anxiety Difficulty Not difficult at all    GAD7 reviewed  Fall Risk: Fall Risk  02/17/2020 12/30/2019 12/24/2019 08/28/2019 07/07/2019  Falls in the past year? 0 0 0 0 0  Number falls in past yr: 0 - 0 0 0  Injury with Fall? 0 - 0  0 0  Follow up Falls evaluation completed - - - -      Objective:   Vitals:   02/17/20 1337  BP: 130/72  Pulse: 98  Resp: 16  Temp: 98 F (36.7 C)  TempSrc: Oral  SpO2: 100%  Weight: 183 lb 11.2 oz (83.3 kg)  Height: 5' 2"  (1.575 m)    Body mass index is 33.6 kg/m.  Physical Exam   NAD, masked, pleasant HEENT - [place]/AT, sclera anicteric, PERRL, EOMI, conj - non-inj'ed, TM's and canals clear, pharynx clear Neck - supple, no adenopathy, no obvious TM,  Car - RRR without m/g/r Pulm- RR and effort normal at rest, CTA without wheeze or rales Abd - soft, NT diffusely,  Back - no CVA tenderness Neuro/psychiatric - affect was not flat, appropriate with conversation  Alert and oriented  The resting tremor of the left upper extremity was increased today, also noticeable with intention, with it evident on finger-to-nose testing on the left, not on the right.  She had adequate strength of the distal upper extremities, although question of subtle weakness on the left versus the right although not marked, adequate grip bilaterally, pulses adequate distally on the left  Speech normal  Results for orders placed or performed in visit on 21/19/41  BASIC METABOLIC PANEL WITH GFR  Result Value Ref Range   Glucose, Bld 93 65 - 99 mg/dL   BUN 15 7 - 25 mg/dL   Creat 0.82 0.60 - 0.93 mg/dL   GFR, Est Non African American 72 > OR = 60 mL/min/1.37m   GFR, Est African American 83 > OR = 60 mL/min/1.762m  BUN/Creatinine Ratio NOT APPLICABLE 6 - 22 (calc)   Sodium 143 135 - 146 mmol/L   Potassium 4.9 3.5 - 5.3 mmol/L   Chloride 104 98 - 110 mmol/L   CO2 27 20 - 32 mmol/L   Calcium 10.5 (H)  8.6 - 10.4 mg/dL       Assessment & Plan:    1. Anxiety 2. Resting tremor Discussed again concerns with this tremor, and it is definitely increased since our last visit.  Has an intention component to it today as well.  She does seem more anxious today, with her GAD-7 reviewed and positive.  No marked  depressive concerns in association.  She feels the increased anxiety is exacerbating her tremor, and that is possible. Agreed to try a medicine for generalized anxiety and sertraline prescribed-50 mg daily to start.  Warned it may make her stomach upset in the beginning, but often goes away over time. Discussed the potential beta-blocker that sometimes is added although felt best to hold on that presently and see how she responds to the sertraline product. Also discussed neurology's involvement again, and we will see how she responds before a referral was placed. Did note the sertraline often takes time to show its benefits, weeks not days, and felt better scheduling a follow-up in approximately 4 weeks to reassess. Discussed other medicines that are often used that are short acting, and sometimes knocked her down some, and felt best not to add presently and await her response. Did review her last BMP, and may need to check some labs again sooner than December when we had planned if her symptoms are not improving or more problematic despite the above.   Tentatively, follow-up in 4 weeks time, sooner as needed.    Towanda Malkin, MD 02/17/20 1:55 PM

## 2020-02-17 NOTE — Patient Instructions (Signed)
A prescription for sertraline was sent to the pharmacy to start, take once daily

## 2020-02-19 ENCOUNTER — Other Ambulatory Visit: Payer: Self-pay

## 2020-02-19 DIAGNOSIS — Z8673 Personal history of transient ischemic attack (TIA), and cerebral infarction without residual deficits: Secondary | ICD-10-CM

## 2020-02-19 MED ORDER — ATORVASTATIN CALCIUM 10 MG PO TABS: ORAL_TABLET | ORAL | 3 refills | Status: DC

## 2020-02-23 ENCOUNTER — Other Ambulatory Visit: Payer: Self-pay

## 2020-02-23 ENCOUNTER — Other Ambulatory Visit: Payer: Self-pay | Admitting: Internal Medicine

## 2020-02-23 ENCOUNTER — Telehealth: Payer: Self-pay | Admitting: Internal Medicine

## 2020-02-23 DIAGNOSIS — F419 Anxiety disorder, unspecified: Secondary | ICD-10-CM

## 2020-02-23 DIAGNOSIS — K219 Gastro-esophageal reflux disease without esophagitis: Secondary | ICD-10-CM

## 2020-02-23 MED ORDER — OMEPRAZOLE 20 MG PO CPDR: 20.0000 mg | DELAYED_RELEASE_CAPSULE | ORAL | 1 refills | Status: DC

## 2020-02-23 MED ORDER — ESCITALOPRAM OXALATE 5 MG PO TABS: 5.0000 mg | ORAL_TABLET | Freq: Every day | ORAL | 1 refills | Status: DC

## 2020-02-23 NOTE — Progress Notes (Signed)
Patient noted GI SE's from the sertraline and requested a different medication to try. Will try a low dose of lexapro to start and assess.

## 2020-02-23 NOTE — Telephone Encounter (Signed)
Pt called to let Dr. Roxan Hockey know that she has been sick every day after taking sertraline (ZOLOFT) 50 MG tablet  /Pt stated it makes her feel nauseous and gives her diarrhea/ Pt would like another option/ please advise

## 2020-02-23 NOTE — Telephone Encounter (Signed)
Left detailed vm °

## 2020-02-23 NOTE — Telephone Encounter (Signed)
Will try a low dose of lexapro and assess. Should stop the sertraline.  Prescription sent to her pharmacy.

## 2020-03-04 ENCOUNTER — Ambulatory Visit (INDEPENDENT_AMBULATORY_CARE_PROVIDER_SITE_OTHER): Payer: Medicare Other

## 2020-03-04 ENCOUNTER — Other Ambulatory Visit: Payer: Self-pay

## 2020-03-04 VITALS — BP 122/82 | HR 82 | Temp 97.9°F | Resp 16 | Ht 62.0 in | Wt 181.2 lb

## 2020-03-04 DIAGNOSIS — Z0000 Encounter for general adult medical examination without abnormal findings: Secondary | ICD-10-CM

## 2020-03-04 NOTE — Patient Instructions (Signed)
Patricia Richardson , Thank you for taking time to come for your Medicare Wellness Visit. I appreciate your ongoing commitment to your health goals. Please review the following plan we discussed and let me know if I can assist you in the future.   Screening recommendations/referrals: Colonoscopy: done 03/28/19. Repeat in 2023 Mammogram: done 03/12/19. Scheduled for 03/22/20 Bone Density: done 03/12/19 Recommended yearly ophthalmology/optometry visit for glaucoma screening and checkup Recommended yearly dental visit for hygiene and checkup  Vaccinations: Influenza vaccine: done 02/17/20 Pneumococcal vaccine: done 02/06/19 Tdap vaccine: done 03/01/15 Shingles vaccine: Shingrix discussed. Please contact your pharmacy for coverage information.  Covid-19: done 07/31/19 & 08/26/19  Advanced directives: Please bring a copy of your health care power of attorney and living will to the office at your convenience.  Conditions/risks identified: Recommend drinking 6-8 glasses of water per day  Next appointment: Follow up in one year for your annual wellness visit    Preventive Care 65 Years and Older, Female Preventive care refers to lifestyle choices and visits with your health care provider that can promote health and wellness. What does preventive care include?  A yearly physical exam. This is also called an annual well check.  Dental exams once or twice a year.  Routine eye exams. Ask your health care provider how often you should have your eyes checked.  Personal lifestyle choices, including:  Daily care of your teeth and gums.  Regular physical activity.  Eating a healthy diet.  Avoiding tobacco and drug use.  Limiting alcohol use.  Practicing safe sex.  Taking low-dose aspirin every day.  Taking vitamin and mineral supplements as recommended by your health care provider. What happens during an annual well check? The services and screenings done by your health care provider during your  annual well check will depend on your age, overall health, lifestyle risk factors, and family history of disease. Counseling  Your health care provider may ask you questions about your:  Alcohol use.  Tobacco use.  Drug use.  Emotional well-being.  Home and relationship well-being.  Sexual activity.  Eating habits.  History of falls.  Memory and ability to understand (cognition).  Work and work Statistician.  Reproductive health. Screening  You may have the following tests or measurements:  Height, weight, and BMI.  Blood pressure.  Lipid and cholesterol levels. These may be checked every 5 years, or more frequently if you are over 16 years old.  Skin check.  Lung cancer screening. You may have this screening every year starting at age 84 if you have a 30-pack-year history of smoking and currently smoke or have quit within the past 15 years.  Fecal occult blood test (FOBT) of the stool. You may have this test every year starting at age 32.  Flexible sigmoidoscopy or colonoscopy. You may have a sigmoidoscopy every 5 years or a colonoscopy every 10 years starting at age 68.  Hepatitis C blood test.  Hepatitis B blood test.  Sexually transmitted disease (STD) testing.  Diabetes screening. This is done by checking your blood sugar (glucose) after you have not eaten for a while (fasting). You may have this done every 1-3 years.  Bone density scan. This is done to screen for osteoporosis. You may have this done starting at age 2.  Mammogram. This may be done every 1-2 years. Talk to your health care provider about how often you should have regular mammograms. Talk with your health care provider about your test results, treatment options, and if necessary,  the need for more tests. Vaccines  Your health care provider may recommend certain vaccines, such as:  Influenza vaccine. This is recommended every year.  Tetanus, diphtheria, and acellular pertussis (Tdap, Td)  vaccine. You may need a Td booster every 10 years.  Zoster vaccine. You may need this after age 66.  Pneumococcal 13-valent conjugate (PCV13) vaccine. One dose is recommended after age 29.  Pneumococcal polysaccharide (PPSV23) vaccine. One dose is recommended after age 37. Talk to your health care provider about which screenings and vaccines you need and how often you need them. This information is not intended to replace advice given to you by your health care provider. Make sure you discuss any questions you have with your health care provider. Document Released: 06/18/2015 Document Revised: 02/09/2016 Document Reviewed: 03/23/2015 Elsevier Interactive Patient Education  2017 [place] Prevention in the Home Falls can cause injuries. They can happen to people of all ages. There are many things you can do to make your home safe and to help prevent falls. What can I do on the outside of my home?  Regularly fix the edges of walkways and driveways and fix any cracks.  Remove anything that might make you trip as you walk through a door, such as a raised step or threshold.  Trim any bushes or trees on the path to your home.  Use bright outdoor lighting.  Clear any walking paths of anything that might make someone trip, such as rocks or tools.  Regularly check to see if handrails are loose or broken. Make sure that both sides of any steps have handrails.  Any raised decks and porches should have guardrails on the edges.  Have any leaves, snow, or ice cleared regularly.  Use sand or salt on walking paths during winter.  Clean up any spills in your garage right away. This includes oil or grease spills. What can I do in the bathroom?  Use night lights.  Install grab bars by the toilet and in the tub and shower. Do not use towel bars as grab bars.  Use non-skid mats or decals in the tub or shower.  If you need to sit down in the shower, use a plastic, non-slip  stool.  Keep the floor dry. Clean up any water that spills on the floor as soon as it happens.  Remove soap buildup in the tub or shower regularly.  Attach bath mats securely with double-sided non-slip rug tape.  Do not have throw rugs and other things on the floor that can make you trip. What can I do in the bedroom?  Use night lights.  Make sure that you have a light by your bed that is easy to reach.  Do not use any sheets or blankets that are too big for your bed. They should not hang down onto the floor.  Have a firm chair that has side arms. You can use this for support while you get dressed.  Do not have throw rugs and other things on the floor that can make you trip. What can I do in the kitchen?  Clean up any spills right away.  Avoid walking on wet floors.  Keep items that you use a lot in easy-to-reach places.  If you need to reach something above you, use a strong step stool that has a grab bar.  Keep electrical cords out of the way.  Do not use floor polish or wax that makes floors slippery. If you must use  wax, use non-skid floor wax.  Do not have throw rugs and other things on the floor that can make you trip. What can I do with my stairs?  Do not leave any items on the stairs.  Make sure that there are handrails on both sides of the stairs and use them. Fix handrails that are broken or loose. Make sure that handrails are as long as the stairways.  Check any carpeting to make sure that it is firmly attached to the stairs. Fix any carpet that is loose or worn.  Avoid having throw rugs at the top or bottom of the stairs. If you do have throw rugs, attach them to the floor with carpet tape.  Make sure that you have a light switch at the top of the stairs and the bottom of the stairs. If you do not have them, ask someone to add them for you. What else can I do to help prevent falls?  Wear shoes that:  Do not have high heels.  Have rubber bottoms.  Are  comfortable and fit you well.  Are closed at the toe. Do not wear sandals.  If you use a stepladder:  Make sure that it is fully opened. Do not climb a closed stepladder.  Make sure that both sides of the stepladder are locked into place.  Ask someone to hold it for you, if possible.  Clearly mark and make sure that you can see:  Any grab bars or handrails.  First and last steps.  Where the edge of each step is.  Use tools that help you move around (mobility aids) if they are needed. These include:  Canes.  Walkers.  Scooters.  Crutches.  Turn on the lights when you go into a dark area. Replace any light bulbs as soon as they burn out.  Set up your furniture so you have a clear path. Avoid moving your furniture around.  If any of your floors are uneven, fix them.  If there are any pets around you, be aware of where they are.  Review your medicines with your doctor. Some medicines can make you feel dizzy. This can increase your chance of falling. Ask your doctor what other things that you can do to help prevent falls. This information is not intended to replace advice given to you by your health care provider. Make sure you discuss any questions you have with your health care provider. Document Released: 03/18/2009 Document Revised: 10/28/2015 Document Reviewed: 06/26/2014 Elsevier Interactive Patient Education  2017 [employer].

## 2020-03-04 NOTE — Progress Notes (Signed)
Subjective:   VELETA YAMAMOTO is a 69 y.o. female who presents for Medicare Annual (Subsequent) preventive examination.  Review of Systems     Cardiac Risk Factors include: advanced age (>50men, >69 women);dyslipidemia;hypertension;obesity (BMI >30kg/m2)     Objective:    Today's Vitals   03/04/20 0820  BP: 122/82  Pulse: 82  Resp: 16  Temp: 97.9 F (36.6 C)  TempSrc: Oral  SpO2: 99%  Weight: 181 lb 3.2 oz (82.2 kg)  Height: 5\' 2"  (1.575 m)   Body mass index is 33.14 kg/m.  Advanced Directives 03/04/2020 10/29/2019 03/04/2019 04/09/2018 03/29/2018  Does Patient Have a Medical Advance Directive? Yes Yes Yes Yes Yes  Type of Paramedic of [place];Living will Living will;Healthcare Power of [place];Living will [place];Living will [place];Living will  Does patient want to make changes to medical advance directive? - - - No - Patient declined -  Copy of [place] in Chart? No - copy requested - No - copy requested - -    Current Medications (verified) Outpatient Encounter Medications as of 03/04/2020  Medication Sig  . acetaminophen (TYLENOL) 500 MG tablet Take 1-2 tablets (500-1,000 mg total) by mouth every 6 (six) hours as needed for mild pain (pain score 1-3 or temp > 100.5).  Marland Kitchen amLODipine (NORVASC) 10 MG tablet Take 1 tablet (10 mg total) by mouth daily.  Marland Kitchen aspirin EC 81 MG tablet Take 1 tablet (81 mg total) by mouth daily.  Marland Kitchen atorvastatin (LIPITOR) 10 MG tablet TAKE 1 TABLET BY MOUTH AT BEDTIME 2 NIGHTS PER WEEK  . escitalopram (LEXAPRO) 5 MG tablet Take 1 tablet (5 mg total) by mouth daily.  . hydrochlorothiazide (HYDRODIURIL) 25 MG tablet Take 1 tablet (25 mg total) by mouth daily.  Marland Kitchen latanoprost (XALATAN) 0.005 % ophthalmic solution Place 1 drop into both eyes at bedtime.   Marland Kitchen omeprazole (PRILOSEC) 20 MG capsule Take 1 capsule (20 mg total) by mouth every other  day.  Marland Kitchen OVER THE COUNTER MEDICATION Take 1 tablet by mouth daily as needed (constipation). Swiss Kriss herbal laxative   No facility-administered encounter medications on file as of 03/04/2020.    Allergies (verified) Codeine   History: Past Medical History:  Diagnosis Date  . 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 Arthritis   . Cystocele   . Depression   . Essential hypertension, benign 06/17/2018  . GERD (gastroesophageal reflux disease)   . Glaucoma   . History of chicken pox   . Hypertension   . 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 ABDOMINAL HYSTERECTOMY    . BREAST BIOPSY Right 11/03/2005   core biopsy w/ clip placement  . CHOLECYSTECTOMY    . COLONOSCOPY    . COLONOSCOPY WITH PROPOFOL N/A 11/05/2015   Procedure: COLONOSCOPY WITH PROPOFOL;  Surgeon: Lollie Sails, MD;  Location: [facility] ENDOSCOPY;  Service: Endoscopy;  Laterality: N/A;  . KNEE ARTHROSCOPY Right 02/21/08  . TOTAL HIP ARTHROPLASTY Right 04/09/2018   Procedure: TOTAL HIP ARTHROPLASTY ANTERIOR APPROACH;  Surgeon: Hessie Knows, MD;  Location: [facility] ORS;  Service: Orthopedics;  Laterality: Right;  . WISDOM TOOTH EXTRACTION     Family History  Problem Relation Age of Onset  . Breast cancer Maternal Aunt        great  . Kidney disease Mother   . Diabetes Mother   . Hypertension Father   .  Kidney disease Father   . Diabetes Brother   . Diabetes Paternal Grandmother    Social History   Socioeconomic History  . Marital status: Divorced    Spouse name: Not on file  . Number of children: 2  . Years of education: 77  . Highest education level: Not on file  Occupational History  . Not on file  Tobacco Use  . Smoking status: Never Smoker  . Smokeless tobacco: Never Used  Vaping Use  . Vaping Use: Never used  Substance and Sexual Activity  . Alcohol use: Yes    Comment: rarely  . Drug use: No  . Sexual activity: Not Currently  Other Topics Concern  . Not on  file  Social History Narrative   Pt lives alone   Social Determinants of Health   Financial Resource Strain: Low Risk   . Difficulty of Paying Living Expenses: Not hard at all  Food Insecurity: No Food Insecurity  . Worried About Charity fundraiser in the Last Year: Never true  . Ran Out of Food in the Last Year: Never true  Transportation Needs: No Transportation Needs  . Lack of Transportation (Medical): No  . Lack of Transportation (Non-Medical): No  Physical Activity: Inactive  . Days of Exercise per Week: 0 days  . Minutes of Exercise per Session: 0 min  Stress: No Stress Concern Present  . Feeling of Stress : Only a little  Social Connections: Moderately Isolated  . Frequency of Communication with Friends and Family: More than three times a week  . Frequency of Social Gatherings with Friends and Family: Twice a week  . Attends Religious Services: More than 4 times per year  . Active Member of Clubs or Organizations: No  . Attends Archivist Meetings: Never  . Marital Status: Divorced    Tobacco Counseling Counseling given: Not Answered   Clinical Intake:  Pre-visit preparation completed: Yes  Pain : No/denies pain     BMI - recorded: 33.14 Nutritional Status: BMI > 30  Obese Nutritional Risks: Nausea/ vomitting/ diarrhea Diabetes: No  How often do you need to have someone help you when you read instructions, pamphlets, or other written materials from your doctor or pharmacy?: 1 - Never    Interpreter Needed?: No  Information entered by :: Clemetine Marker LPN   Activities of Daily Living In your present state of health, do you have any difficulty performing the following activities: 03/04/2020 02/17/2020  Hearing? N N  Comment declines hearing aids -  Vision? N N  Difficulty concentrating or making decisions? N N  Walking or climbing stairs? N N  Dressing or bathing? N N  Doing errands, shopping? N N  Preparing Food and eating ? N -  Using the  Toilet? N -  In the past six months, have you accidently leaked urine? N -  Do you have problems with loss of bowel control? N -  Managing your Medications? N -  Managing your Finances? N -  Housekeeping or managing your Housekeeping? N -  Some recent data might be hidden    Patient Care Team: Towanda Malkin, MD as PCP - General (Internal Medicine) Lorelee Cover., MD (Ophthalmology)  Indicate any recent Medical Services you may have received from other than [facility] providers in the past year (date may be approximate).     Assessment:   This is a routine wellness examination for Kanyah.  Hearing/Vision screen  Hearing Screening   125Hz  250Hz   500Hz  1000Hz  2000Hz  3000Hz  4000Hz  6000Hz  8000Hz   Right ear:           Left ear:           Comments: Pt denies hearing difficulty  Vision Screening Comments: Annual vision screenings done by Dr. 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 Increase physical activity     Recommend increasing physical activity to at least 3 days per week      Depression Screen PHQ 2/9 Scores 03/04/2020 02/17/2020 12/24/2019 08/28/2019 07/07/2019 04/01/2019 03/04/2019  PHQ - 2 Score 0 0 0 0 0 0 0  PHQ- 9 Score - - 0 0 0 0 -    Fall Risk Fall Risk  03/04/2020 02/17/2020 12/30/2019 12/24/2019 08/28/2019  Falls in the past year? 0 0 0 0 0  Number falls in past yr: 0 0 - 0 0  Injury with Fall? 0 0 - 0 0  Risk for fall due to : No Fall Risks - - - -  Follow up Falls prevention discussed Falls evaluation completed - - -    Any stairs in or around the home? No  If so, are there any without handrails? No  Home free of loose throw rugs in walkways, pet beds, electrical cords, etc? Yes  Adequate lighting in your home to reduce risk of falls? Yes   ASSISTIVE DEVICES UTILIZED  TO PREVENT FALLS:  Life alert? No  Use of a cane, walker or w/c? No  Grab bars in the bathroom? Yes  Shower chair or bench in shower? Yes  Elevated toilet seat or a handicapped toilet? Yes   TIMED UP AND GO:  Was the test performed? Yes .  Length of time to ambulate 10 feet: 5 sec.   Gait steady and fast without use of assistive device  Cognitive Function: Pt declined 6CIT for 2021 AWV; states no memory issues.      6CIT Screen 03/04/2019  What Year? 0 points  What month? 0 points  What time? 0 points  Count back from 20 0 points  Months in reverse 0 points  Repeat phrase 0 points  Total Score 0    Immunizations Immunization History  Administered Date(s) Administered  . Fluad Quad(high Dose 65+) 02/06/2019, 02/17/2020  . Influenza Split 03/01/2015  . Influenza, High Dose Seasonal PF 04/10/2018  . Influenza-Unspecified 03/06/2014, 03/06/2016, 03/13/2017  . PFIZER SARS-COV-2 Vaccination 07/31/2019, 08/26/2019  . Pneumococcal Conjugate-13 02/06/2019  . Pneumococcal Polysaccharide-23 02/25/2014, 03/13/2017  . Tdap 03/01/2015  . Zoster 11/04/2015    TDAP status: Up to date   Flu Vaccine status: Up to date   Pneumococcal vaccine status: Up to date   Covid-19 vaccine status: Completed vaccines  Qualifies for Shingles Vaccine? Yes   Zostavax completed Yes   Shingrix Completed?: No.    Education has been provided regarding the importance of this vaccine. Patient has been advised to call insurance company to determine out of pocket expense if they have not yet received this vaccine. Advised may also receive vaccine at local pharmacy or Health Dept. Verbalized acceptance and understanding.  Screening Tests Health Maintenance  Topic Date Due  . MAMMOGRAM  03/11/2020  . COLONOSCOPY  03/27/2022  . DEXA SCAN  03/11/2024  . TETANUS/TDAP  02/28/2025  . INFLUENZA VACCINE  Completed  . COVID-19 Vaccine  Completed  . Hepatitis C Screening  Completed  . PNA vac Low Risk  Adult  Completed    Health Maintenance  There are no preventive care reminders to display for this patient.  Colorectal cancer screening: Completed 03/28/19. Repeat every 3 years   Mammogram status: Completed 03/12/19. Repeat every year. Scheduled for 03/22/20  Bone Density status: Completed 03/12/19. Results reflect: Bone density results: NORMAL. Repeat every 2 years.  Lung Cancer Screening: (Low Dose CT Chest recommended if Age 53-80 years, 30 pack-year currently smoking OR have quit w/in 15years.) does not qualify.   Additional Screening:  Hepatitis C Screening: does qualify; Completed 11/04/18  Vision Screening: Recommended annual ophthalmology exams for early detection of glaucoma and other disorders of the eye. Is the patient up to date with their annual eye exam?  Yes  Who is the provider or what is the name of the office in which the patient attends annual eye exams? Dr. Gloriann Loan  Dental Screening: Recommended annual dental exams for proper oral hygiene  Community Resource Referral / Chronic Care Management: CRR required this visit?  No   CCM required this visit?  No      Plan:     I have personally reviewed and noted the following in the patient's chart:   . Medical and social history . Use of alcohol, tobacco or illicit drugs  . Current medications and supplements . Functional ability and status . Nutritional status . Physical activity . Advanced directives . List of other physicians . Hospitalizations, surgeries, and ER visits in previous 12 months . Vitals . Screenings to include cognitive, depression, and falls . Referrals and appointments  In addition, I have reviewed and discussed with patient certain preventive protocols, quality metrics, and best practice recommendations. A written personalized care plan for preventive services as well as general preventive health recommendations were provided to patient.     Clemetine Marker, LPN   12/03/1599   Nurse Notes:  none

## 2020-03-05 NOTE — Progress Notes (Signed)
Patient ID: Patricia Richardson, female    DOB: 1950/06/07, 69 y.o.   MRN: [id number]  PCP: Towanda Malkin, MD  Chief Complaint  Patient presents with  . Tremors  . Anxiety    Subjective:   Patricia Richardson is a 69 y.o. female, presents to clinic with CC of the following:  Chief Complaint  Patient presents with  . Tremors  . Anxiety    HPI:  Patient is a 69 year old female Last visit with me was 02/17/2020 Follows up today. She was started on sertraline last visit for anxiety and noted some GI side effects.  Was changed to a Lexapro product  To review, she did follow-up with endocrinology on 12/30/2019 with the following assessment/plan noted:             Assessment This is a 69 year old woman with a multinodular goiter discovered incidentally on carotid ultrasound. One of her nodules met criteria for biopsy. This was done a year ago and it was benign. She has additional nodules that meet criteria for surveillance. On today's study performed in clinic, there do not appear to be any significant changes.             Plan We will plan to have her obtain a formal ultrasound in radiology in 1 year to continue surveillance. I discussed with her that we would continue annual interval ultrasound for a total of 5 years to monitor for any significant changes in the nodules of concern. If any go on to meet criteria for biopsy, we will certainly perform that at the time. I will see her in 1 year after she has had her ultrasound in radiology       LEFT Hand Tremor: She has noticed a very slightintermittenttremor to the left handthat started about a year ago, and was increased on prior to our last visit. She notes her mother had similar tremor in the left hand. She notes she can calm the tremor with intentional movement and/or with using her other hand to touch/calm the left hand.Discussed potential referral to a neurologist although she wanted to hold on that  previously.  Anxiety - She felt the increased anxiety was exacerbating her tremor, and that was possible.  Last visit agreed to try a medicine for generalized anxiety and sertraline prescribed-50 mg daily, although she noted GI side effects with this and was changed to Lexapro -5 mg daily  She notes that since our last visit, the anxiety seems to be a little better controlled, and she is tolerating the Lexapro product without side effects.  She is hesitant to increase that dose further given the concerns with side effects from the sertraline. She notes that the left-sided tremor has not really changed much as the anxiety seems a little better controlled.  It is not increased, although still is present.  She notes it is not adversely affecting her day-to-day activities, is not dropping things, and denies numbness or tingling down that left upper extremity.  She is right-handed. She also noted her mom had a mild tremor. She denies other symptoms including no chest pains, palpitations, shortness of breath, no increased lower extremity swelling.  At times there may be a little swelling at the end of the day, and she elevates her legs which is helpful. She stated after her hip replacement, her one leg was a little shorter than the other, and has an insert to wear, and has felt a little off balance at times  as a result.  Denies any recent loss of balance or falls.   Patient Active Problem List   Diagnosis Date Noted  . Mixed hyperlipidemia 08/28/2019  . Dependent edema 08/28/2019  . Leg pain 07/15/2019  . DJD (degenerative joint disease) 07/15/2019  . Gastroesophageal reflux disease without esophagitis 04/01/2019  . Multiple thyroid nodules 04/01/2019  . 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 Current use of proton pump inhibitor 09/23/2018  . Prediabetes 06/20/2018  . At risk for heart disease 06/20/2018  . Essential hypertension 06/17/2018  . Hx of completed stroke 06/17/2018  . Obesity (BMI 35.0-39.9  without comorbidity) 06/17/2018  . 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 Primary osteoarthritis of right hip 03/12/2017  . Dyspnea on exertion 03/01/2015  . Chronic insomnia 09/28/2014  . Anxiety 07/29/2014  . Bleeding hemorrhoids 07/29/2014      Current Outpatient Medications:  .  acetaminophen (TYLENOL) 500 MG tablet, Take 1-2 tablets (500-1,000 mg total) by mouth every 6 (six) hours as needed for mild pain (pain score 1-3 or temp > 100.5)., Disp: , Rfl:  .  amLODipine (NORVASC) 10 MG tablet, Take 1 tablet (10 mg total) by mouth daily., Disp: 90 tablet, Rfl: 3 .  aspirin EC 81 MG tablet, Take 1 tablet (81 mg total) by mouth daily., Disp: 90 tablet, Rfl: 3 .  atorvastatin (LIPITOR) 10 MG tablet, TAKE 1 TABLET BY MOUTH AT BEDTIME 2 NIGHTS PER WEEK, Disp: 26 tablet, Rfl: 3 .  escitalopram (LEXAPRO) 5 MG tablet, Take 1 tablet (5 mg total) by mouth daily., Disp: 30 tablet, Rfl: 1 .  hydrochlorothiazide (HYDRODIURIL) 25 MG tablet, Take 1 tablet (25 mg total) by mouth daily., Disp: 90 tablet, Rfl: 3 .  latanoprost (XALATAN) 0.005 % ophthalmic solution, Place 1 drop into both eyes at bedtime. , Disp: , Rfl:  .  omeprazole (PRILOSEC) 20 MG capsule, Take 1 capsule (20 mg total) by mouth every other day., Disp: 45 capsule, Rfl: 1 .  OVER THE COUNTER MEDICATION, Take 1 tablet by mouth daily as needed (constipation). Swiss Kriss herbal laxative, Disp: , Rfl:    Allergies  Allergen Reactions  . Codeine Itching    Pin and needles      Past Surgical History:  Procedure Laterality Date  . ABDOMINAL HYSTERECTOMY    . BREAST BIOPSY Right 11/03/2005   core biopsy w/ clip placement  . CHOLECYSTECTOMY    . COLONOSCOPY    . COLONOSCOPY WITH PROPOFOL N/A 11/05/2015   Procedure: COLONOSCOPY WITH PROPOFOL;  Surgeon: Lollie Sails, MD;  Location: [facility] ENDOSCOPY;  Service: Endoscopy;  Laterality: N/A;  . KNEE ARTHROSCOPY Right 02/21/08  . TOTAL HIP ARTHROPLASTY Right 04/09/2018    Procedure: TOTAL HIP ARTHROPLASTY ANTERIOR APPROACH;  Surgeon: Hessie Knows, MD;  Location: [facility] ORS;  Service: Orthopedics;  Laterality: Right;  . WISDOM TOOTH EXTRACTION       Family History  Problem Relation Age of Onset  . Breast cancer Maternal Aunt        great  . Kidney disease Mother   . Diabetes Mother   . Hypertension Father   . Kidney disease Father   . Diabetes Brother   . Diabetes Paternal Grandmother      Social History   Tobacco Use  . Smoking status: Never Smoker  . Smokeless tobacco: Never Used  Substance Use Topics  . Alcohol use: Yes    Comment: rarely    With staff assistance, above reviewed with the patient today.  ROS:  As per HPI, otherwise no specific complaints on a limited and focused system review   No results found for this or any previous visit (from the past 72 hour(s)).   PHQ2/9: Depression screen [facility] 2/9 03/09/2020 03/04/2020 02/17/2020 12/24/2019 08/28/2019  Decreased Interest 0 0 0 0 0  Down, Depressed, Hopeless 0 0 0 0 0  PHQ - 2 Score 0 0 0 0 0  Altered sleeping - - - 0 0  Tired, decreased energy - - - 0 0  Change in appetite - - - 0 0  Feeling bad or failure about yourself  - - - 0 0  Trouble concentrating - - - 0 0  Moving slowly or fidgety/restless - - - 0 0  Suicidal thoughts - - - 0 0  PHQ-9 Score - - - 0 0  Difficult doing work/chores - - - Not difficult at all Not difficult at all   PHQ-2/9 Result is neg  GAD 7 : Generalized Anxiety Score 02/17/2020  Nervous, Anxious, on Edge 1  Control/stop worrying 1  Worry too much - different things 1  Trouble relaxing 1  Restless 1  Easily annoyed or irritable 0  Afraid - awful might happen 0  Total GAD 7 Score 5  Anxiety Difficulty Not difficult at all   GAD-7 reviewed   Fall Risk: Fall Risk  03/09/2020 03/04/2020 02/17/2020 12/30/2019 12/24/2019  Falls in the past year? 0 0 0 0 0  Number falls in past yr: 0 0 0 - 0  Injury with Fall? - 0 0 - 0  Risk for fall due to : - No  Fall Risks - - -  Follow up - Falls prevention discussed Falls evaluation completed - -      Objective:   Vitals:   03/09/20 0813  BP: 126/80  Pulse: 83  Resp: 16  Temp: 98.3 F (36.8 C)  TempSrc: Oral  SpO2: 99%  Weight: 181 lb 8 oz (82.3 kg)  Height: 5' 2"  (1.575 m)    Body mass index is 33.2 kg/m.  Physical Exam  NAD, masked, pleasant HEENT - [place]/AT, sclera anicteric, PERRL, EOMI, conj - non-inj'ed,  pharynx clear Neck - supple, no adenopathy, no obvious TM,  Car - RRR without m/g/r Pulm- RR and effort normal at rest, CTA without wheeze or rales Abd - soft, NT diffusely,  Back - no CVA tenderness Neuro/psychiatric - affect was not flat, appropriate with conversation             Alert and oriented             The resting tremor of the left upper extremity persists, also noticeable with intention, with it evident on finger-to-nose testing on the left, not on the right.  She had adequate strength of the distal upper extremities, although question of subtle weakness on the left versus the right although not marked, adequate grip bilaterally, pulses adequate distally on the left, good capillary refill             Speech normal  Results for orders placed or performed in visit on 62/70/35  BASIC METABOLIC PANEL WITH GFR  Result Value Ref Range   Glucose, Bld 93 65 - 99 mg/dL   BUN 15 7 - 25 mg/dL   Creat 0.82 0.60 - 0.93 mg/dL   GFR, Est Non African American 72 > OR = 60 mL/min/1.73m   GFR, Est African American 83 > OR = 60 mL/min/1.785m  BUN/Creatinine Ratio NOT APPLICABLE  6 - 22 (calc)   Sodium 143 135 - 146 mmol/L   Potassium 4.9 3.5 - 5.3 mmol/L   Chloride 104 98 - 110 mmol/L   CO2 27 20 - 32 mmol/L   Calcium 10.5 (H) 8.6 - 10.4 mg/dL   Last labs reviewed, with the BMP in July normal, and last thyroid tests about a year ago okay.    Assessment & Plan:    1. Anxiety Seems to be mildly improved with the low-dose Lexapro-5 mg.  She was very hesitant increasing the  Lexapro today due to side effects from the higher dose of the sertraline, and felt best to continue the Lexapro 5 mg daily Continue to monitor.  2. Resting tremor Persists, and has not significantly lessened as her anxiety is a little better presently. Has a family history as noted, and did discuss the potential of this being a benign essential tremor. Also discussed other causes of tremor, including entities like Parkinson's, and did feel best getting neurology's input at this time.  Initially discussed waiting until her follow-up in December, although after further discussion, she felt best to get the neurology opinion, and a referral was provided today.  We will keep the planned follow-up in December, and follow-up sooner as needed.     Towanda Malkin, MD 03/09/20 8:41 AM

## 2020-03-09 ENCOUNTER — Encounter: Payer: Self-pay | Admitting: Internal Medicine

## 2020-03-09 ENCOUNTER — Ambulatory Visit (INDEPENDENT_AMBULATORY_CARE_PROVIDER_SITE_OTHER): Payer: Medicare Other | Admitting: Internal Medicine

## 2020-03-09 ENCOUNTER — Other Ambulatory Visit: Payer: Self-pay

## 2020-03-09 VITALS — BP 126/80 | HR 83 | Temp 98.3°F | Resp 16 | Ht 62.0 in | Wt 181.5 lb

## 2020-03-09 DIAGNOSIS — F419 Anxiety disorder, unspecified: Secondary | ICD-10-CM | POA: Diagnosis not present

## 2020-03-09 DIAGNOSIS — G252 Other specified forms of tremor: Secondary | ICD-10-CM | POA: Diagnosis not present

## 2020-03-09 MED ORDER — ESCITALOPRAM OXALATE 5 MG PO TABS: 5.0000 mg | ORAL_TABLET | Freq: Every day | ORAL | 1 refills | Status: DC

## 2020-03-09 NOTE — Patient Instructions (Signed)
Referral  to neurology provided today

## 2020-03-10 ENCOUNTER — Other Ambulatory Visit: Payer: Self-pay

## 2020-03-10 DIAGNOSIS — K219 Gastro-esophageal reflux disease without esophagitis: Secondary | ICD-10-CM

## 2020-03-10 MED ORDER — OMEPRAZOLE 20 MG PO CPDR: 20.0000 mg | DELAYED_RELEASE_CAPSULE | ORAL | 1 refills | Status: DC

## 2020-03-10 NOTE — Telephone Encounter (Signed)
New pharmacy

## 2020-03-22 ENCOUNTER — Ambulatory Visit
Admission: RE | Admit: 2020-03-22 | Discharge: 2020-03-22 | Disposition: A | Discharge status: Home / Self Care | Payer: Medicare Other | Source: Ambulatory Visit | Attending: Internal Medicine | Admitting: Internal Medicine

## 2020-03-22 ENCOUNTER — Other Ambulatory Visit: Payer: Self-pay

## 2020-03-22 DIAGNOSIS — Z1231 Encounter for screening mammogram for malignant neoplasm of breast: Secondary | ICD-10-CM

## 2020-03-24 ENCOUNTER — Other Ambulatory Visit: Payer: Self-pay | Admitting: Internal Medicine

## 2020-03-24 DIAGNOSIS — N6310 Unspecified lump in the right breast, unspecified quadrant: Secondary | ICD-10-CM

## 2020-03-24 DIAGNOSIS — N6489 Other specified disorders of breast: Secondary | ICD-10-CM

## 2020-03-24 DIAGNOSIS — R928 Other abnormal and inconclusive findings on diagnostic imaging of breast: Secondary | ICD-10-CM

## 2020-03-26 ENCOUNTER — Telehealth: Payer: Self-pay | Admitting: Internal Medicine

## 2020-03-26 NOTE — Telephone Encounter (Signed)
Called patient to inform her that another mammogram and an ultrasound has been ordered. Those are the next steps.

## 2020-03-26 NOTE — Telephone Encounter (Signed)
Patient seeking clinical advice regarding most recent mamo findings and would like to know the next step / plan of action, please advise

## 2020-04-02 ENCOUNTER — Ambulatory Visit
Admission: RE | Admit: 2020-04-02 | Discharge: 2020-04-02 | Disposition: A | Discharge status: Home / Self Care | Payer: Medicare Other | Source: Ambulatory Visit | Attending: Internal Medicine | Admitting: Internal Medicine

## 2020-04-02 ENCOUNTER — Other Ambulatory Visit: Payer: Self-pay

## 2020-04-02 DIAGNOSIS — N6310 Unspecified lump in the right breast, unspecified quadrant: Secondary | ICD-10-CM | POA: Insufficient documentation

## 2020-04-02 DIAGNOSIS — R928 Other abnormal and inconclusive findings on diagnostic imaging of breast: Secondary | ICD-10-CM | POA: Insufficient documentation

## 2020-04-02 DIAGNOSIS — N6489 Other specified disorders of breast: Secondary | ICD-10-CM

## 2020-04-06 ENCOUNTER — Other Ambulatory Visit: Payer: Self-pay | Admitting: Internal Medicine

## 2020-04-06 MED ORDER — AMLODIPINE BESYLATE 10 MG PO TABS
10.0000 mg | ORAL_TABLET | Freq: Every day | ORAL | 3 refills | Status: DC
Start: 2020-04-06 — End: 2021-02-14

## 2020-04-08 DIAGNOSIS — R251 Tremor, unspecified: Secondary | ICD-10-CM | POA: Insufficient documentation

## 2020-04-16 IMAGING — MG MM DIGITAL SCREENING BILAT W/ TOMO W/ CAD
6 of 10 series · 6 of 30 positions shown · non-contrast
Comparison: Previous exam(s).

CLINICAL DATA: Screening.

EXAM:
DIGITAL SCREENING BILATERAL MAMMOGRAM WITH TOMO AND CAD

[R MLO synth-2D (1 of 2)]
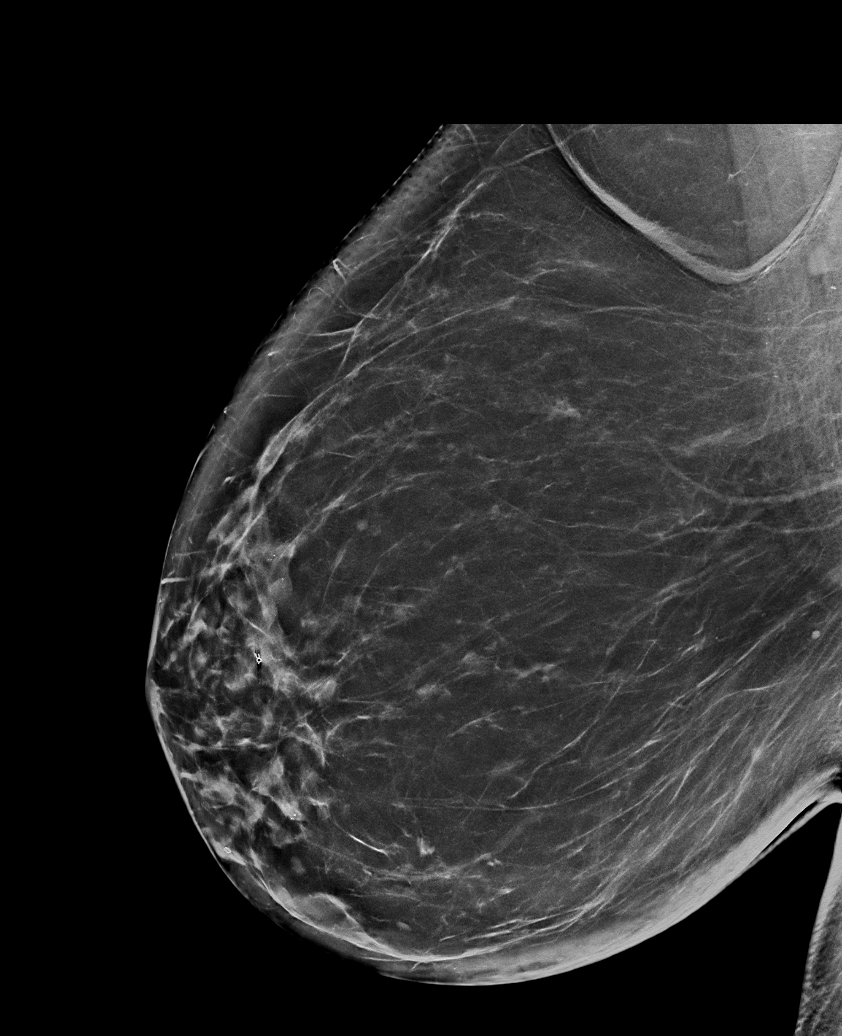

[R CC synth-2D]
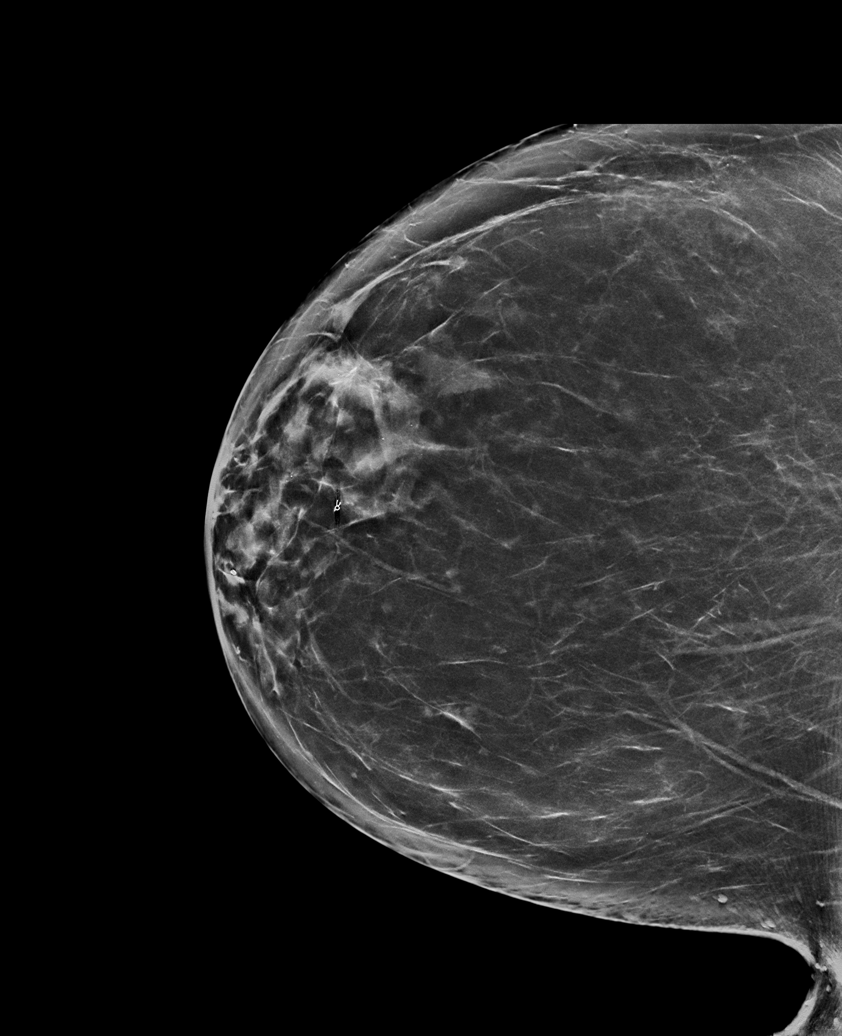

[L CC synth-2D]
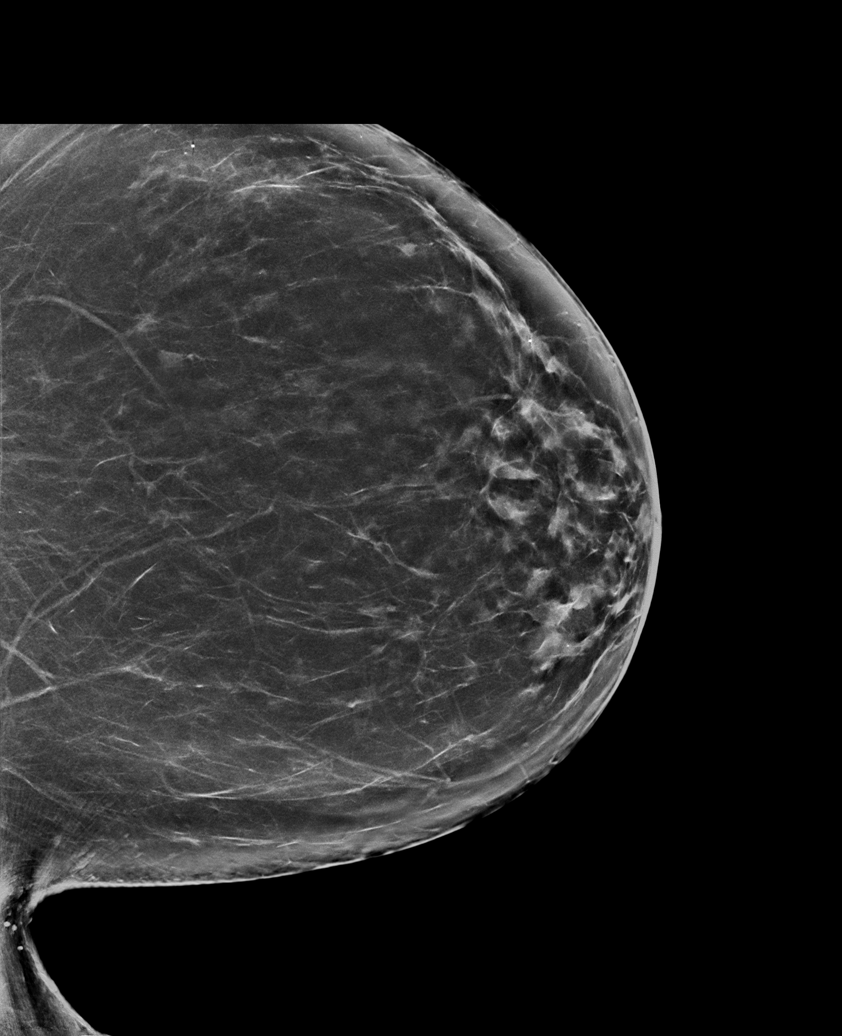

[L MLO synth-2D]
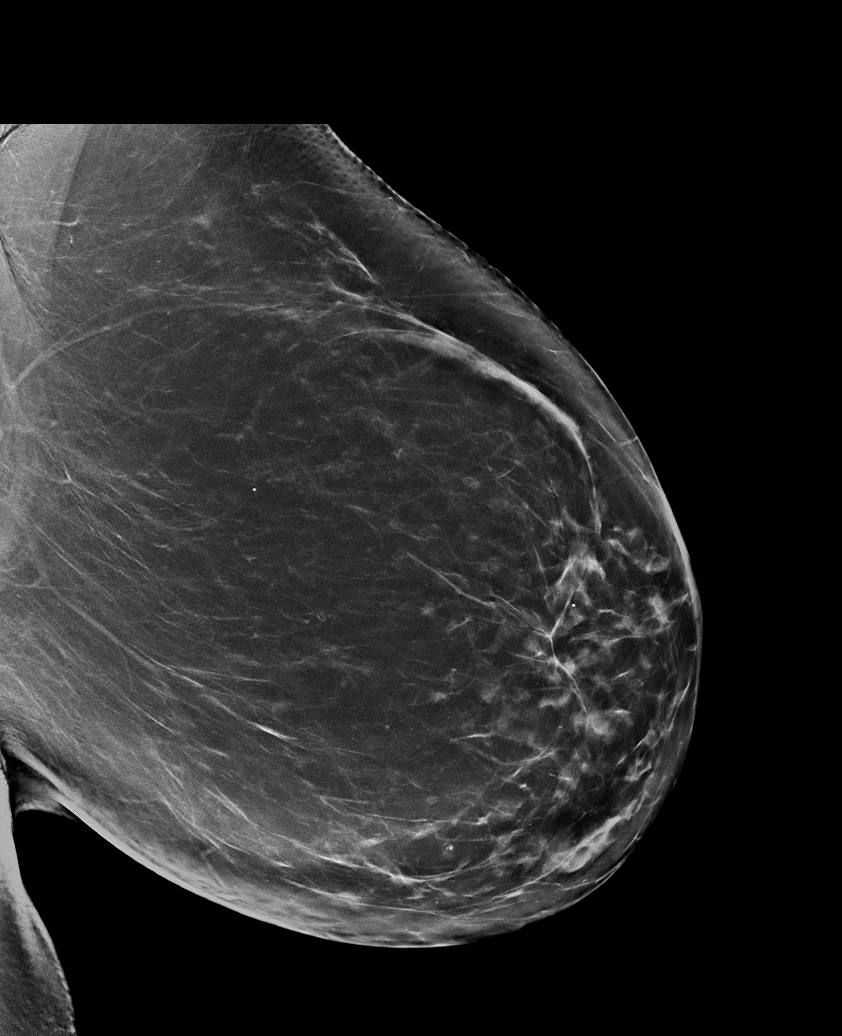

[R MLO synth-2D (2 of 2)]
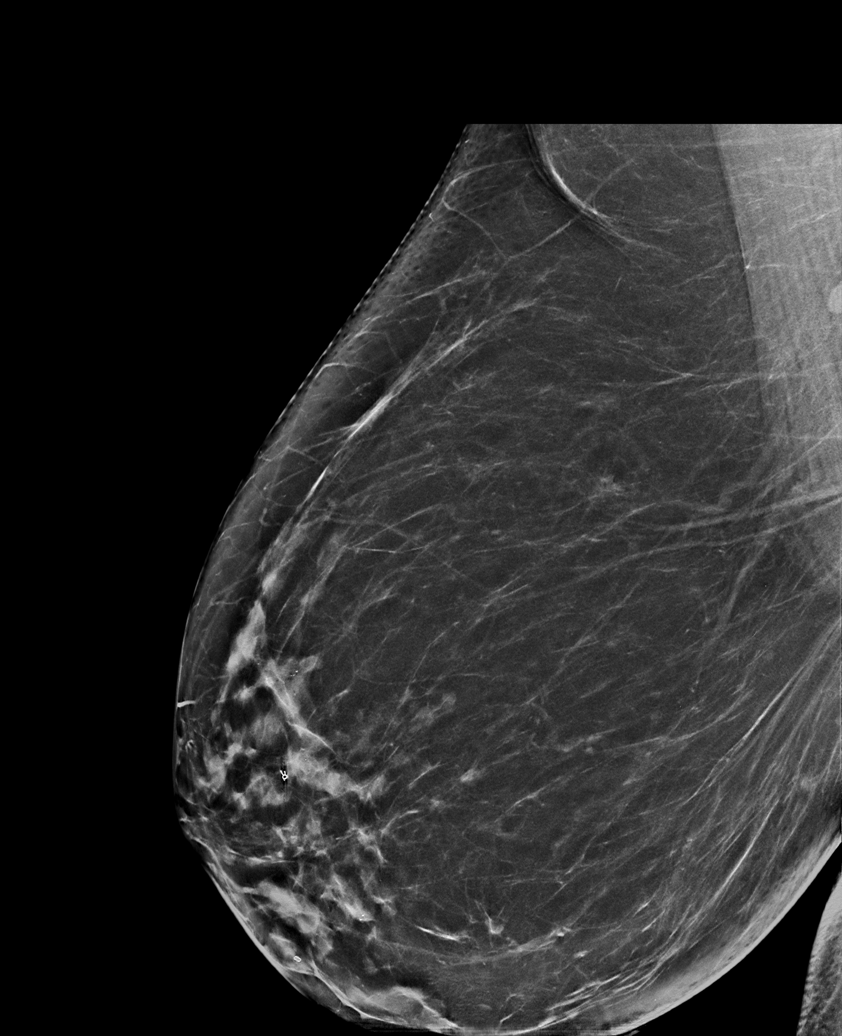

[R MLO tomo · tomo slice 42/83.0]
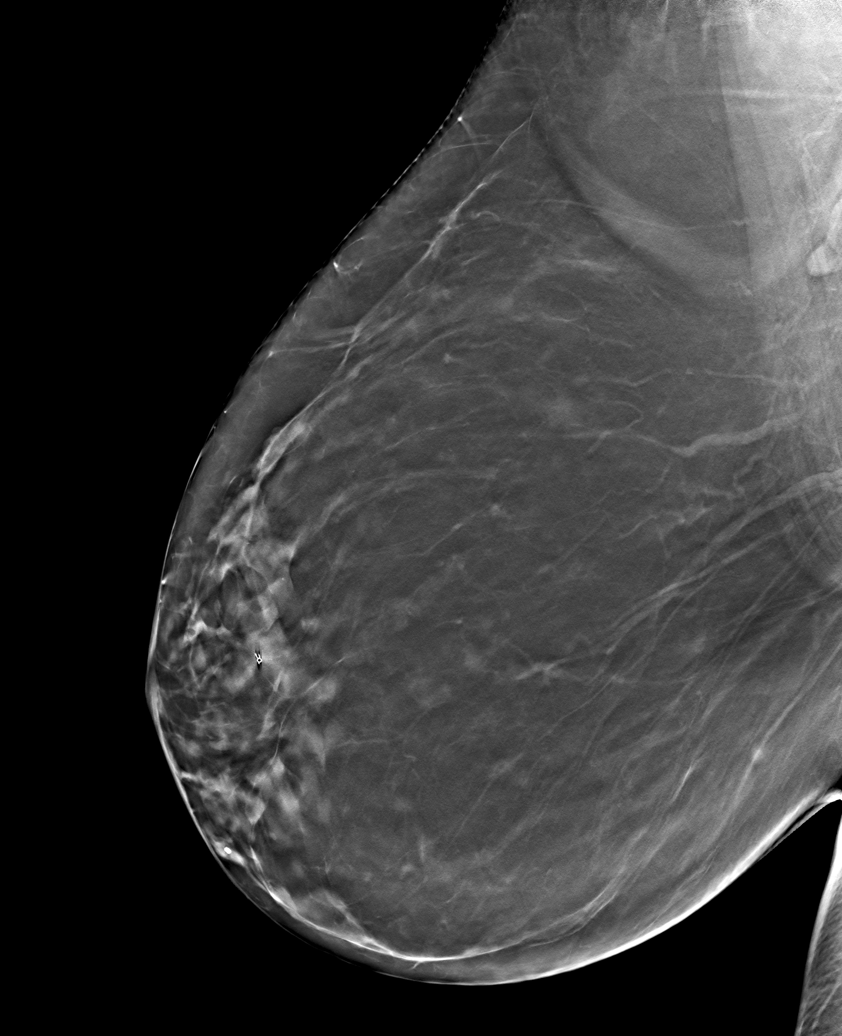

[6 of 30 positions shown; findings below may reference images not displayed]

ACR Breast Density Category b: There are scattered areas of
fibroglandular density.
FINDINGS: There are no findings suspicious for malignancy. Images were
processed with CAD.
IMPRESSION: No mammographic evidence of malignancy. A result letter of this
screening mammogram will be mailed directly to the patient.

RECOMMENDATION:
Screening mammogram in one year. (Code:[id number])

BI-RADS CATEGORY  1: Negative.

## 2020-05-20 NOTE — Progress Notes (Signed)
Patient ID: Patricia Richardson, female    DOB: 1951/04/02, 69 y.o.   MRN: [id number]  PCP: Towanda Malkin, MD  Chief Complaint  Patient presents with  . Follow-up    Subjective:   Patricia Richardson is a 69 y.o. female, presents to clinic with CC of the following:  Chief Complaint  Patient presents with  . Follow-up    HPI:  Patient is a 69 year old female  Follows up today after after our July 21 visit All in all doing "pretty good"  She was seen a couple times since that visit for anxiety and left hand tremor concerns. Taking sinemet 3X/ day by Dr. Melrose Nakayama, neurology and has been helpful, f/u again in early Jan  At our last visit 03/09/2020, she noted the anxiety seems to be a little better controlled, and she was tolerating the Lexapro product without side effects, although has not been taking in the recent past as was stopped by the neurologist and she has been oing well.   She notes anxiety has not increased at all in the recent past.  Her mammogram in October had some concerns of the right breast, and follow-up of the right breast, and follow-up mammogram and ultrasound revealed no findings concerning for malignancy in the right breast, with a follow-up recommended again in 1 year  HTN/LE swelling Medication regimen-amlodipine-10 mg daily, hydrochlorothiazide-25 mg daily She takes her medicines regularly She does check her blood pressures at home and they have been running 115-130/70-80's.  BP Readings from Last 3 Encounters:  05/25/20 120/80  03/09/20 126/80  03/04/20 122/82     She denies any recent chest pains, palpitations, shortness of breath,   Concerned previously that after amlodipine was initiated, may be a source to some increased lower extremity swelling.  Swelling was more at the end of the day, not much in the morning and was having no pain in the lower extremities nor any numbness or tingling or weakness in the lower extremities.  She notes that the  swelling has remained very well controlled, and denies having any major swelling concerns in the recent past.   HLD:She has history of stroke in 2003 without residual effects.  Medication regimen-atorvastatin 10mg  twice weekly and asiprin 81mg  without any issues.   Goal is LDL less than 70 Lab Results  Component Value Date   CHOL 144 04/10/2019   HDL 57 04/10/2019   LDLCALC 69 04/10/2019   TRIG 98 04/10/2019   CHOLHDL 2.5 04/10/2019    She noted an occasional intermittent tinnitus in the ear at times, no marked dizziness in association, and only takes a low-dose baby aspirin daily.  Do not feel it is related to aspirin use.  GERD: Medication regimen-omeprazole every other day.  Denies chronic abdominal pain, dark or black stools,   History prediabetes/Obesity: Lab Results  Component Value Date   HGBA1C 5.5 04/10/2019   HGBA1C 5.5 11/04/2018   HGBA1C 5.8 (H) 06/17/2018   Lab Results  Component Value Date   LDLCALC 69 04/10/2019   CREATININE 0.82 12/24/2019     Wt Readings from Last 3 Encounters:  05/25/20 177 lb 4.8 oz (80.4 kg)  03/09/20 181 lb 8 oz (82.3 kg)  03/04/20 181 lb 3.2 oz (82.2 kg)     She has extensive family history of DM.  Weights have been fairly stable in the recent past, and has lost some weight in the more recent past. Trying to not eat at night  has  been helpful Denied polydipsia, or numbness or tingling in her extremities  Multiple Thyroid Nodules: Saw Dr. Celine Ahr for Biopsy in June 2020 which was benign; recommended 1 year [place] repeat for surveillance.Not planning to f/u. She thinks is ok.   Rec'ed previously she f/u as I pulled up the last visit with Dr. Celine Ahr on epic, and she noted surveillance ultrasounds are recommended for up to 5 years after the procedure was done.  The patient did agree to follow-up and have the ultrasound for surveillance previously, and reminded her of that today.     Patient Active Problem List    Diagnosis Date Noted  . Mixed hyperlipidemia 08/28/2019  . Dependent edema 08/28/2019  . Leg pain 07/15/2019  . DJD (degenerative joint disease) 07/15/2019  . Gastroesophageal reflux disease without esophagitis 04/01/2019  . Multiple thyroid nodules 04/01/2019  . 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 Current use of proton pump inhibitor 09/23/2018  . Prediabetes 06/20/2018  . At risk for heart disease 06/20/2018  . Essential hypertension 06/17/2018  . Hx of completed stroke 06/17/2018  . Obesity (BMI 35.0-39.9 without comorbidity) 06/17/2018  . 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 Primary osteoarthritis of right hip 03/12/2017  . Dyspnea on exertion 03/01/2015  . Chronic insomnia 09/28/2014  . Anxiety 07/29/2014  . Bleeding hemorrhoids 07/29/2014      Current Outpatient Medications:  .  acetaminophen (TYLENOL) 500 MG tablet, Take 1-2 tablets (500-1,000 mg total) by mouth every 6 (six) hours as needed for mild pain (pain score 1-3 or temp > 100.5)., Disp: , Rfl:  .  amLODipine (NORVASC) 10 MG tablet, Take 1 tablet (10 mg total) by mouth daily., Disp: 90 tablet, Rfl: 3 .  aspirin EC 81 MG tablet, Take 1 tablet (81 mg total) by mouth daily., Disp: 90 tablet, Rfl: 3 .  atorvastatin (LIPITOR) 10 MG tablet, TAKE 1 TABLET BY MOUTH AT BEDTIME 2 NIGHTS PER WEEK, Disp: 26 tablet, Rfl: 3 .  carbidopa-levodopa (SINEMET IR) 25-100 MG tablet, Take by mouth., Disp: , Rfl:  .  hydrochlorothiazide (HYDRODIURIL) 25 MG tablet, Take 1 tablet (25 mg total) by mouth daily., Disp: 90 tablet, Rfl: 3 .  latanoprost (XALATAN) 0.005 % ophthalmic solution, Place 1 drop into both eyes at bedtime. , Disp: , Rfl:  .  omeprazole (PRILOSEC) 20 MG capsule, Take 1 capsule (20 mg total) by mouth every other day., Disp: 45 capsule, Rfl: 1 .  OVER THE COUNTER MEDICATION, Take 1 tablet by mouth daily as needed (constipation). Swiss Kriss herbal laxative, Disp: , Rfl:    Allergies  Allergen Reactions  .  Codeine Itching    Pin and needles      Past Surgical History:  Procedure Laterality Date  . ABDOMINAL HYSTERECTOMY    . BREAST BIOPSY Right 11/03/2005   core biopsy w/ clip placement  . CHOLECYSTECTOMY    . COLONOSCOPY    . COLONOSCOPY WITH PROPOFOL N/A 11/05/2015   Procedure: COLONOSCOPY WITH PROPOFOL;  Surgeon: Lollie Sails, MD;  Location[facility] ENDOSCOPY;  Service: Endoscopy;  Laterality: N/A;  . KNEE ARTHROSCOPY Right 02/21/08  . TOTAL HIP ARTHROPLASTY Right 04/09/2018   Procedure: TOTAL HIP ARTHROPLASTY ANTERIOR APPROACH;  Surgeon: Hessie Knows, MD;  Location: [facility] ORS;  Service: Orthopedics;  Laterality: Right;  . WISDOM TOOTH EXTRACTION       Family History  Problem Relation Age of Onset  . Breast cancer Maternal Aunt        great  . Kidney disease  Mother   . Diabetes Mother   . Hypertension Father   . Kidney disease Father   . Diabetes Brother   . Diabetes Paternal Grandmother      Social History   Tobacco Use  . Smoking status: Never Smoker  . Smokeless tobacco: Never Used  Substance Use Topics  . Alcohol use: Yes    Comment: rarely    With staff assistance, above reviewed with the patient today.  ROS: As per HPI, otherwise no specific complaints on a limited and focused system review   No results found for this or any previous visit (from the past 72 hour(s)).   PHQ2/9: Depression screen [facility] 2/9 05/25/2020 03/09/2020 03/04/2020 02/17/2020 12/24/2019  Decreased Interest 0 0 0 0 0  Down, Depressed, Hopeless 0 0 0 0 0  PHQ - 2 Score 0 0 0 0 0  Altered sleeping 0 - - - 0  Tired, decreased energy 0 - - - 0  Change in appetite 0 - - - 0  Feeling bad or failure about yourself  0 - - - 0  Trouble concentrating 0 - - - 0  Moving slowly or fidgety/restless 0 - - - 0  Suicidal thoughts 0 - - - 0  PHQ-9 Score - - - - 0  Difficult doing work/chores - - - - Not difficult at all   PHQ-2/9 Result is neg   Fall Risk: Fall Risk  05/25/2020 03/09/2020 03/04/2020  02/17/2020 12/30/2019  Falls in the past year? 0 0 0 0 0  Number falls in past yr: 0 0 0 0 -  Injury with Fall? 0 - 0 0 -  Risk for fall due to : - - No Fall Risks - -  Follow up - - Falls prevention discussed Falls evaluation completed -      Objective:   Vitals:   05/25/20 0843  BP: 120/80  Pulse: 77  Resp: 16  Temp: 98.1 F (36.7 C)  TempSrc: Oral  SpO2: 98%  Weight: 177 lb 4.8 oz (80.4 kg)  Height: 5\' 2"  (1.575 m)    Body mass index is 32.43 kg/m.  Physical Exam   NAD, masked, pleasant HEENT - /AT, sclera anicteric, PERRL, EOMI without nystagmus, conj - non-inj'ed, TMs and canals clear, pharynx clear Neck - supple, no adenopathy, no marked TM,  carotids 2+ and = without bruits bilat Car - RRR without m/g/r Pulm- RR and effort normal at rest, CTA without wheeze or rales Abd -soft, nontender diffusely, mildly obese,  Back - no CVA tenderness Ext -no lower extremity edema, Neuro/psychiatric - affect was not flat, appropriate with conversation Alert with normal speech Did feel the resting tremor was much less noticeable today than previous.  Results for orders placed or performed in visit on 16/10/96  BASIC METABOLIC PANEL WITH GFR  Result Value Ref Range   Glucose, Bld 93 65 - 99 mg/dL   BUN 15 7 - 25 mg/dL   Creat 0.82 0.60 - 0.93 mg/dL   GFR, Est Non African American 72 > OR = 60 mL/min/1.20m2   GFR, Est African American 83 > OR = 60 mL/min/1.58m2   BUN/Creatinine Ratio NOT APPLICABLE 6 - 22 (calc)   Sodium 143 135 - 146 mmol/L   Potassium 4.9 3.5 - 5.3 mmol/L   Chloride 104 98 - 110 mmol/L   CO2 27 20 - 32 mmol/L   Calcium 10.5 (H) 8.6 - 10.4 mg/dL   Prior labs reviewed.    Assessment &  Plan:    1. Essential hypertension Her blood pressure remains controlled with the amlodipine, and previously did note that sometimes this medicine can cause lower extremity swelling, although she liked how it has controlled her blood pressure very  well, and she has not had any concerns with lower extremity swelling in the more recent past will continue the amlodipine presently as well as the low-dose hydrochlorothiazide product. Check labs ordered. Continue taking the medicines in the morning.   2. LE edema - likely dependent component Remains improved.   Continue to monitor   3. Obesity (BMI 35.0-39.9 without comorbidity) Continue to monitor  4. Mixed hyperlipidemia Continue the statin product presently.  Continue taking it in the evening. Check lipid panel today  5. Resting tremor Has seen neurology, and she notes the Sinemet started by them has been helpful for her tremor. There was a question of possibly Parkinson's versus essential tremor noted in their assessment/plan. Continue to follow with neurology presently.  6. Multiple thyroid nodules Did feel like a follow-up ultrasound would be helpful, and she agreed to follow-up. The # for Dr. Amado Coe office was provided to her to help call and schedule a follow-up last visit, and has not yet followed up with that that. Check labs today  7. Hx of completed stroke She inquired about taking the statin if her cholesterol is good previously, and discussed the goal of having the LDL less than 70, and also how a statin is helpful in more ways than just lowering her cholesterol number. Felt important to continue the statin, and to continue.  8. Prediabetes hx will check an A1c today with the labs Last A1c was in the normal range  9. GERD Stable on omeprazole every other day use Aware of the concerns of chronic PPI use, and the importance of reflux precautions to help manage emphasized in hoping to further lessen chronic PPI use over time Check CBC with labs ordered  10. Anxiety Has done well even after that Lexapro has been stopped by neurology.. Continue to monitor.   Await lab results from today. Recommended a follow-up in 6 months time, sooner as needed, and  she is aware that follow-up will be with a new provider as I will be leaving this practice prior to that planned follow-up.   Towanda Malkin, MD 05/25/20 8:58 AM

## 2020-05-25 ENCOUNTER — Encounter: Payer: Self-pay | Admitting: Internal Medicine

## 2020-05-25 ENCOUNTER — Other Ambulatory Visit: Payer: Self-pay

## 2020-05-25 ENCOUNTER — Ambulatory Visit (INDEPENDENT_AMBULATORY_CARE_PROVIDER_SITE_OTHER): Payer: Medicare Other | Admitting: Internal Medicine

## 2020-05-25 VITALS — BP 120/80 | HR 77 | Temp 98.1°F | Resp 16 | Ht 62.0 in | Wt 177.3 lb

## 2020-05-25 DIAGNOSIS — E782 Mixed hyperlipidemia: Secondary | ICD-10-CM

## 2020-05-25 DIAGNOSIS — R609 Edema, unspecified: Secondary | ICD-10-CM | POA: Diagnosis not present

## 2020-05-25 DIAGNOSIS — E042 Nontoxic multinodular goiter: Secondary | ICD-10-CM

## 2020-05-25 DIAGNOSIS — F419 Anxiety disorder, unspecified: Secondary | ICD-10-CM

## 2020-05-25 DIAGNOSIS — E669 Obesity, unspecified: Secondary | ICD-10-CM | POA: Diagnosis not present

## 2020-05-25 DIAGNOSIS — K219 Gastro-esophageal reflux disease without esophagitis: Secondary | ICD-10-CM

## 2020-05-25 DIAGNOSIS — R7303 Prediabetes: Secondary | ICD-10-CM

## 2020-05-25 DIAGNOSIS — I10 Essential (primary) hypertension: Secondary | ICD-10-CM

## 2020-05-25 DIAGNOSIS — Z8673 Personal history of transient ischemic attack (TIA), and cerebral infarction without residual deficits: Secondary | ICD-10-CM

## 2020-05-25 DIAGNOSIS — G252 Other specified forms of tremor: Secondary | ICD-10-CM

## 2020-05-26 LAB — CBC WITH DIFFERENTIAL/PLATELET
Absolute Monocytes: 459 cells/uL (ref 200–950)
Basophils Absolute: 34 cells/uL (ref 0–200)
Basophils Relative: 0.4 %
Eosinophils Absolute: 102 cells/uL (ref 15–500)
Eosinophils Relative: 1.2 %
HCT: 46.5 % — ABNORMAL HIGH (ref 35.0–45.0)
Hemoglobin: 15.7 g/dL — ABNORMAL HIGH (ref 11.7–15.5)
Lymphs Abs: 1496 cells/uL (ref 850–3900)
MCH: 29.4 pg (ref 27.0–33.0)
MCHC: 33.8 g/dL (ref 32.0–36.0)
MCV: 87.1 fL (ref 80.0–100.0)
MPV: 10.5 fL (ref 7.5–12.5)
Monocytes Relative: 5.4 %
Neutro Abs: 6409 cells/uL (ref 1500–7800)
Neutrophils Relative %: 75.4 %
Platelets: 404 10*3/uL — ABNORMAL HIGH (ref 140–400)
RBC: 5.34 10*6/uL — ABNORMAL HIGH (ref 3.80–5.10)
RDW: 12.9 % (ref 11.0–15.0)
Total Lymphocyte: 17.6 %
WBC: 8.5 10*3/uL (ref 3.8–10.8)

## 2020-05-26 LAB — THYROID PANEL WITH TSH
Free Thyroxine Index: 2.5 (ref 1.4–3.8)
T3 Uptake: 31 % (ref 22–35)
T4, Total: 8.1 ug/dL (ref 5.1–11.9)
TSH: 0.11 mIU/L — ABNORMAL LOW (ref 0.40–4.50)

## 2020-05-26 LAB — COMPLETE METABOLIC PANEL WITH GFR
AG Ratio: 1.6 (calc) (ref 1.0–2.5)
ALT: 6 U/L (ref 6–29)
AST: 11 U/L (ref 10–35)
Albumin: 4.5 g/dL (ref 3.6–5.1)
Alkaline phosphatase (APISO): 72 U/L (ref 37–153)
BUN: 15 mg/dL (ref 7–25)
CO2: 29 mmol/L (ref 20–32)
Calcium: 10.1 mg/dL (ref 8.6–10.4)
Chloride: 102 mmol/L (ref 98–110)
Creat: 0.66 mg/dL (ref 0.60–0.93)
GFR, Est African American: 103 mL/min/{1.73_m2} (ref >=60)
GFR, Est Non African American: 89 mL/min/{1.73_m2} (ref >=60)
Globulin: 2.9 g/dL (calc) (ref 1.9–3.7)
Glucose, Bld: 98 mg/dL (ref 65–99)
Potassium: 3.3 mmol/L — ABNORMAL LOW (ref 3.5–5.3)
Sodium: 141 mmol/L (ref 135–146)
Total Bilirubin: 0.6 mg/dL (ref 0.2–1.2)
Total Protein: 7.4 g/dL (ref 6.1–8.1)

## 2020-05-26 LAB — HEMOGLOBIN A1C
Hgb A1c MFr Bld: 6 % of total Hgb — ABNORMAL HIGH (ref <5.7)
Mean Plasma Glucose: 126 mg/dL
eAG (mmol/L): 7 mmol/L

## 2020-05-26 LAB — LIPID PANEL
Cholesterol: 149 mg/dL (ref <200)
HDL: 55 mg/dL (ref >=50)
LDL Cholesterol (Calc): 75 mg/dL (calc)
Non-HDL Cholesterol (Calc): 94 mg/dL (calc) (ref <130)
Total CHOL/HDL Ratio: 2.7 (calc) (ref <5.0)
Triglycerides: 107 mg/dL (ref <150)

## 2020-06-08 DIAGNOSIS — Z8659 Personal history of other mental and behavioral disorders: Secondary | ICD-10-CM | POA: Insufficient documentation

## 2020-06-08 DIAGNOSIS — R262 Difficulty in walking, not elsewhere classified: Secondary | ICD-10-CM | POA: Insufficient documentation

## 2020-06-09 NOTE — Progress Notes (Signed)
Patient ID: Patricia Richardson, female    DOB: Oct 06, 1950, 70 y.o.   MRN: [id number]  PCP: Towanda Malkin, MD  Chief Complaint  Patient presents with  . Follow-up    Subjective:   Patricia Richardson is a 70 y.o. female, presents to clinic with CC of the following:  Chief Complaint  Patient presents with  . Follow-up    HPI:  Patient is a 70 year old female Last visit was 05/25/2020 with that note reviewed. Communication with labs after that follow-up visit was as follows:  The lipid panel remains very good and all normal. The complete metabolic panel was also all normal except for a slightly low potassium at 3.3.  The A1C was a little higher and in the prediabetic range at 6.0 The thyroid panel showed a low TSH at 0.11 with the other components normal.A low TSH can indicate low thyroid levels. Noting your history of thyroid nodules, I do think contacting Dr. Amado Coe office and scheduling a f/u as we discussed yesterday is appropriate. The complete blood count had a slightly higher hemoglobin and hematocrit, as well as the platelet count. I think the best next step is to repeat the labs in early January and have a follow up visit as well to review further. I have asked Crystal to help arrange that follow up visit.  Follows up today  In general, she notes she has been feeling okay, states tired often noted.  She saw neurology for follow-up a couple days ago, following for her tremor, and possible Parkinson's versus essential tremor noted previously with the following recommendations made from this most recent visit:  (more likely Parkinsons per patient from last visit. Has f/u again in April) - Continue taking Sinemet 25/100 1 tab 3 times per day, refilled.  - Start taking Gabapentin 100 mg twice daily for one week, then increase to 200 mg twice daily  Polycythemia Mild increased H&H and platelet count noted on last check. Felt best to recheck labs on his follow-up. Briefly  reviewed polycythemia with her today.  She has no tobacco history. Lab Results  Component Value Date   WBC 8.5 05/25/2020   HGB 15.7 (H) 05/25/2020   HCT 46.5 (H) 05/25/2020   MCV 87.1 05/25/2020   PLT 404 (H) 05/25/2020   hypokalemia Mild decrease in potassium noted on last lab draw Lab Results  Component Value Date   CREATININE 0.66 05/25/2020   BUN 15 05/25/2020   NA 141 05/25/2020   K 3.3 (L) 05/25/2020   CL 102 05/25/2020   CO2 29 05/25/2020   History of thyroid nodules/low TSH on last lab draw noted (the remainder of the thyroid panel was normal) Lab Results  Component Value Date   TSH 0.11 (L) 05/25/2020   After last visit,I did feel like a follow-up ultrasound would be helpful, and she agreed to follow-up. The #forDr. Celine Ahr office was provided to her previously to help call and schedule a follow-up and has not yet called her followed up with that that. Discussed seeing endocrine here in [facility], and she agreed to do so, and a referral was placed today  Essential hypertension Her blood pressure remains controlled with the amlodipine, and previously did note that sometimes this medicine can cause lower extremity swelling, although she liked how it has controlled her blood pressure very well, and she has not had any concerns with lower extremity swelling in the more recent past and the amlodipine was continued.  Also on low-dose hydrochlorothiazide BP Readings from Last 3 Encounters:  06/10/20 130/90  05/25/20 120/80  03/09/20 126/80   Denies recent chest pains, palpitations, or shortness of breath.  Obesity Wt Readings from Last 3 Encounters:  06/10/20 177 lb 12.8 oz (80.6 kg)  05/25/20 177 lb 4.8 oz (80.4 kg)  03/09/20 181 lb 8 oz (82.3 kg)   Weights have remained stable  Hx of completed stroke She inquired about taking the statin if her cholesterol is good previously, and discussed the goal of having the LDL less than 70, and also how a statin is  helpful in more ways than just lowering her cholesterol number. Felt important to continue the statin, and she has continued taking.   Patient Active Problem List   Diagnosis Date Noted  . Mixed hyperlipidemia 08/28/2019  . Dependent edema 08/28/2019  . Leg pain 07/15/2019  . DJD (degenerative joint disease) 07/15/2019  . Gastroesophageal reflux disease without esophagitis 04/01/2019  . Multiple thyroid nodules 04/01/2019  . 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 Current use of proton pump inhibitor 09/23/2018  . Prediabetes 06/20/2018  . At risk for heart disease 06/20/2018  . Essential hypertension 06/17/2018  . Hx of completed stroke 06/17/2018  . Obesity (BMI 35.0-39.9 without comorbidity) 06/17/2018  . 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 Primary osteoarthritis of right hip 03/12/2017  . Dyspnea on exertion 03/01/2015  . Chronic insomnia 09/28/2014  . Anxiety 07/29/2014  . Bleeding hemorrhoids 07/29/2014      Current Outpatient Medications:  .  acetaminophen (TYLENOL) 500 MG tablet, Take 1-2 tablets (500-1,000 mg total) by mouth every 6 (six) hours as needed for mild pain (pain score 1-3 or temp > 100.5)., Disp: , Rfl:  .  amLODipine (NORVASC) 10 MG tablet, Take 1 tablet (10 mg total) by mouth daily., Disp: 90 tablet, Rfl: 3 .  aspirin EC 81 MG tablet, Take 1 tablet (81 mg total) by mouth daily., Disp: 90 tablet, Rfl: 3 .  atorvastatin (LIPITOR) 10 MG tablet, TAKE 1 TABLET BY MOUTH AT BEDTIME 2 NIGHTS PER WEEK, Disp: 26 tablet, Rfl: 3 .  carbidopa-levodopa (SINEMET IR) 25-100 MG tablet, Take by mouth., Disp: , Rfl:  .  gabapentin (NEURONTIN) 100 MG capsule, Take by mouth., Disp: , Rfl:  .  hydrochlorothiazide (HYDRODIURIL) 25 MG tablet, Take 1 tablet (25 mg total) by mouth daily., Disp: 90 tablet, Rfl: 3 .  latanoprost (XALATAN) 0.005 % ophthalmic solution, Place 1 drop into both eyes at bedtime. , Disp: , Rfl:  .  omeprazole (PRILOSEC) 20 MG capsule, Take 1  capsule (20 mg total) by mouth every other day., Disp: 45 capsule, Rfl: 1 .  OVER THE COUNTER MEDICATION, Take 1 tablet by mouth daily as needed (constipation). Swiss Kriss herbal laxative, Disp: , Rfl:    Allergies  Allergen Reactions  . Codeine Itching    Pin and needles      Past Surgical History:  Procedure Laterality Date  . ABDOMINAL HYSTERECTOMY    . BREAST BIOPSY Right 11/03/2005   core biopsy w/ clip placement  . CHOLECYSTECTOMY    . COLONOSCOPY    . COLONOSCOPY WITH PROPOFOL N/A 11/05/2015   Procedure: COLONOSCOPY WITH PROPOFOL;  Surgeon: Lollie Sails, MD;  Location: [facility] ENDOSCOPY;  Service: Endoscopy;  Laterality: N/A;  . KNEE ARTHROSCOPY Right 02/21/08  . TOTAL HIP ARTHROPLASTY Right 04/09/2018   Procedure: TOTAL HIP ARTHROPLASTY ANTERIOR APPROACH;  Surgeon: Hessie Knows, MD;  Location: [facility] ORS;  Service: Orthopedics;  Laterality: Right;  . WISDOM TOOTH EXTRACTION       Family History  Problem Relation Age of Onset  . Breast cancer Maternal Aunt        great  . Kidney disease Mother   . Diabetes Mother   . Hypertension Father   . Kidney disease Father   . Diabetes Brother   . Diabetes Paternal Grandmother      Social History   Tobacco Use  . Smoking status: Never Smoker  . Smokeless tobacco: Never Used  Substance Use Topics  . Alcohol use: Yes    Comment: rarely    With staff assistance, above reviewed with the patient today.  ROS: As per HPI, otherwise no specific complaints on a limited and focused system review   No results found for this or any previous visit (from the past 72 hour(s)).   PHQ2/9: Depression screen [facility] 2/9 06/10/2020 05/25/2020 03/09/2020 03/04/2020 02/17/2020  Decreased Interest 0 0 0 0 0  Down, Depressed, Hopeless 0 0 0 0 0  PHQ - 2 Score 0 0 0 0 0  Altered sleeping - 0 - - -  Tired, decreased energy - 0 - - -  Change in appetite - 0 - - -  Feeling bad or failure about yourself  - 0 - - -  Trouble concentrating - 0 - - -   Moving slowly or fidgety/restless - 0 - - -  Suicidal thoughts - 0 - - -  PHQ-9 Score - - - - -  Difficult doing work/chores - - - - -   PHQ-2/9 Result is neg  Fall Risk: Fall Risk  06/10/2020 05/25/2020 03/09/2020 03/04/2020 02/17/2020  Falls in the past year? - 0 0 0 0  Number falls in past yr: 0 0 0 0 0  Injury with Fall? 0 0 - 0 0  Risk for fall due to : - - - No Fall Risks -  Follow up - - - Falls prevention discussed Falls evaluation completed      Objective:   Vitals:   06/10/20 0958  BP: 130/90  Pulse: (!) 106  Resp: 16  Temp: 98.4 F (36.9 C)  TempSrc: Oral  SpO2: 98%  Weight: 177 lb 12.8 oz (80.6 kg)  Height: 5\' 2"  (1.575 m)    Body mass index is 32.52 kg/m.  Physical Exam   NAD, masked, pleasant HEENT - [place]/AT, sclera anicteric, PERRL, positive glasses, EOMI without nystagmus, conj - non-inj'ed, pharynx clear Neck - supple, no adenopathy, no marked TM without any obvious nodules palpated,  carotids 2+ and = without bruits bilat Car - RRR without m/g/r, not tachycardic on my exam with her pulse approximately 92 and regular Pulm- RR and effort normal at rest, CTA without wheeze or rales Abd -soft, nontenderdiffusely, mildly obese,  Back - no CVA tenderness Ext -no lower extremity edema, Neuro/psychiatric - affect was not flat, appropriate with conversation Alert with normal speech Resting tremor evident predominantly on the left upper extremity, question a subtle component on the right.  Had good grip strength, sensation intact to light touch in the distal upper extremities and hands,   Results for orders placed or performed in visit on 05/25/20  Lipid panel  Result Value Ref Range   Cholesterol 149 <200 mg/dL   HDL 55 > OR = 50 mg/dL   Triglycerides 05/27/20 564 mg/dL   LDL Cholesterol (Calc) 75 mg/dL (calc)   Total CHOL/HDL Ratio 2.7 <5.0 (calc)   Non-HDL Cholesterol (Calc)  94 <130 mg/dL (calc)  COMPLETE METABOLIC PANEL WITH GFR   Result Value Ref Range   Glucose, Bld 98 65 - 99 mg/dL   BUN 15 7 - 25 mg/dL   Creat 0.66 0.60 - 0.93 mg/dL   GFR, Est Non African American 89 > OR = 60 mL/min/1.68m2   GFR, Est African American 103 > OR = 60 mL/min/1.22m2   BUN/Creatinine Ratio NOT APPLICABLE 6 - 22 (calc)   Sodium 141 135 - 146 mmol/L   Potassium 3.3 (L) 3.5 - 5.3 mmol/L   Chloride 102 98 - 110 mmol/L   CO2 29 20 - 32 mmol/L   Calcium 10.1 8.6 - 10.4 mg/dL   Total Protein 7.4 6.1 - 8.1 g/dL   Albumin 4.5 3.6 - 5.1 g/dL   Globulin 2.9 1.9 - 3.7 g/dL (calc)   AG Ratio 1.6 1.0 - 2.5 (calc)   Total Bilirubin 0.6 0.2 - 1.2 mg/dL   Alkaline phosphatase (APISO) 72 37 - 153 U/L   AST 11 10 - 35 U/L   ALT 6 6 - 29 U/L  Hemoglobin A1c  Result Value Ref Range   Hgb A1c MFr Bld 6.0 (H) <5.7 % of total Hgb   Mean Plasma Glucose 126 mg/dL   eAG (mmol/L) 7.0 mmol/L  Thyroid Panel With TSH  Result Value Ref Range   T3 Uptake 31 22 - 35 %   T4, Total 8.1 5.1 - 11.9 mcg/dL   Free Thyroxine Index 2.5 1.4 - 3.8   TSH 0.11 (L) 0.40 - 4.50 mIU/L  CBC with Differential/Platelet  Result Value Ref Range   WBC 8.5 3.8 - 10.8 Thousand/uL   RBC 5.34 (H) 3.80 - 5.10 Million/uL   Hemoglobin 15.7 (H) 11.7 - 15.5 g/dL   HCT 46.5 (H) 35.0 - 45.0 %   MCV 87.1 80.0 - 100.0 fL   MCH 29.4 27.0 - 33.0 pg   MCHC 33.8 32.0 - 36.0 g/dL   RDW 12.9 11.0 - 15.0 %   Platelets 404 (H) 140 - 400 Thousand/uL   MPV 10.5 7.5 - 12.5 fL   Neutro Abs 6,409 1,500 - 7,800 cells/uL   Lymphs Abs 1,496 850 - 3,900 cells/uL   Absolute Monocytes 459 200 - 950 cells/uL   Eosinophils Absolute 102 15 - 500 cells/uL   Basophils Absolute 34 0 - 200 cells/uL   Neutrophils Relative % 75.4 %   Total Lymphocyte 17.6 %   Monocytes Relative 5.4 %   Eosinophils Relative 1.2 %   Basophils Relative 0.4 %   Last labs reviewed with her today.    Assessment & Plan:   1. Polycythemia Discussed this entity with her briefly today, with her hemoglobin and hematocrit  higher on most recent check.  Felt best first to recheck labs today to help reassess.  Hydration issues can contribute.  Noted primary and secondary causes, and often erythropoietin levels are checked as one of the initial tests. Await results of recheck today before initiating any further work-up, and if the numbers are further increased, likely will consult hematology colleagues to help with the diagnosis and further management. (Especially noting I would not be able to continue to follow much longer, as I will be leaving this practice in a couple weeks time and she is aware of that) - CBC with Differential/Platelet  2. Hypokalemia Noted to her the slightly low potassium on last check and will recheck today.  - BASIC METABOLIC PANEL WITH GFR  3. Multiple thyroid nodules  4. Low TSH level Her thyroid panel last check was normal with the exception of a low TSH, which is consistent with higher thyroid levels.  Felt best to recheck those labs today. Also as discussed last visit, following up again with endocrinology as recommended as was continued surveillance with ultrasounds for her thyroid nodule history. She has not called to arrange a follow-up after prior recommendations, and I did place a referral today to endocrinology to help, and she was in agreement with this and seeing them. - Ambulatory referral to Endocrinology - Thyroid Panel With TSH  5. Resting tremor Continues to follow-up with neurology, with her the patient's history, the noted that the diagnosis of Parkinson's is more likely on the last visit. Gabapentin was added to the Sinemet product at the last visit. Continued follow-up with them recommended.  6. Essential hypertension Her blood pressure was borderline high today, and continuing to monitor presently felt best  7. Obesity (BMI 35.0-39.9 without comorbidity) Weights have been stable  8. Hx of completed stroke Noted this history, and remains on a statin and a baby  aspirin daily   Await lab results from today, with the referral placed to endocrine and continued follow-up with neurology important.  Also may ask for hematology's input pending recheck of these labs today. Recommended a follow-up in approximately 2 months time, sooner as needed, and she is aware that follow-up will be with a new provider as I will be leaving this practice prior to that planned follow-up.     Towanda Malkin, MD 06/10/20 10:04 AM

## 2020-06-10 ENCOUNTER — Other Ambulatory Visit: Payer: Self-pay

## 2020-06-10 ENCOUNTER — Encounter: Payer: Self-pay | Admitting: Internal Medicine

## 2020-06-10 ENCOUNTER — Ambulatory Visit (INDEPENDENT_AMBULATORY_CARE_PROVIDER_SITE_OTHER): Payer: Medicare Other | Admitting: Internal Medicine

## 2020-06-10 VITALS — BP 130/90 | HR 106 | Temp 98.4°F | Resp 16 | Ht 62.0 in | Wt 177.8 lb

## 2020-06-10 DIAGNOSIS — D751 Secondary polycythemia: Secondary | ICD-10-CM | POA: Diagnosis not present

## 2020-06-10 DIAGNOSIS — E042 Nontoxic multinodular goiter: Secondary | ICD-10-CM

## 2020-06-10 DIAGNOSIS — Z8673 Personal history of transient ischemic attack (TIA), and cerebral infarction without residual deficits: Secondary | ICD-10-CM

## 2020-06-10 DIAGNOSIS — R7989 Other specified abnormal findings of blood chemistry: Secondary | ICD-10-CM | POA: Diagnosis not present

## 2020-06-10 DIAGNOSIS — E876 Hypokalemia: Secondary | ICD-10-CM

## 2020-06-10 DIAGNOSIS — I10 Essential (primary) hypertension: Secondary | ICD-10-CM

## 2020-06-10 DIAGNOSIS — E669 Obesity, unspecified: Secondary | ICD-10-CM

## 2020-06-10 DIAGNOSIS — G252 Other specified forms of tremor: Secondary | ICD-10-CM

## 2020-06-11 LAB — CBC WITH DIFFERENTIAL/PLATELET
Absolute Monocytes: 459 cells/uL (ref 200–950)
Basophils Absolute: 41 cells/uL (ref 0–200)
Basophils Relative: 0.5 %
Eosinophils Absolute: 74 cells/uL (ref 15–500)
Eosinophils Relative: 0.9 %
HCT: 43.8 % (ref 35.0–45.0)
Hemoglobin: 14.8 g/dL (ref 11.7–15.5)
Lymphs Abs: 1517 cells/uL (ref 850–3900)
MCH: 29.7 pg (ref 27.0–33.0)
MCHC: 33.8 g/dL (ref 32.0–36.0)
MCV: 87.8 fL (ref 80.0–100.0)
MPV: 10.3 fL (ref 7.5–12.5)
Monocytes Relative: 5.6 %
Neutro Abs: 6109 cells/uL (ref 1500–7800)
Neutrophils Relative %: 74.5 %
Platelets: 425 10*3/uL — ABNORMAL HIGH (ref 140–400)
RBC: 4.99 10*6/uL (ref 3.80–5.10)
RDW: 12.9 % (ref 11.0–15.0)
Total Lymphocyte: 18.5 %
WBC: 8.2 10*3/uL (ref 3.8–10.8)

## 2020-06-11 LAB — BASIC METABOLIC PANEL WITH GFR
BUN: 14 mg/dL (ref 7–25)
CO2: 28 mmol/L (ref 20–32)
Calcium: 10.4 mg/dL (ref 8.6–10.4)
Chloride: 103 mmol/L (ref 98–110)
Creat: 0.8 mg/dL (ref 0.60–0.93)
GFR, Est African American: 86 mL/min/{1.73_m2} (ref >=60)
GFR, Est Non African American: 74 mL/min/{1.73_m2} (ref >=60)
Glucose, Bld: 93 mg/dL (ref 65–99)
Potassium: 4.9 mmol/L (ref 3.5–5.3)
Sodium: 142 mmol/L (ref 135–146)

## 2020-06-11 LAB — THYROID PANEL WITH TSH
Free Thyroxine Index: 2.4 (ref 1.4–3.8)
T3 Uptake: 31 % (ref 22–35)
T4, Total: 7.9 ug/dL (ref 5.1–11.9)
TSH: 0.29 mIU/L — ABNORMAL LOW (ref 0.40–4.50)

## 2020-06-15 ENCOUNTER — Telehealth: Payer: Self-pay

## 2020-06-15 NOTE — Telephone Encounter (Signed)
Copied from [place] [id number]. Topic: Referral - Status >> Jun 15, 2020 12:20 PM Erick Blinks wrote: Reason for CRM: Pt called back to report that she did not learn anything new during her appt today with [facility]. She made her an appt for July 11th for bloodwork, and then July 18th for an Ultrasound. Provider does not think her tremors are coming from her thyroid problems.   Best contact: [phone]

## 2020-07-20 ENCOUNTER — Other Ambulatory Visit: Payer: Self-pay

## 2020-07-20 ENCOUNTER — Other Ambulatory Visit: Payer: Self-pay | Admitting: Internal Medicine

## 2020-07-20 DIAGNOSIS — I10 Essential (primary) hypertension: Secondary | ICD-10-CM

## 2020-07-20 DIAGNOSIS — R609 Edema, unspecified: Secondary | ICD-10-CM

## 2020-07-20 MED ORDER — HYDROCHLOROTHIAZIDE 25 MG PO TABS: 25.0000 mg | ORAL_TABLET | Freq: Every day | ORAL | 1 refills | Status: DC

## 2020-07-20 NOTE — Telephone Encounter (Signed)
Medication Refill - Medication: hydrochlorothiazide (HYDRODIURIL) 25 MG tablet    Has the patient contacted their pharmacy? Yes (Agent: If no, request that the patient contact the pharmacy for the refill.) (Agent: If yes, when and what did the pharmacy advise?) Pt states that the pharmacy texed her and request that a new script is needed for further refills, [place] Dr Lemmie Evens is no longer at practice / Preferred Pharmacy (with phone number or street name): [place], [address], [address] Phone:  [phone]  Fax:  [phone]      Agent: Please be advised that RX refills may take up to 3 business days. We ask that you follow-up with your pharmacy.

## 2020-07-20 NOTE — Telephone Encounter (Signed)
   Notes to clinic: review for refill Medication was last filled by Dr. Roxan Hockey Has not seen another provider    Requested Prescriptions  Pending Prescriptions Disp Refills   hydrochlorothiazide (HYDRODIURIL) 25 MG tablet 90 tablet 3    Sig: Take 1 tablet (25 mg total) by mouth daily.      Cardiovascular: Diuretics - Thiazide Failed - 07/20/2020  9:56 AM      Failed - Last BP in normal range    BP Readings from Last 1 Encounters:  06/10/20 130/90          Passed - Ca in normal range and within 360 days    Calcium  Date Value Ref Range Status  06/10/2020 10.4 8.6 - 10.4 mg/dL Final          Passed - Cr in normal range and within 360 days    Creat  Date Value Ref Range Status  06/10/2020 0.80 0.60 - 0.93 mg/dL Final    Comment:    For patients >21 years of age, the reference limit for Creatinine is approximately 13% higher for people identified as African-American. .           Passed - K in normal range and within 360 days    Potassium  Date Value Ref Range Status  06/10/2020 4.9 3.5 - 5.3 mmol/L Final          Passed - Na in normal range and within 360 days    Sodium  Date Value Ref Range Status  06/10/2020 142 135 - 146 mmol/L Final          Passed - Valid encounter within last 6 months    Recent Outpatient Visits           1 month ago [place], MD   1 month ago Essential hypertension   [place], MD   4 months ago [place], MD   5 months ago [place], MD   6 months ago Essential hypertension   [place], MD       Future Appointments             In 7 months [facility], [facility]

## 2020-07-26 ENCOUNTER — Ambulatory Visit (INDEPENDENT_AMBULATORY_CARE_PROVIDER_SITE_OTHER): Payer: Medicare Other | Admitting: Vascular Surgery

## 2020-07-26 ENCOUNTER — Encounter (INDEPENDENT_AMBULATORY_CARE_PROVIDER_SITE_OTHER): Payer: Medicare Other

## 2020-07-27 ENCOUNTER — Other Ambulatory Visit: Payer: Self-pay

## 2020-07-27 DIAGNOSIS — K219 Gastro-esophageal reflux disease without esophagitis: Secondary | ICD-10-CM

## 2020-07-29 ENCOUNTER — Other Ambulatory Visit: Payer: Self-pay | Admitting: Internal Medicine

## 2020-07-29 DIAGNOSIS — K219 Gastro-esophageal reflux disease without esophagitis: Secondary | ICD-10-CM

## 2020-07-29 NOTE — Telephone Encounter (Signed)
   Notes to clinic:  Patient last seen be Dr Roxan Hockey  Hasn't seen another provider  Review for refill    Requested Prescriptions  Pending Prescriptions Disp Refills   omeprazole (PRILOSEC) 20 MG capsule 45 capsule 1    Sig: Take 1 capsule (20 mg total) by mouth every other day.      Gastroenterology: Proton Pump Inhibitors Passed - 07/29/2020  9:52 AM      Passed - Valid encounter within last 12 months    Recent Outpatient Visits           1 month ago [place], MD   2 months ago Essential hypertension   [place], MD   4 months ago [place], MD   5 months ago [place], MD   7 months ago Essential hypertension   [place], MD       Future Appointments             In 7 months [facility], [facility]

## 2020-07-29 NOTE — Telephone Encounter (Signed)
Copied from [place] [id number]. Topic: Quick Communication - Rx Refill/Question >> Jul 29, 2020  9:49 AM Tessa Lerner A wrote: Medication: omeprazole (PRILOSEC) 20 MG capsule   Has the patient contacted their pharmacy? Yes. Patient was directed to contact PCP  Preferred Pharmacy (with phone number or street name): [place], [place] [place], [address]  Phone:  [phone]  Agent: Please be advised that RX refills may take up to 3 business days. We ask that you follow-up with your pharmacy.

## 2020-08-02 ENCOUNTER — Other Ambulatory Visit: Payer: Self-pay

## 2020-08-16 ENCOUNTER — Other Ambulatory Visit: Payer: Self-pay

## 2020-08-16 DIAGNOSIS — K219 Gastro-esophageal reflux disease without esophagitis: Secondary | ICD-10-CM

## 2020-08-16 MED ORDER — OMEPRAZOLE 20 MG PO CPDR: 20.0000 mg | DELAYED_RELEASE_CAPSULE | ORAL | 3 refills | Status: DC

## 2020-09-08 ENCOUNTER — Encounter: Payer: Self-pay | Admitting: Ophthalmology

## 2020-09-08 ENCOUNTER — Other Ambulatory Visit: Payer: Self-pay

## 2020-09-12 DIAGNOSIS — G20 Parkinson's disease: Secondary | ICD-10-CM | POA: Insufficient documentation

## 2020-09-13 ENCOUNTER — Other Ambulatory Visit: Payer: Medicare Other

## 2020-09-14 NOTE — Discharge Instructions (Signed)

## 2020-09-15 ENCOUNTER — Encounter
Admission: RE | Disposition: A | Discharge status: Home / Self Care | Payer: Self-pay | Source: Home / Self Care | Attending: Ophthalmology

## 2020-09-15 ENCOUNTER — Ambulatory Visit: Payer: Medicare Other | Admitting: Anesthesiology

## 2020-09-15 ENCOUNTER — Ambulatory Visit
Admission: RE | Admit: 2020-09-15 | Discharge: 2020-09-15 | Disposition: A | Discharge status: Home / Self Care | Payer: Medicare Other | Attending: Ophthalmology | Admitting: Ophthalmology

## 2020-09-15 ENCOUNTER — Other Ambulatory Visit: Payer: Self-pay

## 2020-09-15 ENCOUNTER — Encounter: Payer: Self-pay | Admitting: Ophthalmology

## 2020-09-15 DIAGNOSIS — I10 Essential (primary) hypertension: Secondary | ICD-10-CM | POA: Insufficient documentation

## 2020-09-15 DIAGNOSIS — Z90710 Acquired absence of both cervix and uterus: Secondary | ICD-10-CM | POA: Insufficient documentation

## 2020-09-15 DIAGNOSIS — Z8673 Personal history of transient ischemic attack (TIA), and cerebral infarction without residual deficits: Secondary | ICD-10-CM | POA: Insufficient documentation

## 2020-09-15 DIAGNOSIS — Z79899 Other long term (current) drug therapy: Secondary | ICD-10-CM | POA: Insufficient documentation

## 2020-09-15 DIAGNOSIS — Z9049 Acquired absence of other specified parts of digestive tract: Secondary | ICD-10-CM | POA: Insufficient documentation

## 2020-09-15 DIAGNOSIS — Z885 Allergy status to narcotic agent status: Secondary | ICD-10-CM | POA: Insufficient documentation

## 2020-09-15 DIAGNOSIS — Z8249 Family history of ischemic heart disease and other diseases of the circulatory system: Secondary | ICD-10-CM | POA: Insufficient documentation

## 2020-09-15 DIAGNOSIS — Z96641 Presence of right artificial hip joint: Secondary | ICD-10-CM | POA: Diagnosis not present

## 2020-09-15 DIAGNOSIS — H409 Unspecified glaucoma: Secondary | ICD-10-CM | POA: Diagnosis not present

## 2020-09-15 DIAGNOSIS — H2511 Age-related nuclear cataract, right eye: Secondary | ICD-10-CM | POA: Diagnosis present

## 2020-09-15 DIAGNOSIS — Z7982 Long term (current) use of aspirin: Secondary | ICD-10-CM | POA: Diagnosis not present

## 2020-09-15 HISTORY — PX: CATARACT EXTRACTION W/PHACO: SHX586

## 2020-09-15 SURGERY — PHACOEMULSIFICATION, CATARACT, WITH IOL INSERTION
Anesthesia: Monitor Anesthesia Care | Site: Eye | Laterality: Right

## 2020-09-15 MED ORDER — MIDAZOLAM HCL 2 MG/2ML IJ SOLN
INTRAMUSCULAR | Status: DC | PRN
  Administered 2020-09-15: 1 mg via INTRAVENOUS

## 2020-09-15 MED ORDER — FENTANYL CITRATE (PF) 100 MCG/2ML IJ SOLN
INTRAMUSCULAR | Status: DC | PRN
  Administered 2020-09-15: 50 ug via INTRAVENOUS

## 2020-09-15 MED ORDER — BRIMONIDINE TARTRATE-TIMOLOL 0.2-0.5 % OP SOLN
OPHTHALMIC | Status: DC | PRN
  Administered 2020-09-15: 1 [drp] via OPHTHALMIC

## 2020-09-15 MED ORDER — EPINEPHRINE PF 1 MG/ML IJ SOLN
INTRAOCULAR | Status: DC | PRN
  Administered 2020-09-15: 58 mL via OPHTHALMIC

## 2020-09-15 MED ORDER — TETRACAINE HCL 0.5 % OP SOLN
1.0000 [drp] | OPHTHALMIC | Status: DC | PRN
  Administered 2020-09-15 (×3): 1 [drp] via OPHTHALMIC

## 2020-09-15 MED ORDER — CEFUROXIME OPHTHALMIC INJECTION 1 MG/0.1 ML
INJECTION | OPHTHALMIC | Status: DC | PRN
  Administered 2020-09-15: 0.1 mL via INTRACAMERAL

## 2020-09-15 MED ORDER — NA HYALUR & NA CHOND-NA HYALUR 0.4-0.35 ML IO KIT
PACK | INTRAOCULAR | Status: DC | PRN
  Administered 2020-09-15: 1 mL via INTRAOCULAR

## 2020-09-15 MED ORDER — LIDOCAINE HCL (PF) 2 % IJ SOLN
INTRAOCULAR | Status: DC | PRN
  Administered 2020-09-15: 1 mL

## 2020-09-15 MED ORDER — ARMC OPHTHALMIC DILATING DROPS
1.0000 "application " | OPHTHALMIC | Status: DC | PRN
  Administered 2020-09-15 (×3): 1 via OPHTHALMIC

## 2020-09-15 SURGICAL SUPPLY — 24 items
CANNULA ANT/CHMB 27GA (MISCELLANEOUS) ×2 IMPLANT
GLOVE SURG TRIUMPH 8.0 PF LTX (GLOVE) ×6 IMPLANT
GOWN STRL REUS W/ TWL LRG LVL3 (GOWN DISPOSABLE) ×2 IMPLANT
GOWN STRL REUS W/TWL LRG LVL3 (GOWN DISPOSABLE) ×4
LENS IOL DIOP 20.5 (Intraocular Lens) ×2 IMPLANT
LENS IOL TECNIS MONO 20.5 (Intraocular Lens) ×1 IMPLANT
MARKER SKIN DUAL TIP RULER LAB (MISCELLANEOUS) ×2 IMPLANT
NDL RETROBULBAR .5 NSTRL (NEEDLE) IMPLANT
NEEDLE CAPSULORHEX 25GA (NEEDLE) ×2 IMPLANT
NEEDLE FILTER BLUNT 18X 1/2SAF (NEEDLE) ×2
NEEDLE FILTER BLUNT 18X1 1/2 (NEEDLE) ×2 IMPLANT
PACK CATARACT BRASINGTON (MISCELLANEOUS) ×2 IMPLANT
PACK EYE AFTER SURG (MISCELLANEOUS) ×2 IMPLANT
PACK OPTHALMIC (MISCELLANEOUS) ×2 IMPLANT
RING MALYGIN 7.0 (MISCELLANEOUS) IMPLANT
SOLUTION OPHTHALMIC SALT (MISCELLANEOUS) ×2 IMPLANT
SUT ETHILON 10-0 CS-B-6CS-B-6 (SUTURE)
SUT VICRYL  9 0 (SUTURE)
SUT VICRYL 9 0 (SUTURE) IMPLANT
SUTURE EHLN 10-0 CS-B-6CS-B-6 (SUTURE) IMPLANT
SYR 3ML LL SCALE MARK (SYRINGE) ×4 IMPLANT
SYR TB 1ML LUER SLIP (SYRINGE) ×2 IMPLANT
WATER STERILE IRR 250ML POUR (IV SOLUTION) ×2 IMPLANT
WIPE NON LINTING 3.25X3.25 (MISCELLANEOUS) ×2 IMPLANT

## 2020-09-15 NOTE — H&P (Signed)
[place]   Primary Care Physician:  Towanda Malkin, MD Ophthalmologist: Dr. Leandrew Koyanagi  Pre-Procedure History & Physical: HPI:  Patricia Richardson is a 70 y.o. female here for ophthalmic surgery.   Past Medical History:  Diagnosis Date  . 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 Arthritis   . Cystocele   . Depression   . Essential hypertension, benign 06/17/2018  . GERD (gastroesophageal reflux disease)   . Glaucoma   . History of chicken pox   . Hypertension   . 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 ABDOMINAL HYSTERECTOMY    . BREAST BIOPSY Right 11/03/2005   core biopsy w/ clip placement  . CHOLECYSTECTOMY    . COLONOSCOPY    . COLONOSCOPY WITH PROPOFOL N/A 11/05/2015   Procedure: COLONOSCOPY WITH PROPOFOL;  Surgeon: Lollie Sails, MD;  Location: [facility] ENDOSCOPY;  Service: Endoscopy;  Laterality: N/A;  . KNEE ARTHROSCOPY Right 02/21/08  . TOTAL HIP ARTHROPLASTY Right 04/09/2018   Procedure: TOTAL HIP ARTHROPLASTY ANTERIOR APPROACH;  Surgeon: Hessie Knows, MD;  Location: [facility] ORS;  Service: Orthopedics;  Laterality: Right;  . WISDOM TOOTH EXTRACTION      Prior to Admission medications   Medication Sig Start Date End Date Taking? Authorizing Provider  acetaminophen (TYLENOL) 500 MG tablet Take 1-2 tablets (500-1,000 mg total) by mouth every 6 (six) hours as needed for mild pain (pain score 1-3 or temp > 100.5). 04/11/18  Yes Duanne Guess, PA-C  amLODipine (NORVASC) 10 MG tablet Take 1 tablet (10 mg total) by mouth daily. 04/06/20  Yes Towanda Malkin, MD  aspirin EC 81 MG tablet Take 1 tablet (81 mg total) by mouth daily. 08/28/19  Yes Lebron Conners D, MD  atorvastatin (LIPITOR) 10 MG tablet TAKE 1 TABLET BY MOUTH AT BEDTIME 2 NIGHTS PER WEEK 02/19/20  Yes Lebron Conners D, MD  carbidopa-levodopa (SINEMET IR) 25-100 MG tablet Take by mouth. 04/08/20  Yes [provider]  gabapentin  (NEURONTIN) 100 MG capsule Take by mouth. 06/08/20  Yes [provider]  hydrochlorothiazide (HYDRODIURIL) 25 MG tablet Take 1 tablet (25 mg total) by mouth daily. 07/20/20  Yes [place], Jake Church, DO  latanoprost (XALATAN) 0.005 % ophthalmic solution Place 1 drop into both eyes at bedtime.    Yes [provider]  omeprazole (PRILOSEC) 20 MG capsule Take 1 capsule (20 mg total) by mouth every other day. 08/16/20  Yes Myles Gip, DO  OVER THE COUNTER MEDICATION Take 1 tablet by mouth daily as needed (constipation). Swiss Kriss herbal laxative   Yes [provider]    Allergies as of 07/27/2020 - Review Complete 06/10/2020  Allergen Reaction Noted  . Codeine Itching 11/04/2015    Family History  Problem Relation Age of Onset  . Breast cancer Maternal Aunt        great  . Kidney disease Mother   . Diabetes Mother   . Hypertension Father   . Kidney disease Father   . Diabetes Brother   . Diabetes Paternal Grandmother     Social History   Socioeconomic History  . Marital status: Divorced    Spouse name: Not on file  . Number of children: 2  . Years of education: 72  . Highest education level: Not on file  Occupational History  . Not on file  Tobacco Use  . Smoking status: Never Smoker  . Smokeless tobacco: Never Used  Vaping  Use  . Vaping Use: Never used  Substance and Sexual Activity  . Alcohol use: Yes    Comment: rarely  . Drug use: No  . Sexual activity: Not Currently  Other Topics Concern  . Not on file  Social History Narrative   Pt lives alone   Social Determinants of Health   Financial Resource Strain: Low Risk   . Difficulty of Paying Living Expenses: Not hard at all  Food Insecurity: No Food Insecurity  . Worried About Charity fundraiser in the Last Year: Never true  . Ran Out of Food in the Last Year: Never true  Transportation Needs: No Transportation Needs  . Lack of Transportation (Medical): No  . Lack of Transportation  (Non-Medical): No  Physical Activity: Inactive  . Days of Exercise per Week: 0 days  . Minutes of Exercise per Session: 0 min  Stress: No Stress Concern Present  . Feeling of Stress : Only a little  Social Connections: Moderately Isolated  . Frequency of Communication with Friends and Family: More than three times a week  . Frequency of Social Gatherings with Friends and Family: Twice a week  . Attends Religious Services: More than 4 times per year  . Active Member of Clubs or Organizations: No  . Attends Archivist Meetings: Never  . Marital Status: Divorced  Human resources officer Violence: Not At Risk  . Fear of Current or Ex-Partner: No  . Emotionally Abused: No  . Physically Abused: No  . Sexually Abused: No    Review of Systems: See HPI, otherwise negative ROS  Physical Exam: BP 131/74   Pulse 78   Temp (!) 97.3 F (36.3 C) (Temporal)   Ht 5\' 2"  (1.575 m)   Wt 82.2 kg   SpO2 98%   BMI 33.14 kg/m  General:   Alert,  pleasant and cooperative in NAD Head:  Normocephalic and atraumatic. Lungs:  Clear to auscultation.    Heart:  Regular rate and rhythm.   Impression/Plan: Patricia Richardson is here for ophthalmic surgery.  Risks, benefits, limitations, and alternatives regarding ophthalmic surgery have been reviewed with the patient.  Questions have been answered.  All parties agreeable.   Leandrew Koyanagi, MD  09/15/2020, 8:16 AM

## 2020-09-15 NOTE — Op Note (Signed)
LOCATION:  [place]   PREOPERATIVE DIAGNOSIS:    Nuclear sclerotic cataract right eye. H25.11   POSTOPERATIVE DIAGNOSIS:  Nuclear sclerotic cataract right eye.     PROCEDURE:  Phacoemusification with posterior chamber intraocular lens placement of the right eye   ULTRASOUND TIME: Procedure(s) with comments: CATARACT EXTRACTION PHACO AND INTRAOCULAR LENS PLACEMENT (IOC) RIGHT (Right) - 4.70 0:57.4 8.2%  LENS:   Implant Name Type Inv. Item Serial No. Manufacturer Lot No. LRB No. Used Action  LENS IOL DIOP 20.5 - [id number] Intraocular Lens LENS IOL DIOP 20.5 [id number] JOHNSON   Right 1 Implanted         SURGEON:  Wyonia Hough, MD   ANESTHESIA:  Topical with tetracaine drops and 2% Xylocaine jelly, augmented with 1% preservative-free intracameral lidocaine.    COMPLICATIONS:  None.   DESCRIPTION OF PROCEDURE:  The patient was identified in the holding room and transported to the operating room and placed in the supine position under the operating microscope.  The right eye was identified as the operative eye and it was prepped and draped in the usual sterile ophthalmic fashion.   A 1 millimeter clear-corneal paracentesis was made at the 12:00 position.  0.5 ml of preservative-free 1% lidocaine was injected into the anterior chamber. The anterior chamber was filled with Viscoat viscoelastic.  A 2.4 millimeter keratome was used to make a near-clear corneal incision at the 9:00 position.  A curvilinear capsulorrhexis was made with a cystotome and capsulorrhexis forceps.  Balanced salt solution was used to hydrodissect and hydrodelineate the nucleus.   Phacoemulsification was then used in stop and chop fashion to remove the lens nucleus and epinucleus.  The remaining cortex was then removed using the irrigation and aspiration handpiece. Provisc was then placed into the capsular bag to distend it for lens placement.  A lens was then injected into the capsular bag.  The  remaining viscoelastic was aspirated.   Wounds were hydrated with balanced salt solution.  The anterior chamber was inflated to a physiologic pressure with balanced salt solution.  No wound leaks were noted. Cefuroxime 0.1 ml of a 10mg /ml solution was injected into the anterior chamber for a dose of 1 mg of intracameral antibiotic at the completion of the case.   Timolol and Brimonidine drops were applied to the eye.  The patient was taken to the recovery room in stable condition without complications of anesthesia or surgery.   Elida Harbin 09/15/2020, 9:03 AM

## 2020-09-15 NOTE — Anesthesia Postprocedure Evaluation (Signed)
Anesthesia Post Note  Patient: Patricia Richardson  Procedure(s) Performed: CATARACT EXTRACTION PHACO AND INTRAOCULAR LENS PLACEMENT (IOC) RIGHT (Right Eye)     Patient location during evaluation: PACU Anesthesia Type: MAC Level of consciousness: awake and alert Pain management: pain level controlled Vital Signs Assessment: post-procedure vital signs reviewed and stable Respiratory status: spontaneous breathing Cardiovascular status: blood pressure returned to baseline Postop Assessment: no apparent nausea or vomiting, adequate PO intake and no headache Anesthetic complications: no   No complications documented.  Adele Barthel [name]

## 2020-09-15 NOTE — Transfer of Care (Signed)
Immediate Anesthesia Transfer of Care Note  Patient: Patricia Richardson  Procedure(s) Performed: CATARACT EXTRACTION PHACO AND INTRAOCULAR LENS PLACEMENT (IOC) RIGHT (Right Eye)  Patient Location: PACU  Anesthesia Type: MAC  Level of Consciousness: awake, alert  and patient cooperative  Airway and Oxygen Therapy: Patient Spontanous Breathing and Patient connected to supplemental oxygen  Post-op Assessment: Post-op Vital signs reviewed, Patient's Cardiovascular Status Stable, Respiratory Function Stable, Patent Airway and No signs of Nausea or vomiting  Post-op Vital Signs: Reviewed and stable  Complications: No complications documented.

## 2020-09-15 NOTE — Anesthesia Preprocedure Evaluation (Signed)
Anesthesia Evaluation  Patient identified by MRN, date of birth, ID band Patient awake    History of Anesthesia Complications Negative for: history of anesthetic complications  Airway Mallampati: III  TM Distance: >3 FB Neck ROM: Full    Dental no notable dental hx.    Pulmonary    Pulmonary exam normal        Cardiovascular Exercise Tolerance: Good hypertension, Pt. 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 (TIA 2013)    GI/Hepatic negative GI ROS, Neg liver ROS,   Endo/Other  Morbid obesity (BMI 33)  Renal/GU negative Renal ROS     Musculoskeletal   Abdominal   Peds  Hematology   Anesthesia Other Findings   Reproductive/Obstetrics                            Anesthesia Physical Anesthesia Plan  ASA: III  Anesthesia Plan: MAC   Post-op Pain Management:    Induction: Intravenous  PONV Risk Score and Plan: 2 and Treatment may vary due to age or medical condition, Midazolam and TIVA  Airway Management Planned: Nasal Cannula and Natural Airway  Additional Equipment: None  Intra-op Plan:   Post-operative Plan:   Informed Consent: I have reviewed the patients History and Physical, chart, labs and discussed the procedure including the risks, benefits and alternatives for the proposed anesthesia with the patient or authorized representative who has indicated his/her understanding and acceptance.       Plan Discussed with: CRNA  Anesthesia Plan Comments:         Anesthesia Quick Evaluation

## 2020-09-15 NOTE — Anesthesia Procedure Notes (Signed)
Date/Time: 09/15/2020 8:46 AM Performed by: Dionne Bucy, CRNA Pre-anesthesia Checklist: Patient identified, Emergency Drugs available, Suction available, Patient being monitored and Timeout performed Patient Re-evaluated:Patient Re-evaluated prior to induction Oxygen Delivery Method: Nasal cannula Placement Confirmation: positive ETCO2

## 2020-09-16 ENCOUNTER — Encounter: Payer: Self-pay | Admitting: Ophthalmology

## 2020-09-21 ENCOUNTER — Encounter: Payer: Self-pay | Admitting: Ophthalmology

## 2020-09-21 ENCOUNTER — Other Ambulatory Visit: Payer: Self-pay

## 2020-09-27 NOTE — Discharge Instructions (Signed)

## 2020-09-29 ENCOUNTER — Ambulatory Visit
Admission: RE | Admit: 2020-09-29 | Discharge: 2020-09-29 | Disposition: A | Discharge status: Home / Self Care | Payer: Medicare Other | Attending: Ophthalmology | Admitting: Ophthalmology

## 2020-09-29 ENCOUNTER — Ambulatory Visit: Payer: Medicare Other | Admitting: Anesthesiology

## 2020-09-29 ENCOUNTER — Encounter
Admission: RE | Disposition: A | Discharge status: Home / Self Care | Payer: Self-pay | Source: Home / Self Care | Attending: Ophthalmology

## 2020-09-29 ENCOUNTER — Encounter: Payer: Self-pay | Admitting: Ophthalmology

## 2020-09-29 ENCOUNTER — Other Ambulatory Visit: Payer: Self-pay

## 2020-09-29 DIAGNOSIS — Z7982 Long term (current) use of aspirin: Secondary | ICD-10-CM | POA: Diagnosis not present

## 2020-09-29 DIAGNOSIS — H2512 Age-related nuclear cataract, left eye: Secondary | ICD-10-CM | POA: Insufficient documentation

## 2020-09-29 DIAGNOSIS — Z96641 Presence of right artificial hip joint: Secondary | ICD-10-CM | POA: Insufficient documentation

## 2020-09-29 DIAGNOSIS — Z79899 Other long term (current) drug therapy: Secondary | ICD-10-CM | POA: Diagnosis not present

## 2020-09-29 DIAGNOSIS — Z8673 Personal history of transient ischemic attack (TIA), and cerebral infarction without residual deficits: Secondary | ICD-10-CM | POA: Diagnosis not present

## 2020-09-29 HISTORY — PX: CATARACT EXTRACTION W/PHACO: SHX586

## 2020-09-29 SURGERY — PHACOEMULSIFICATION, CATARACT, WITH IOL INSERTION
Anesthesia: Monitor Anesthesia Care | Site: Eye | Laterality: Left

## 2020-09-29 MED ORDER — LACTATED RINGERS IV SOLN: INTRAVENOUS | Status: DC

## 2020-09-29 MED ORDER — ARMC OPHTHALMIC DILATING DROPS
1.0000 "application " | OPHTHALMIC | Status: DC | PRN
  Administered 2020-09-29 (×3): 1 via OPHTHALMIC

## 2020-09-29 MED ORDER — BRIMONIDINE TARTRATE-TIMOLOL 0.2-0.5 % OP SOLN
OPHTHALMIC | Status: DC | PRN
  Administered 2020-09-29: 1 [drp] via OPHTHALMIC

## 2020-09-29 MED ORDER — LIDOCAINE HCL (PF) 2 % IJ SOLN
INTRAOCULAR | Status: DC | PRN
  Administered 2020-09-29: 1 mL

## 2020-09-29 MED ORDER — NA HYALUR & NA CHOND-NA HYALUR 0.4-0.35 ML IO KIT
PACK | INTRAOCULAR | Status: DC | PRN
  Administered 2020-09-29: 1 mL via INTRAOCULAR

## 2020-09-29 MED ORDER — CEFUROXIME OPHTHALMIC INJECTION 1 MG/0.1 ML
INJECTION | OPHTHALMIC | Status: DC | PRN
  Administered 2020-09-29: 0.1 mL via INTRACAMERAL

## 2020-09-29 MED ORDER — TETRACAINE HCL 0.5 % OP SOLN
1.0000 [drp] | OPHTHALMIC | Status: DC | PRN
  Administered 2020-09-29 (×3): 1 [drp] via OPHTHALMIC

## 2020-09-29 MED ORDER — EPINEPHRINE PF 1 MG/ML IJ SOLN
INTRAOCULAR | Status: DC | PRN
  Administered 2020-09-29: 48 mL via OPHTHALMIC

## 2020-09-29 MED ORDER — MIDAZOLAM HCL 2 MG/2ML IJ SOLN
INTRAMUSCULAR | Status: DC | PRN
  Administered 2020-09-29: 2 mg via INTRAVENOUS

## 2020-09-29 MED ORDER — FENTANYL CITRATE (PF) 100 MCG/2ML IJ SOLN
INTRAMUSCULAR | Status: DC | PRN
  Administered 2020-09-29 (×2): 50 ug via INTRAVENOUS

## 2020-09-29 SURGICAL SUPPLY — 19 items
CANNULA ANT/CHMB 27GA (MISCELLANEOUS) ×2 IMPLANT
GLOVE SURG ENC TEXT LTX SZ7.5 (GLOVE) ×2 IMPLANT
GLOVE SURG TRIUMPH 8.0 PF LTX (GLOVE) ×2 IMPLANT
GOWN STRL REUS W/ TWL LRG LVL3 (GOWN DISPOSABLE) ×2 IMPLANT
GOWN STRL REUS W/TWL LRG LVL3 (GOWN DISPOSABLE) ×4
LENS IOL DIOP 20.0 (Intraocular Lens) ×2 IMPLANT
LENS IOL TECNIS MONO 20.0 (Intraocular Lens) ×1 IMPLANT
MARKER SKIN DUAL TIP RULER LAB (MISCELLANEOUS) ×2 IMPLANT
NEEDLE CAPSULORHEX 25GA (NEEDLE) ×2 IMPLANT
NEEDLE FILTER BLUNT 18X 1/2SAF (NEEDLE) ×2
NEEDLE FILTER BLUNT 18X1 1/2 (NEEDLE) ×2 IMPLANT
PACK CATARACT BRASINGTON (MISCELLANEOUS) ×2 IMPLANT
PACK EYE AFTER SURG (MISCELLANEOUS) ×2 IMPLANT
PACK OPTHALMIC (MISCELLANEOUS) ×2 IMPLANT
SOLUTION OPHTHALMIC SALT (MISCELLANEOUS) ×2 IMPLANT
SYR 3ML LL SCALE MARK (SYRINGE) ×4 IMPLANT
SYR TB 1ML LUER SLIP (SYRINGE) ×2 IMPLANT
WATER STERILE IRR 250ML POUR (IV SOLUTION) ×2 IMPLANT
WIPE NON LINTING 3.25X3.25 (MISCELLANEOUS) ×2 IMPLANT

## 2020-09-29 NOTE — Transfer of Care (Signed)
Immediate Anesthesia Transfer of Care Note  Patient: Patricia Richardson  Procedure(s) Performed: CATARACT EXTRACTION PHACO AND INTRAOCULAR LENS PLACEMENT (IOC) LEFT 3.01 00:45.7 6.6% (Left Eye)  Patient Location: PACU  Anesthesia Type: MAC  Level of Consciousness: awake, alert  and patient cooperative  Airway and Oxygen Therapy: Patient Spontanous Breathing and Patient connected to supplemental oxygen  Post-op Assessment: Post-op Vital signs reviewed, Patient's Cardiovascular Status Stable, Respiratory Function Stable, Patent Airway and No signs of Nausea or vomiting  Post-op Vital Signs: Reviewed and stable  Complications: No complications documented.

## 2020-09-29 NOTE — H&P (Signed)
[place]   Primary Care Physician:  Towanda Malkin, MD Ophthalmologist: Dr. Leandrew Koyanagi  Pre-Procedure History & Physical: HPI:  Patricia Richardson is a 70 y.o. female here for ophthalmic surgery.   Past Medical History:  Diagnosis Date  . 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 Arthritis   . Cystocele   . Depression   . Essential hypertension, benign 06/17/2018  . GERD (gastroesophageal reflux disease)   . Glaucoma   . History of chicken pox   . Hypertension   . 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 ABDOMINAL HYSTERECTOMY    . BREAST BIOPSY Right 11/03/2005   core biopsy w/ clip placement  . CATARACT EXTRACTION W/PHACO Right 09/15/2020   Procedure: CATARACT EXTRACTION PHACO AND INTRAOCULAR LENS PLACEMENT ([place]) RIGHT;  Surgeon: Leandrew Koyanagi, MD;  Location: [place];  Service: Ophthalmology;  Laterality: Right;  4.70 0:57.4 8.2%  . CHOLECYSTECTOMY    . COLONOSCOPY    . COLONOSCOPY WITH PROPOFOL N/A 11/05/2015   Procedure: COLONOSCOPY WITH PROPOFOL;  Surgeon: Lollie Sails, MD;  Location: [facility];  Service: Endoscopy;  Laterality: N/A;  . KNEE ARTHROSCOPY Right 02/21/08  . TOTAL HIP ARTHROPLASTY Right 04/09/2018   Procedure: TOTAL HIP ARTHROPLASTY ANTERIOR APPROACH;  Surgeon: Hessie Knows, MD;  Location: [facility] ORS;  Service: Orthopedics;  Laterality: Right;  . WISDOM TOOTH EXTRACTION      Prior to Admission medications   Medication Sig Start Date End Date Taking? Authorizing Provider  acetaminophen (TYLENOL) 500 MG tablet Take 1-2 tablets (500-1,000 mg total) by mouth every 6 (six) hours as needed for mild pain (pain score 1-3 or temp > 100.5). 04/11/18  Yes Duanne Guess, PA-C  amLODipine (NORVASC) 10 MG tablet Take 1 tablet (10 mg total) by mouth daily. 04/06/20  Yes Towanda Malkin, MD  aspirin EC 81 MG tablet Take 1 tablet (81 mg total) by mouth daily. 08/28/19  Yes Lebron Conners D, MD  atorvastatin (LIPITOR) 10 MG tablet TAKE 1 TABLET BY MOUTH AT BEDTIME 2 NIGHTS PER WEEK 02/19/20  Yes Lebron Conners D, MD  carbidopa-levodopa (SINEMET IR) 25-100 MG tablet Take by mouth. 04/08/20  Yes [provider]  gabapentin (NEURONTIN) 100 MG capsule Take by mouth. 06/08/20  Yes [provider]  hydrochlorothiazide (HYDRODIURIL) 25 MG tablet Take 1 tablet (25 mg total) by mouth daily. 07/20/20  Yes Myles Gip, DO  omeprazole (PRILOSEC) 20 MG capsule Take 1 capsule (20 mg total) by mouth every other day. 08/16/20  Yes Myles Gip, DO  OVER THE COUNTER MEDICATION Take 1 tablet by mouth daily as needed (constipation). Swiss Kriss herbal laxative   Yes [provider]  latanoprost (XALATAN) 0.005 % ophthalmic solution Place 1 drop into both eyes at bedtime.     [provider]    Allergies as of 07/27/2020 - Review Complete 06/10/2020  Allergen Reaction Noted  . Codeine Itching 11/04/2015    Family History  Problem Relation Age of Onset  . Breast cancer Maternal Aunt        great  . Kidney disease Mother   . Diabetes Mother   . Hypertension Father   . Kidney disease Father   . Diabetes Brother   . Diabetes Paternal Grandmother     Social History   Socioeconomic History  . Marital status: Divorced    Spouse name: Not on file  . Number of children: 2  .  Years of education: 27  . Highest education level: Not on file  Occupational History  . Not on file  Tobacco Use  . Smoking status: Never Smoker  . Smokeless tobacco: Never Used  Vaping Use  . Vaping Use: Never used  Substance and Sexual Activity  . Alcohol use: Yes    Comment: rarely  . Drug use: No  . Sexual activity: Not Currently  Other Topics Concern  . Not on file  Social History Narrative   Pt lives alone   Social Determinants of Health   Financial Resource Strain: Low Risk   . Difficulty of Paying Living Expenses: Not hard at all  Food  Insecurity: No Food Insecurity  . Worried About Charity fundraiser in the Last Year: Never true  . Ran Out of Food in the Last Year: Never true  Transportation Needs: No Transportation Needs  . Lack of Transportation (Medical): No  . Lack of Transportation (Non-Medical): No  Physical Activity: Inactive  . Days of Exercise per Week: 0 days  . Minutes of Exercise per Session: 0 min  Stress: No Stress Concern Present  . Feeling of Stress : Only a little  Social Connections: Moderately Isolated  . Frequency of Communication with Friends and Family: More than three times a week  . Frequency of Social Gatherings with Friends and Family: Twice a week  . Attends Religious Services: More than 4 times per year  . Active Member of Clubs or Organizations: No  . Attends Archivist Meetings: Never  . Marital Status: Divorced  Human resources officer Violence: Not At Risk  . Fear of Current or Ex-Partner: No  . Emotionally Abused: No  . Physically Abused: No  . Sexually Abused: No    Review of Systems: See HPI, otherwise negative ROS  Physical Exam: BP 126/79   Pulse 83   Temp (!) 97.1 F (36.2 C) (Temporal)   Wt 80.9 kg   SpO2 98%   BMI 32.61 kg/m  General:   Alert,  pleasant and cooperative in NAD Head:  Normocephalic and atraumatic. Lungs:  Clear to auscultation.    Heart:  Regular rate and rhythm.   Impression/Plan: Patricia Richardson is here for ophthalmic surgery.  Risks, benefits, limitations, and alternatives regarding ophthalmic surgery have been reviewed with the patient.  Questions have been answered.  All parties agreeable.   Leandrew Koyanagi, MD  09/29/2020, 8:08 AM

## 2020-09-29 NOTE — Anesthesia Postprocedure Evaluation (Signed)
Anesthesia Post Note  Patient: Patricia Richardson  Procedure(s) Performed: CATARACT EXTRACTION PHACO AND INTRAOCULAR LENS PLACEMENT (IOC) LEFT 3.01 00:45.7 6.6% (Left Eye)     Patient location during evaluation: PACU Anesthesia Type: MAC Level of consciousness: awake and alert Pain management: pain level controlled Vital Signs Assessment: post-procedure vital signs reviewed and stable Respiratory status: spontaneous breathing, nonlabored ventilation, respiratory function stable and patient connected to nasal cannula oxygen Cardiovascular status: stable and blood pressure returned to baseline Postop Assessment: no apparent nausea or vomiting Anesthetic complications: no   No complications documented.  Fidel Levy

## 2020-09-29 NOTE — Op Note (Signed)
OPERATIVE NOTE  Patricia Richardson [id number] 09/29/2020   PREOPERATIVE DIAGNOSIS:  Nuclear sclerotic cataract left eye. H25.12   POSTOPERATIVE DIAGNOSIS:    Nuclear sclerotic cataract left eye.     PROCEDURE:  Phacoemusification with posterior chamber intraocular lens placement of the left eye  Ultrasound time: Procedure(s): CATARACT EXTRACTION PHACO AND INTRAOCULAR LENS PLACEMENT (IOC) LEFT 3.01 00:45.7 6.6% (Left)  LENS:   Implant Name Type Inv. Item Serial No. Manufacturer Lot No. LRB No. Used Action  LENS IOL DIOP 20.0 - [id number] Intraocular Lens LENS IOL DIOP 20.0 [id number] [id number]   Left 1 Implanted      SURGEON:  Wyonia Hough, MD   ANESTHESIA:  Topical with tetracaine drops and 2% Xylocaine jelly, augmented with 1% preservative-free intracameral lidocaine.    COMPLICATIONS:  None.   DESCRIPTION OF PROCEDURE:  The patient was identified in the holding room and transported to the operating room and placed in the supine position under the operating microscope.  The left eye was identified as the operative eye and it was prepped and draped in the usual sterile ophthalmic fashion.   A 1 millimeter clear-corneal paracentesis was made at the 1:30 position.  0.5 ml of preservative-free 1% lidocaine was injected into the anterior chamber.  The anterior chamber was filled with Viscoat viscoelastic.  A 2.4 millimeter keratome was used to make a near-clear corneal incision at the 10:30 position.  .  A curvilinear capsulorrhexis was made with a cystotome and capsulorrhexis forceps.  Balanced salt solution was used to hydrodissect and hydrodelineate the nucleus.   Phacoemulsification was then used in stop and chop fashion to remove the lens nucleus and epinucleus.  The remaining cortex was then removed using the irrigation and aspiration handpiece. Provisc was then placed into the capsular bag to distend it for lens placement.  A lens was then injected into the capsular bag.  The  remaining viscoelastic was aspirated.   Wounds were hydrated with balanced salt solution.  The anterior chamber was inflated to a physiologic pressure with balanced salt solution.  No wound leaks were noted. Cefuroxime 0.1 ml of a 10mg /ml solution was injected into the anterior chamber for a dose of 1 mg of intracameral antibiotic at the completion of the case.   Timolol and Brimonidine drops were applied to the eye.  The patient was taken to the recovery room in stable condition without complications of anesthesia or surgery.  [name] 09/29/2020, 9:10 AM

## 2020-09-29 NOTE — Anesthesia Preprocedure Evaluation (Signed)
Anesthesia Evaluation  Patient identified by MRN, date of birth, ID band Patient awake    Reviewed: NPO status   History of Anesthesia Complications Negative for: history of anesthetic complications  Airway Mallampati: II  TM Distance: >3 FB Neck ROM: Full    Dental no notable dental hx.    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) CVA (TIA 2003)    GI/Hepatic Neg liver ROS, GERD  Controlled,  Endo/Other  Morbid obesity (BMI 33)Small thyroid nodules  Renal/GU negative Renal ROS  negative genitourinary   Musculoskeletal  (+) Arthritis ,   Abdominal   Peds  Hematology negative hematology ROS (+)   Anesthesia Other Findings   Reproductive/Obstetrics                             Anesthesia Physical  Anesthesia Plan  ASA: III  Anesthesia Plan: MAC   Post-op Pain Management:    Induction: Intravenous  PONV Risk Score and Plan: 2 and Midazolam and TIVA  Airway Management Planned: Nasal Cannula and Natural Airway  Additional Equipment: None  Intra-op Plan:   Post-operative Plan:   Informed Consent: I have reviewed the patients History and Physical, chart, labs and discussed the procedure including the risks, benefits and alternatives for the proposed anesthesia with the patient or authorized representative who has indicated his/her understanding and acceptance.       Plan Discussed with: CRNA  Anesthesia Plan Comments:         Anesthesia Quick Evaluation

## 2020-09-29 NOTE — Anesthesia Procedure Notes (Signed)
Procedure Name: MAC Date/Time: 09/29/2020 8:56 AM Performed by: Jeannene Patella, CRNA Pre-anesthesia Checklist: Patient identified, Emergency Drugs available, Suction available, Timeout performed and Patient being monitored Patient Re-evaluated:Patient Re-evaluated prior to induction Oxygen Delivery Method: Nasal cannula Placement Confirmation: positive ETCO2

## 2020-09-30 ENCOUNTER — Encounter: Payer: Self-pay | Admitting: Ophthalmology

## 2020-10-07 ENCOUNTER — Other Ambulatory Visit: Payer: Self-pay | Admitting: Internal Medicine

## 2020-10-07 DIAGNOSIS — R609 Edema, unspecified: Secondary | ICD-10-CM

## 2020-10-07 DIAGNOSIS — I10 Essential (primary) hypertension: Secondary | ICD-10-CM

## 2020-10-07 MED ORDER — HYDROCHLOROTHIAZIDE 25 MG PO TABS: 25.0000 mg | ORAL_TABLET | Freq: Every day | ORAL | 1 refills | Status: DC

## 2020-10-07 NOTE — Telephone Encounter (Signed)
Future visit in 1 month  

## 2020-10-07 NOTE — Telephone Encounter (Signed)
Medication Refill - Medication: hydrochlorothiazide (HYDRODIURIL) 25 MG tablet (patient scheduled for 11/08/2020 for a follow up)   Has the patient contacted their pharmacy? Yes.    (Agent: If yes, when and what did the pharmacy advise?) She was in need of refills  Preferred Pharmacy (with phone number or street name):  [place], [address], [address] Phone:  [phone]  Fax:  [phone]       Agent: Please be advised that RX refills may take up to 3 business days. We ask that you follow-up with your pharmacy.

## 2020-11-02 ENCOUNTER — Encounter: Payer: Self-pay | Admitting: Family Medicine

## 2020-11-08 ENCOUNTER — Ambulatory Visit: Payer: Medicare Other | Admitting: Family Medicine

## 2020-11-23 ENCOUNTER — Other Ambulatory Visit: Payer: Self-pay

## 2020-11-23 ENCOUNTER — Telehealth: Payer: Self-pay

## 2020-11-23 DIAGNOSIS — E042 Nontoxic multinodular goiter: Secondary | ICD-10-CM

## 2020-11-23 NOTE — Telephone Encounter (Signed)
Message left for the patient to call us about scheduling her Thyroid ultrasound at [facility].

## 2020-11-30 ENCOUNTER — Other Ambulatory Visit: Payer: Self-pay

## 2020-11-30 ENCOUNTER — Encounter: Payer: Self-pay | Admitting: Unknown Physician Specialty

## 2020-11-30 ENCOUNTER — Ambulatory Visit (INDEPENDENT_AMBULATORY_CARE_PROVIDER_SITE_OTHER): Payer: Medicare Other | Admitting: Unknown Physician Specialty

## 2020-11-30 VITALS — BP 138/82 | HR 80 | Temp 97.9°F | Resp 16 | Ht 62.0 in | Wt 182.0 lb

## 2020-11-30 DIAGNOSIS — R5383 Other fatigue: Secondary | ICD-10-CM

## 2020-11-30 DIAGNOSIS — E782 Mixed hyperlipidemia: Secondary | ICD-10-CM

## 2020-11-30 DIAGNOSIS — K219 Gastro-esophageal reflux disease without esophagitis: Secondary | ICD-10-CM | POA: Diagnosis not present

## 2020-11-30 DIAGNOSIS — I10 Essential (primary) hypertension: Secondary | ICD-10-CM | POA: Diagnosis not present

## 2020-11-30 DIAGNOSIS — G252 Other specified forms of tremor: Secondary | ICD-10-CM

## 2020-11-30 NOTE — Progress Notes (Signed)
BP 138/82   Pulse 80   Temp 97.9 F (36.6 C)   Resp 16   Ht 5\' 2"  (1.575 m)   Wt 182 lb (82.6 kg)   SpO2 99%   BMI 33.29 kg/m    Subjective:    Patient ID: Patricia Richardson, female    DOB: 1951/04/29, 70 y.o.   MRN: [id number]  HPI: Patricia Richardson is a 70 y.o. female  Chief Complaint  Patient presents with   Follow-up   Hyperlipidemia   Hypertension   Gastroesophageal Reflux   CC today is of fatigue.  Going on for months.  Goes to bed tired and wakes up tired.  Not sure if she snores at night.  Last lab draw her TSH was low instead of elevated.  She was referred to [facility] who did an [place].  She does have an appt to return.  Dx with multinodular goiter  Hypertension Using medications without difficulty Average home BPs: typically SBP is not over 140.  Typically in the low 100s   No problems or lightheadedness No chest pain with exertion or shortness of breath No Edema   Hyperlipidemia Using statin 2 days/week - Originally scheduled that way No Muscle aches  Diet compliance:Exercise: Walks some and mows yards  GERD Under control with present medications.    Depression screen [facility] 2/9 11/30/2020 06/10/2020 05/25/2020 03/09/2020 03/04/2020  Decreased Interest 0 0 0 0 0  Down, Depressed, Hopeless 0 0 0 0 0  PHQ - 2 Score 0 0 0 0 0  Altered sleeping 0 - 0 - -  Tired, decreased energy 0 - 0 - -  Change in appetite 0 - 0 - -  Feeling bad or failure about yourself  0 - 0 - -  Trouble concentrating 0 - 0 - -  Moving slowly or fidgety/restless 0 - 0 - -  Suicidal thoughts 0 - 0 - -  PHQ-9 Score 0 - - - -  Difficult doing work/chores Not difficult at all - - - -   Tremors/Parkinsons Following with neurology.  Pt states not diagnosed with Parkinson's at this time but being monitored.  Notes reviewed.     Relevant past medical, surgical, family and social history reviewed and updated as indicated. Interim medical history since our last visit reviewed. Allergies and medications  reviewed and updated.  Review of Systems  Per HPI unless specifically indicated above     Objective:    BP 138/82   Pulse 80   Temp 97.9 F (36.6 C)   Resp 16   Ht 5\' 2"  (1.575 m)   Wt 182 lb (82.6 kg)   SpO2 99%   BMI 33.29 kg/m   Wt Readings from Last 3 Encounters:  11/30/20 182 lb (82.6 kg)  09/29/20 178 lb 4.8 oz (80.9 kg)  09/15/20 181 lb 3.2 oz (82.2 kg)    Physical Exam Constitutional:      General: She is not in acute distress.    Appearance: Normal appearance. She is well-developed.  HENT:     Head: Normocephalic and atraumatic.  Eyes:     General: Lids are normal. No scleral icterus.       Right eye: No discharge.        Left eye: No discharge.     Conjunctiva/sclera: Conjunctivae normal.  Neck:     Vascular: No carotid bruit or JVD.  Cardiovascular:     Rate and Rhythm: Normal rate and regular rhythm.  Heart sounds: Normal heart sounds.  Pulmonary:     Effort: Pulmonary effort is normal. No respiratory distress.     Breath sounds: Normal breath sounds.  Abdominal:     Palpations: There is no hepatomegaly or splenomegaly.  Musculoskeletal:        General: Normal range of motion.     Cervical back: Normal range of motion and neck supple.  Skin:    General: Skin is warm and dry.     Coloration: Skin is not pale.     Findings: No rash.  Neurological:     Mental Status: She is alert and oriented to person, place, and time.     Comments: Left arm tremor  Psychiatric:        Behavior: Behavior normal.        Thought Content: Thought content normal.        Judgment: Judgment normal.    Results for orders placed or performed in visit on 06/10/20  CBC with Differential/Platelet  Result Value Ref Range   WBC 8.2 3.8 - 10.8 Thousand/uL   RBC 4.99 3.80 - 5.10 Million/uL   Hemoglobin 14.8 11.7 - 15.5 g/dL   HCT 43.8 35.0 - 45.0 %   MCV 87.8 80.0 - 100.0 fL   MCH 29.7 27.0 - 33.0 pg   MCHC 33.8 32.0 - 36.0 g/dL   RDW 12.9 11.0 - 15.0 %    Platelets 425 (H) 140 - 400 Thousand/uL   MPV 10.3 7.5 - 12.5 fL   Neutro Abs 6,109 1,500 - 7,800 cells/uL   Lymphs Abs 1,517 850 - 3,900 cells/uL   Absolute Monocytes 459 200 - 950 cells/uL   Eosinophils Absolute 74 15 - 500 cells/uL   Basophils Absolute 41 0 - 200 cells/uL   Neutrophils Relative % 74.5 %   Total Lymphocyte 18.5 %   Monocytes Relative 5.6 %   Eosinophils Relative 0.9 %   Basophils Relative 0.5 %  Thyroid Panel With TSH  Result Value Ref Range   T3 Uptake 31 22 - 35 %   T4, Total 7.9 5.1 - 11.9 mcg/dL   Free Thyroxine Index 2.4 1.4 - 3.8   TSH 0.29 (L) 0.40 - 4.50 mIU/L  BASIC METABOLIC PANEL WITH GFR  Result Value Ref Range   Glucose, Bld 93 65 - 99 mg/dL   BUN 14 7 - 25 mg/dL   Creat 0.80 0.60 - 0.93 mg/dL   GFR, Est Non African American 74 > OR = 60 mL/min/1.44m2   GFR, Est African American 86 > OR = 60 mL/min/1.74m2   BUN/Creatinine Ratio NOT APPLICABLE 6 - 22 (calc)   Sodium 142 135 - 146 mmol/L   Potassium 4.9 3.5 - 5.3 mmol/L   Chloride 103 98 - 110 mmol/L   CO2 28 20 - 32 mmol/L   Calcium 10.4 8.6 - 10.4 mg/dL      Assessment & Plan:   Problem List Items Addressed This Visit       Unprioritized   Essential hypertension    Stable, continue present medications.         Relevant Orders   COMPLETE METABOLIC PANEL WITH GFR   Gastroesophageal reflux disease without esophagitis    Stable, continue present medications.         Mixed hyperlipidemia    Check labs today       Relevant Orders   COMPLETE METABOLIC PANEL WITH GFR   Lipid panel   Resting tremor    Per  Dr. Melrose Nakayama       Other Visit Diagnoses     Fatigue, unspecified type    -  Primary   Continue f/u with Dr. Gabriel Carina.  Recheck labs.Schedule sleep study   Relevant Orders   Nocturnal polysomnography (NPSG)   CBC with Differential/Platelet   COMPLETE METABOLIC PANEL WITH GFR   Hemoglobin A1c   Thyroid Panel With TSH        Follow up plan: Return for  physical.

## 2020-11-30 NOTE — Assessment & Plan Note (Signed)
Per Dr. Melrose Nakayama

## 2020-11-30 NOTE — Assessment & Plan Note (Signed)
Stable, continue present medications.   

## 2020-11-30 NOTE — Assessment & Plan Note (Signed)
Check labs today.

## 2020-12-01 LAB — CBC WITH DIFFERENTIAL/PLATELET
Absolute Monocytes: 347 cells/uL (ref 200–950)
Basophils Absolute: 39 cells/uL (ref 0–200)
Basophils Relative: 0.5 %
Eosinophils Absolute: 62 cells/uL (ref 15–500)
Eosinophils Relative: 0.8 %
HCT: 43.2 % (ref 35.0–45.0)
Hemoglobin: 14.7 g/dL (ref 11.7–15.5)
Lymphs Abs: 1417 cells/uL (ref 850–3900)
MCH: 29.8 pg (ref 27.0–33.0)
MCHC: 34 g/dL (ref 32.0–36.0)
MCV: 87.6 fL (ref 80.0–100.0)
MPV: 10.5 fL (ref 7.5–12.5)
Monocytes Relative: 4.5 %
Neutro Abs: 5837 cells/uL (ref 1500–7800)
Neutrophils Relative %: 75.8 %
Platelets: 399 10*3/uL (ref 140–400)
RBC: 4.93 10*6/uL (ref 3.80–5.10)
RDW: 13.1 % (ref 11.0–15.0)
Total Lymphocyte: 18.4 %
WBC: 7.7 10*3/uL (ref 3.8–10.8)

## 2020-12-01 LAB — COMPLETE METABOLIC PANEL WITH GFR
AG Ratio: 1.6 (calc) (ref 1.0–2.5)
ALT: 10 U/L (ref 6–29)
AST: 12 U/L (ref 10–35)
Albumin: 4.3 g/dL (ref 3.6–5.1)
Alkaline phosphatase (APISO): 73 U/L (ref 37–153)
BUN: 16 mg/dL (ref 7–25)
CO2: 23 mmol/L (ref 20–32)
Calcium: 9.7 mg/dL (ref 8.6–10.4)
Chloride: 105 mmol/L (ref 98–110)
Creat: 0.66 mg/dL (ref 0.60–0.93)
GFR, Est African American: 102 mL/min/{1.73_m2} (ref >=60)
GFR, Est Non African American: 88 mL/min/{1.73_m2} (ref >=60)
Globulin: 2.7 g/dL (calc) (ref 1.9–3.7)
Glucose, Bld: 82 mg/dL (ref 65–99)
Potassium: 4.2 mmol/L (ref 3.5–5.3)
Sodium: 141 mmol/L (ref 135–146)
Total Bilirubin: 0.5 mg/dL (ref 0.2–1.2)
Total Protein: 7 g/dL (ref 6.1–8.1)

## 2020-12-01 LAB — LIPID PANEL
Cholesterol: 147 mg/dL (ref <200)
HDL: 59 mg/dL (ref >=50)
LDL Cholesterol (Calc): 68 mg/dL (calc)
Non-HDL Cholesterol (Calc): 88 mg/dL (calc) (ref <130)
Total CHOL/HDL Ratio: 2.5 (calc) (ref <5.0)
Triglycerides: 112 mg/dL (ref <150)

## 2020-12-01 LAB — HEMOGLOBIN A1C
Hgb A1c MFr Bld: 5.7 % of total Hgb — ABNORMAL HIGH (ref <5.7)
Mean Plasma Glucose: 117 mg/dL
eAG (mmol/L): 6.5 mmol/L

## 2020-12-01 LAB — THYROID PANEL WITH TSH
Free Thyroxine Index: 2.2 (ref 1.4–3.8)
T3 Uptake: 31 % (ref 22–35)
T4, Total: 7.2 ug/dL (ref 5.1–11.9)
TSH: 0.22 mIU/L — ABNORMAL LOW (ref 0.40–4.50)

## 2020-12-21 NOTE — Telephone Encounter (Signed)
Spoke with Dr Joycie Peek office and they will call her to schedule her Thyroid Ultrasound.

## 2021-01-04 ENCOUNTER — Encounter: Payer: Medicare Other | Admitting: Unknown Physician Specialty

## 2021-01-13 ENCOUNTER — Ambulatory Visit: Payer: Medicare Other

## 2021-01-13 ENCOUNTER — Other Ambulatory Visit: Payer: Self-pay

## 2021-01-13 DIAGNOSIS — Z8673 Personal history of transient ischemic attack (TIA), and cerebral infarction without residual deficits: Secondary | ICD-10-CM

## 2021-01-20 MED ORDER — ATORVASTATIN CALCIUM 10 MG PO TABS: ORAL_TABLET | ORAL | 3 refills | Status: DC

## 2021-01-27 ENCOUNTER — Ambulatory Visit: Payer: Medicare Other | Admitting: General Surgery

## 2021-02-14 ENCOUNTER — Telehealth: Payer: Self-pay | Admitting: Internal Medicine

## 2021-02-14 MED ORDER — AMLODIPINE BESYLATE 10 MG PO TABS: 10.0000 mg | ORAL_TABLET | Freq: Every day | ORAL | 0 refills | Status: DC

## 2021-02-14 NOTE — Telephone Encounter (Signed)
Medication Refill - Medication:  amLODipine (NORVASC) 10 MG tablet 90 tablet 3 04/06/2020    Sig - Route: Take 1 tablet (10 mg total) by mouth daily. - Oral   Sent to pharmacy as: amLODipine (NORVASC) 10 MG tablet   E-Prescribing Status: Receipt confirmed by pharmacy (04/06/2020 12:16 PM EDT)     Has the patient contacted their pharmacy? Yes.   (Agent: If no, request that the patient contact the pharmacy for the refill.) (Agent: If yes, when and what did the pharmacy advise?) call dr/ Pt pcp no longer available/ last seen 6/28/ Julian Hy for med refills   Preferred Pharmacy (with phone number or street name):  [facility]  ([place], [place] [facility]  [address] [place] [address] [place] [address]  Phone: [phone] Fax: [phone]  Hours: Not open 24 hours    Agent: Please be advised that RX refills may take up to 3 business days. We ask that you follow-up with your pharmacy.

## 2021-03-08 ENCOUNTER — Ambulatory Visit: Payer: Medicare Other

## 2021-03-14 ENCOUNTER — Other Ambulatory Visit: Payer: Self-pay

## 2021-03-14 ENCOUNTER — Encounter: Payer: Self-pay | Admitting: Nurse Practitioner

## 2021-03-14 ENCOUNTER — Ambulatory Visit (INDEPENDENT_AMBULATORY_CARE_PROVIDER_SITE_OTHER): Payer: Medicare Other | Admitting: Nurse Practitioner

## 2021-03-14 VITALS — BP 132/74 | HR 88 | Temp 98.4°F | Resp 18 | Ht 62.0 in | Wt 179.1 lb

## 2021-03-14 DIAGNOSIS — Z1231 Encounter for screening mammogram for malignant neoplasm of breast: Secondary | ICD-10-CM

## 2021-03-14 DIAGNOSIS — F419 Anxiety disorder, unspecified: Secondary | ICD-10-CM

## 2021-03-14 DIAGNOSIS — E782 Mixed hyperlipidemia: Secondary | ICD-10-CM | POA: Diagnosis not present

## 2021-03-14 DIAGNOSIS — K219 Gastro-esophageal reflux disease without esophagitis: Secondary | ICD-10-CM

## 2021-03-14 DIAGNOSIS — D751 Secondary polycythemia: Secondary | ICD-10-CM

## 2021-03-14 DIAGNOSIS — E042 Nontoxic multinodular goiter: Secondary | ICD-10-CM

## 2021-03-14 DIAGNOSIS — E669 Obesity, unspecified: Secondary | ICD-10-CM

## 2021-03-14 DIAGNOSIS — I10 Essential (primary) hypertension: Secondary | ICD-10-CM | POA: Diagnosis not present

## 2021-03-14 DIAGNOSIS — Z0000 Encounter for general adult medical examination without abnormal findings: Secondary | ICD-10-CM | POA: Diagnosis not present

## 2021-03-14 DIAGNOSIS — I739 Peripheral vascular disease, unspecified: Secondary | ICD-10-CM

## 2021-03-14 DIAGNOSIS — Z23 Encounter for immunization: Secondary | ICD-10-CM

## 2021-03-14 DIAGNOSIS — R7303 Prediabetes: Secondary | ICD-10-CM

## 2021-03-14 DIAGNOSIS — E876 Hypokalemia: Secondary | ICD-10-CM

## 2021-03-14 NOTE — Progress Notes (Signed)
Name: Patricia Richardson   MRN: [id number]    DOB: Nov 08, 1950   Date:03/14/2021       Progress Note  Subjective  Chief Complaint  Chief Complaint  Patient presents with   Annual Exam    HPI  Patient presents for annual CPE, here alone  Diet: Well balanced, lean meats, vegetables, and fruit. some dairy Exercise: walk, work in the yard, discussed 150 min weekly physical activity recommendations  [place] Office Visit from 03/14/2021 in [facility]  AUDIT-C Score 0      Depression: Phq 9 is  negative Depression screen [facility] 2/9 03/14/2021 11/30/2020 06/10/2020 05/25/2020 03/09/2020  Decreased Interest 0 0 0 0 0  Down, Depressed, Hopeless 0 0 0 0 0  PHQ - 2 Score 0 0 0 0 0  Altered sleeping 0 0 - 0 -  Tired, decreased energy 0 0 - 0 -  Change in appetite 0 0 - 0 -  Feeling bad or failure about yourself  0 0 - 0 -  Trouble concentrating 0 0 - 0 -  Moving slowly or fidgety/restless 0 0 - 0 -  Suicidal thoughts 0 0 - 0 -  PHQ-9 Score 0 0 - - -  Difficult doing work/chores Not difficult at all Not difficult at all - - -  Some recent data might be hidden   Hypertension: BP Readings from Last 3 Encounters:  03/14/21 132/74  11/30/20 138/82  09/29/20 133/71   Obesity: Wt Readings from Last 3 Encounters:  03/14/21 179 lb 1.6 oz (81.2 kg)  11/30/20 182 lb (82.6 kg)  09/29/20 178 lb 4.8 oz (80.9 kg)   BMI Readings from Last 3 Encounters:  03/14/21 32.76 kg/m  11/30/20 33.29 kg/m  09/29/20 32.61 kg/m     Vaccines:  Shingrix: 53-64 yo and ask insurance if covered when patient above 60 yo, declined Pneumonia: educated and discussed with patient. Flu: educated and discussed with patient, getting it today  Hep C Screening: 11/2018 Intimate partner violence:none Sexual History : no Menstrual History/LMP/Abnormal Bleeding: discussed that any bleeding is abnormal Incontinence Symptoms: none  Breast cancer:  - Last Mammogram: 03/22/2020, ordered - BRCA  gene screening: not done  Osteoporosis: Discussed high calcium and vitamin D supplementation, weight bearing exercises  Cervical cancer screening: hysterectomy  Skin cancer: Discussed monitoring for atypical lesions  Colorectal cancer: 03/2019 Lung cancer:  Low Dose CT Chest recommended if Age 80-80 years, 20 pack-year currently smoking OR have quit w/in 15years. Patient does not qualify.   ECG: 03/2018  Advanced Care Planning: A voluntary discussion about advance care planning including the explanation and discussion of advance directives.  Discussed health care proxy and Living will, and the patient was able to identify a health care proxy as son.  Lipids: Lab Results  Component Value Date   CHOL 147 11/30/2020   CHOL 149 05/25/2020   CHOL 144 04/10/2019   Lab Results  Component Value Date   HDL 59 11/30/2020   HDL 55 05/25/2020   HDL 57 04/10/2019   Lab Results  Component Value Date   LDLCALC 68 11/30/2020   LDLCALC 75 05/25/2020   LDLCALC 69 04/10/2019   Lab Results  Component Value Date   TRIG 112 11/30/2020   TRIG 107 05/25/2020   TRIG 98 04/10/2019   Lab Results  Component Value Date   CHOLHDL 2.5 11/30/2020   CHOLHDL 2.7 05/25/2020   CHOLHDL 2.5 04/10/2019   No results found for: LDLDIRECT  Glucose:  Glucose, Bld  Date Value Ref Range Status  11/30/2020 82 65 - 99 mg/dL Final    Comment:    .            Fasting reference interval .   06/10/2020 93 65 - 99 mg/dL Final    Comment:    .            Fasting reference interval .   05/25/2020 98 65 - 99 mg/dL Final    Comment:    .            Fasting reference interval .    Glucose-Capillary  Date Value Ref Range Status  04/09/2018 138 (H) 70 - 99 mg/dL Final    Patient Active Problem List   Diagnosis Date Noted   Parkinsonism ([place]) 09/12/2020   Polycythemia 06/10/2020   Difficulty walking 06/08/2020   History of anxiety 06/08/2020   Tremor 04/08/2020   Mixed hyperlipidemia 08/28/2019    Dependent edema 08/28/2019   Leg pain 07/15/2019   DJD (degenerative joint disease) 07/15/2019   Gastroesophageal reflux disease without esophagitis 04/01/2019   Multiple thyroid nodules 04/01/2019   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   Current use of proton pump inhibitor 09/23/2018   Prediabetes 06/20/2018   At risk for heart disease 06/20/2018   Essential hypertension 06/17/2018   Hx of completed stroke 06/17/2018   Obesity (BMI 35.0-39.9 without comorbidity) 06/17/2018   Status post total hip replacement, 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   Primary osteoarthritis of right hip 03/12/2017   Dyspnea on exertion 03/01/2015   Anxiety 07/29/2014   Bleeding hemorrhoids 07/29/2014    Past Surgical History:  Procedure Laterality Date   ABDOMINAL HYSTERECTOMY     BREAST BIOPSY Right 11/03/2005   core biopsy w/ clip placement   CATARACT EXTRACTION W/PHACO Right 09/15/2020   Procedure: CATARACT EXTRACTION PHACO AND INTRAOCULAR LENS PLACEMENT ([place]) RIGHT;  Surgeon: Leandrew Koyanagi, MD;  Location: [place];  Service: Ophthalmology;  Laterality: Right;  4.70 0:57.4 8.2%   CATARACT EXTRACTION W/PHACO Left 09/29/2020   Procedure: CATARACT EXTRACTION PHACO AND INTRAOCULAR LENS PLACEMENT ([facility]) LEFT 3.01 00:45.7 6.6%;  Surgeon: Leandrew Koyanagi, MD;  Location: [place];  Service: Ophthalmology;  Laterality: Left;   CHOLECYSTECTOMY     COLONOSCOPY     COLONOSCOPY WITH PROPOFOL N/A 11/05/2015   Procedure: COLONOSCOPY WITH PROPOFOL;  Surgeon: Lollie Sails, MD;  Location: [facility] END[facility]COPY;  Service: Endoscopy;  Laterality: N/A;   KNEE ARTHROSCOPY Right 02/21/08   TOTAL HIP ARTHROPLASTY Right 04/09/2018   Procedure: TOTAL HIP ARTHROPLASTY ANTERIOR APPROACH;  Surgeon: Hessie Knows, MD;  Location: [facility];  Service: Orthopedics;  Laterality: Right;   WISDOM TOOTH EXTRACTION      Family History  Problem Relation Age of Onset   Breast cancer Maternal Aunt        great   Kidney disease Mother     Diabetes Mother    Hypertension Father    Kidney disease Father    Diabetes Brother    Diabetes Paternal Grandmother     Social History   Socioeconomic History   Marital status: Divorced    Spouse name: Not on file   Number of children: 2   Years of education: 12   Highest education level: Not on file  Occupational History   Not on file  Tobacco Use   Smoking status: Never   Smokeless tobacco: Never  Vaping Use   Vaping Use: Never used  Substance and Sexual Activity   Alcohol  use: Yes    Comment: rarely   Drug use: No   Sexual activity: Not Currently  Other Topics Concern   Not on file  Social History Narrative   Pt lives alone   Social Determinants of Health   Financial Resource Strain: Low Risk    Difficulty of Paying Living Expenses: Not hard at all  Food Insecurity: No Food Insecurity   Worried About Charity fundraiser in the Last Year: Never true   [place] in the Last Year: Never true  Transportation Needs: No Transportation Needs   Lack of Transportation (Medical): No   Lack of Transportation (Non-Medical): No  Physical Activity: Inactive   Days of Exercise per Week: 0 days   Minutes of Exercise per Session: 0 min  Stress: No Stress Concern Present   Feeling of Stress : Not at all  Social Connections: Moderately Isolated   Frequency of Communication with Friends and Family: Once a week   Frequency of Social Gatherings with Friends and Family: Once a week   Attends Religious Services: 1 to 4 times per year   Active Member of [employer] or Organizations: No   Attends Music therapist: 1 to 4 times per year   Marital Status: Divorced  Human resources officer Violence: Not At Risk   Fear of Current or Ex-Partner: No   Emotionally Abused: No   Physically Abused: No   Sexually Abused: No     Current Outpatient Medications:    acetaminophen (TYLENOL) 500 MG tablet, Take 1-2 tablets (500-1,000 mg total) by mouth every 6 (six) hours as needed for  mild pain (pain score 1-3 or temp > 100.5)., Disp: , Rfl:    amLODipine (NORVASC) 10 MG tablet, Take 1 tablet (10 mg total) by mouth daily., Disp: 90 tablet, Rfl: 0   aspirin EC 81 MG tablet, Take 1 tablet (81 mg total) by mouth daily., Disp: 90 tablet, Rfl: 3   atorvastatin (LIPITOR) 10 MG tablet, TAKE 1 TABLET BY MOUTH AT BEDTIME 2 NIGHTS PER WEEK, Disp: 26 tablet, Rfl: 3   carbidopa-levodopa (SINEMET IR) 25-100 MG tablet, Take by mouth., Disp: , Rfl:    gabapentin (NEURONTIN) 100 MG capsule, Take by mouth., Disp: , Rfl:    hydrochlorothiazide (HYDRODIURIL) 25 MG tablet, Take 1 tablet (25 mg total) by mouth daily., Disp: 90 tablet, Rfl: 1   omeprazole (PRILOSEC) 20 MG capsule, Take 1 capsule (20 mg total) by mouth every other day., Disp: 45 capsule, Rfl: 3   OVER THE COUNTER MEDICATION, Take 1 tablet by mouth daily as needed (constipation). Swiss Kriss herbal laxative, Disp: , Rfl:   Allergies  Allergen Reactions   Codeine Itching    Pin and needles      ROS  Constitutional: Negative for fever or weight change.  Respiratory: Negative for cough and shortness of breath.   Cardiovascular: Negative for chest pain or palpitations.  Gastrointestinal: Negative for abdominal pain, no bowel changes.  Musculoskeletal: Negative for gait problem or joint swelling.  Skin: Negative for rash.  Neurological: Negative for dizziness or headache.  No other specific complaints in a complete review of systems (except as listed in HPI above).   Objective  Vitals:   03/14/21 1335  BP: 132/74  Pulse: 88  Resp: 18  Temp: 98.4 F (36.9 C)  TempSrc: Oral  SpO2: 98%  Weight: 179 lb 1.6 oz (81.2 kg)  Height: _0  (1.575 m)    Body mass index is 32.76 kg/m.  Physical Exam  Constitutional: Patient appears well-developed and well-nourished. No distress.  HENT: Head: Normocephalic and atraumatic. Ears: B TMs ok, no erythema or effusion; Nose: Nose normal. Mouth/Throat: Not done Eyes: Conjunctivae  and EOM are normal. Pupils are equal, round, and reactive to light. No scleral icterus.  Neck: Normal range of motion. Neck supple. No JVD present. No thyromegaly present.  Cardiovascular: Normal rate, regular rhythm and normal heart sounds.  No murmur heard. No BLE edema. Pulmonary/Chest: Effort normal and breath sounds normal. No respiratory distress. Abdominal: Soft. Bowel sounds are normal, no distension. There is no tenderness. no masses Breast: no lumps or masses, no nipple discharge or rashes FEMALE GENITALIA: not done RECTAL: not done Musculoskeletal: Normal range of motion, no joint effusions. No gross deformities Neurological: he is alert and oriented to person, place, and time. No cranial nerve deficit. Coordination, balance, strength, speech and gait are normal.  Skin: Skin is warm and dry. No rash noted. No erythema.  Psychiatric: Patient has a normal mood and affect. behavior is normal. Judgment and thought content normal.   No results found for this or any previous visit (from the past 2160 hour(s)).    Fall Risk: Fall Risk  03/14/2021 11/30/2020 06/10/2020 05/25/2020 03/09/2020  Falls in the past year? 0 0 - 0 0  Number falls in past yr: 0 0 0 0 0  Injury with Fall? 0 0 0 0 -  Risk for fall due to : - - - - -  Follow up Falls evaluation completed - - - -    Functional Status Survey: Is the patient deaf or have difficulty hearing?: No Does the patient have difficulty seeing, even when wearing glasses/contacts?: No Does the patient have difficulty concentrating, remembering, or making decisions?: No Does the patient have difficulty walking or climbing stairs?: No Does the patient have difficulty dressing or bathing?: No Does the patient have difficulty doing errands alone such as visiting a doctor's office or shopping?: No  Assessment & Plan  1. Annual physical exam  - COMPLETE METABOLIC PANEL WITH GFR - Lipid panel - Hemoglobin A1c - TSH - CBC with  Differential/Platelet  2. Essential hypertension  - COMPLETE METABOLIC PANEL WITH GFR - Lipid panel - CBC with Differential/Platelet  3. Mixed hyperlipidemia  - COMPLETE METABOLIC PANEL WITH GFR - Lipid panel  4. Gastroesophageal reflux disease without esophagitis   5. Polycythemia   6. Hypokalemia   7. Multiple thyroid nodules  - TSH  8. Obesity (BMI 35.0-39.9 without comorbidity)  - COMPLETE METABOLIC PANEL WITH GFR - Lipid panel - Hemoglobin A1c - TSH - CBC with Differential/Platelet  9. Prediabetes  - Hemoglobin A1c  10. Anxiety   11. PAD (peripheral artery disease) ([place])   12. Encounter for screening mammogram for malignant neoplasm of breast  - MM 3D SCREEN BREAST BILATERAL; Future  13. Need for influenza vaccination  - Flu Vaccine QUAD High Dose(Fluad)   -USPSTF grade A and B recommendations reviewed with patient; age-appropriate recommendations, preventive care, screening tests, etc discussed and encouraged; healthy living encouraged; see AVS for patient education given to patient -Discussed importance of 150 minutes of physical activity weekly, eat two servings of fish weekly, eat one serving of tree nuts ( cashews, pistachios, pecans, almonds.Marland Kitchen) every other day, eat 6 servings of fruit/vegetables daily and drink plenty of water and avoid sweet beverages.

## 2021-03-15 LAB — COMPLETE METABOLIC PANEL WITH GFR
AG Ratio: 1.6 (calc) (ref 1.0–2.5)
ALT: 17 U/L (ref 6–29)
AST: 15 U/L (ref 10–35)
Albumin: 4.6 g/dL (ref 3.6–5.1)
Alkaline phosphatase (APISO): 74 U/L (ref 37–153)
BUN: 14 mg/dL (ref 7–25)
CO2: 24 mmol/L (ref 20–32)
Calcium: 10.2 mg/dL (ref 8.6–10.4)
Chloride: 102 mmol/L (ref 98–110)
Creat: 0.77 mg/dL (ref 0.60–1.00)
Globulin: 2.8 g/dL (calc) (ref 1.9–3.7)
Glucose, Bld: 141 mg/dL — ABNORMAL HIGH (ref 65–99)
Potassium: 3.6 mmol/L (ref 3.5–5.3)
Sodium: 138 mmol/L (ref 135–146)
Total Bilirubin: 0.6 mg/dL (ref 0.2–1.2)
Total Protein: 7.4 g/dL (ref 6.1–8.1)
eGFR: 82 mL/min/{1.73_m2} (ref >=60)

## 2021-03-15 LAB — CBC WITH DIFFERENTIAL/PLATELET
Absolute Monocytes: 504 cells/uL (ref 200–950)
Basophils Absolute: 36 cells/uL (ref 0–200)
Basophils Relative: 0.4 %
Eosinophils Absolute: 27 cells/uL (ref 15–500)
Eosinophils Relative: 0.3 %
HCT: 43.4 % (ref 35.0–45.0)
Hemoglobin: 14.9 g/dL (ref 11.7–15.5)
Lymphs Abs: 1629 cells/uL (ref 850–3900)
MCH: 30.3 pg (ref 27.0–33.0)
MCHC: 34.3 g/dL (ref 32.0–36.0)
MCV: 88.2 fL (ref 80.0–100.0)
MPV: 10.2 fL (ref 7.5–12.5)
Monocytes Relative: 5.6 %
Neutro Abs: 6804 cells/uL (ref 1500–7800)
Neutrophils Relative %: 75.6 %
Platelets: 393 10*3/uL (ref 140–400)
RBC: 4.92 10*6/uL (ref 3.80–5.10)
RDW: 13 % (ref 11.0–15.0)
Total Lymphocyte: 18.1 %
WBC: 9 10*3/uL (ref 3.8–10.8)

## 2021-03-15 LAB — LIPID PANEL
Cholesterol: 155 mg/dL (ref <200)
HDL: 59 mg/dL (ref >=50)
LDL Cholesterol (Calc): 75 mg/dL (calc)
Non-HDL Cholesterol (Calc): 96 mg/dL (calc) (ref <130)
Total CHOL/HDL Ratio: 2.6 (calc) (ref <5.0)
Triglycerides: 130 mg/dL (ref <150)

## 2021-03-15 LAB — HEMOGLOBIN A1C
Hgb A1c MFr Bld: 5.7 % of total Hgb — ABNORMAL HIGH (ref <5.7)
Mean Plasma Glucose: 117 mg/dL
eAG (mmol/L): 6.5 mmol/L

## 2021-03-15 LAB — TSH: TSH: 0.2 mIU/L — ABNORMAL LOW (ref 0.40–4.50)

## 2021-03-22 ENCOUNTER — Ambulatory Visit: Payer: Medicare Other

## 2021-03-24 ENCOUNTER — Encounter: Payer: Self-pay | Admitting: General Surgery

## 2021-04-01 ENCOUNTER — Other Ambulatory Visit: Payer: Self-pay

## 2021-04-01 ENCOUNTER — Ambulatory Visit
Admission: RE | Admit: 2021-04-01 | Discharge: 2021-04-01 | Disposition: A | Discharge status: Home / Self Care | Payer: Medicare Other | Source: Ambulatory Visit | Attending: Nurse Practitioner | Admitting: Nurse Practitioner

## 2021-04-01 DIAGNOSIS — Z1231 Encounter for screening mammogram for malignant neoplasm of breast: Secondary | ICD-10-CM | POA: Diagnosis present

## 2021-04-05 ENCOUNTER — Ambulatory Visit (INDEPENDENT_AMBULATORY_CARE_PROVIDER_SITE_OTHER): Payer: Medicare Other

## 2021-04-05 ENCOUNTER — Other Ambulatory Visit: Payer: Self-pay

## 2021-04-05 VITALS — BP 130/78 | HR 88 | Temp 98.0°F | Resp 16 | Ht 62.0 in | Wt 180.9 lb

## 2021-04-05 DIAGNOSIS — Z0000 Encounter for general adult medical examination without abnormal findings: Secondary | ICD-10-CM

## 2021-04-05 NOTE — Progress Notes (Signed)
Subjective:   Patricia Richardson is a 70 y.o. female who presents for Medicare Annual (Subsequent) preventive examination.  Review of Systems     Cardiac Risk Factors include: advanced age (>30men, >20 women);dyslipidemia;hypertension;obesity (BMI >30kg/m2)     Objective:    Today's Vitals   04/05/21 0855  BP: 130/78  Pulse: 88  Resp: 16  Temp: 98 F (36.7 C)  TempSrc: Oral  SpO2: 95%  Weight: 180 lb 14.4 oz (82.1 kg)  Height: 5\' 2"  (1.575 m)   Body mass index is 33.09 kg/m.  Advanced Directives 04/05/2021 09/29/2020 09/15/2020 03/04/2020 10/29/2019 03/04/2019 04/09/2018  Does Patient Have a Medical Advance Directive? Yes Yes Yes Yes Yes Yes Yes  Type of Paramedic of [place];Living will [place];Living will [place];Living will [place];Living will Living will;Healthcare Power of [place];Living will [place];Living will  Does patient want to make changes to medical advance directive? - No - Patient declined No - Patient declined - - - No - Patient declined  Copy of [place] in Chart? No - copy requested No - copy requested Yes - validated most recent copy scanned in chart (See row information) No - copy requested - No - copy requested -    Current Medications (verified) Outpatient Encounter Medications as of 04/05/2021  Medication Sig   acetaminophen (TYLENOL) 500 MG tablet Take 1-2 tablets (500-1,000 mg total) by mouth every 6 (six) hours as needed for mild pain (pain score 1-3 or temp > 100.5).   amLODipine (NORVASC) 10 MG tablet Take 1 tablet (10 mg total) by mouth daily.   aspirin EC 81 MG tablet Take 1 tablet (81 mg total) by mouth daily.   atorvastatin (LIPITOR) 10 MG tablet TAKE 1 TABLET BY MOUTH AT BEDTIME 2 NIGHTS PER WEEK   carbidopa-levodopa (SINEMET IR) 25-100 MG tablet Take 1 tablet by mouth 3 (three) times daily.    gabapentin (NEURONTIN) 100 MG capsule Take 100 mg by mouth 4 (four) times daily.   hydrochlorothiazide (HYDRODIURIL) 25 MG tablet Take 1 tablet (25 mg total) by mouth daily.   omeprazole (PRILOSEC) 20 MG capsule Take 1 capsule (20 mg total) by mouth every other day.   OVER THE COUNTER MEDICATION Take 1 tablet by mouth daily as needed (constipation). Swiss Kriss herbal laxative   No facility-administered encounter medications on file as of 04/05/2021.    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   Past Surgical History:  Procedure Laterality Date   ABDOMINAL HYSTERECTOMY     BREAST BIOPSY Right 11/03/2005   core biopsy w/ clip placement   CATARACT EXTRACTION W/PHACO Right 09/15/2020   Procedure: CATARACT EXTRACTION PHACO AND INTRAOCULAR LENS PLACEMENT ([place]) RIGHT;  Surgeon: Leandrew Koyanagi, MD;  Location: [facility];  Service: Ophthalmology;  Laterality: Right;  4.70 0:57.4 8.2%   CATARACT EXTRACTION W/PHACO Left 09/29/2020   Procedure: CATARACT EXTRACTION PHACO AND INTRAOCULAR LENS PLACEMENT ([facility]) LEFT 3.01 00:45.7 6.6%;  Surgeon: Leandrew Koyanagi, MD;  Location: [facility];  Service: Ophthalmology;  Laterality: Left;   CHOLECYSTECTOMY  COLONOSCOPY     COLONOSCOPY WITH PROPOFOL N/A 11/05/2015   Procedure: COLONOSCOPY WITH PROPOFOL;  Surgeon: Lollie Sails, MD;  Location[facility] ENDOSCOPY;  Service: Endoscopy;  Laterality: N/A;   JOINT REPLACEMENT  04-09-2018   KNEE ARTHROSCOPY Right 02/21/2008   TOTAL HIP ARTHROPLASTY Right 04/09/2018   Procedure: TOTAL HIP ARTHROPLASTY ANTERIOR APPROACH;  Surgeon: Hessie Knows, MD;  Location: [facility];  Service: Orthopedics;   Laterality: Right;   WISDOM TOOTH EXTRACTION     Family History  Problem Relation Age of Onset   Breast cancer Maternal Aunt        great   Kidney disease Mother    Diabetes Mother    Hypertension Father    Kidney disease Father    Diabetes Brother    Diabetes Paternal Grandmother    Social History   Socioeconomic History   Marital status: Divorced    Spouse name: Not on file   Number of children: 2   Years of education: 12   Highest education level: Not on file  Occupational History   Not on file  Tobacco Use   Smoking status: Never   Smokeless tobacco: Never  Vaping Use   Vaping Use: Never used  Substance and Sexual Activity   Alcohol use: Yes    Comment: rarely   Drug use: No   Sexual activity: Not Currently  Other Topics Concern   Not on file  Social History Narrative   Pt lives alone   Social Determinants of Health   Financial Resource Strain: Low Risk    Difficulty of Paying Living Expenses: Not hard at all  Food Insecurity: No Food Insecurity   Worried About Charity fundraiser in the Last Year: Never true   [place] in the Last Year: Never true  Transportation Needs: No Transportation Needs   Lack of Transportation (Medical): No   Lack of Transportation (Non-Medical): No  Physical Activity: Sufficiently Active   Days of Exercise per Week: 3 days   Minutes of Exercise per Session: 60 min  Stress: No Stress Concern Present   Feeling of Stress : Not at all  Social Connections: Moderately Isolated   Frequency of Communication with Friends and Family: More than three times a week   Frequency of Social Gatherings with Friends and Family: Once a week   Attends Religious Services: More than 4 times per year   Active Member of [employer] or Organizations: No   Attends Music therapist: Never   Marital Status: Divorced    Tobacco Counseling Counseling given: Not Answered   Clinical Intake:  Pre-visit preparation completed: Yes  Pain  : No/denies pain     BMI - recorded: 33.09 Nutritional Status: BMI > 30  Obese Nutritional Risks: None Diabetes: No  How often do you need to have someone help you when you read instructions, pamphlets, or other written materials from your doctor or pharmacy?: 1 - Never    Interpreter Needed?: No  Information entered by :: Clemetine Marker LPN   Activities of Daily Living In your present state of health, do you have any difficulty performing the following activities: 04/05/2021 03/14/2021  Hearing? N N  Vision? N N  Difficulty concentrating or making decisions? N N  Walking or climbing stairs? N N  Dressing or bathing? N N  Doing errands, shopping? N N  Preparing Food and eating ? N -  Using the Toilet? N -  In the past six months, have  you accidently leaked urine? N -  Do you have problems with loss of bowel control? N -  Managing your Medications? N -  Managing your Finances? N -  Housekeeping or managing your Housekeeping? N -  Some recent data might be hidden    Patient Care Team: Delsa Grana, PA-C as PCP - General (Family Medicine) Samara Deist, DPM as Referring Physician (Podiatry) Anabel Bene, MD as Referring Physician (Neurology)  Indicate any recent Medical Services you may have received from other than [facility] providers in the past year (date may be approximate).     Assessment:   This is a routine wellness examination for [place].  Hearing/Vision screen Hearing Screening - Comments:: Pt denies hearing difficulty Vision Screening - Comments:: Annual vision screenings done by [facility]  Dietary issues and exercise activities discussed: Current Exercise Habits: Home exercise routine, Type of exercise: Other - see comments (yard work), Time (Minutes): 60, Frequency (Times/Week): 3, Weekly Exercise (Minutes/Week): 180, Intensity: Moderate, Exercise limited by: neurologic condition(s)   Goals Addressed             This Visit's Progress    DIET  - INCREASE WATER INTAKE       Recommend drinking 6-8 glasses of water per day      Increase physical activity   On track    Recommend increasing physical activity to at least 3 days per week       Depression Screen PHQ 2/9 Scores 04/05/2021 03/14/2021 11/30/2020 06/10/2020 05/25/2020 03/09/2020 03/04/2020  PHQ - 2 Score 0 0 0 0 0 0 0  PHQ- 9 Score - 0 0 - - - -    Fall Risk Fall Risk  04/05/2021 03/14/2021 11/30/2020 06/10/2020 05/25/2020  Falls in the past year? 0 0 0 - 0  Number falls in past yr: 0 0 0 0 0  Injury with Fall? 0 0 0 0 0  Risk for fall due to : No Fall Risks - - - -  Follow up Falls prevention discussed Falls evaluation completed - - -    FALL RISK PREVENTION PERTAINING TO THE HOME:  Any stairs in or around the home? Yes  If so, are there any without handrails? No  Home free of loose throw rugs in walkways, pet beds, electrical cords, etc? Yes  Adequate lighting in your home to reduce risk of falls? Yes   ASSISTIVE DEVICES UTILIZED TO PREVENT FALLS:  Life alert? No  Use of a cane, walker or w/c? No  Grab bars in the bathroom? Yes  Shower chair or bench in shower? Yes  Elevated toilet seat or a handicapped toilet? Yes   TIMED UP AND GO:  Was the test performed? Yes .  Length of time to ambulate 10 feet: 5 sec.   Gait steady and fast without use of assistive device  Cognitive Function: Normal cognitive status assessed by direct observation by this Nurse Health Advisor. No abnormalities found.       6CIT Screen 03/04/2019  What Year? 0 points  What month? 0 points  What time? 0 points  Count back from 20 0 points  Months in reverse 0 points  Repeat phrase 0 points  Total Score 0    Immunizations Immunization History  Administered Date(s) Administered   Fluad Quad(high Dose 65+) 02/06/2019, 02/17/2020, 03/14/2021   Influenza Split 03/01/2015   Influenza, High Dose Seasonal PF 04/10/2018   Influenza-Unspecified 03/06/2014, 03/06/2016, 03/13/2017    PFIZER(Purple Top)SARS-COV-2 Vaccination 07/31/2019, 08/26/2019  Pneumococcal Conjugate-13 02/06/2019   Pneumococcal Polysaccharide-23 02/25/2014, 03/13/2017   Tdap 03/01/2015   Zoster, Live 11/04/2015    TDAP status: Up to date  Flu Vaccine status: Up to date  Pneumococcal vaccine status: Up to date  Covid-19 vaccine status: Completed vaccines  Qualifies for Shingles Vaccine? Yes   Zostavax completed Yes   Shingrix Completed?: No.    Education has been provided regarding the importance of this vaccine. Patient has been advised to call insurance company to determine out of pocket expense if they have not yet received this vaccine. Advised may also receive vaccine at local pharmacy or Health Dept. Verbalized acceptance and understanding.  Screening Tests Health Maintenance  Topic Date Due   Zoster Vaccines- Shingrix (1 of 2) Never done   COVID-19 Vaccine (3 - Pfizer risk series) 09/23/2019   COLONOSCOPY (Pts 45-55yrs Insurance coverage will need to be confirmed)  03/27/2022   MAMMOGRAM  04/01/2022   DEXA SCAN  03/11/2024   TETANUS/TDAP  02/28/2025   Pneumonia Vaccine 33+ Years old  Completed   INFLUENZA VACCINE  Completed   Hepatitis C Screening  Completed   HPV VACCINES  Aged Out    Health Maintenance  Health Maintenance Due  Topic Date Due   Zoster Vaccines- Shingrix (1 of 2) Never done   COVID-19 Vaccine (3 - Pfizer risk series) 09/23/2019    Colorectal cancer screening: Type of screening: Colonoscopy. Completed 03/28/19. Repeat every 3 years  Mammogram status: Completed 04/01/21. Repeat every year  Bone Density status: Completed 03/12/19. Results reflect: Bone density results: NORMAL. Repeat every 3-5 years.  Lung Cancer Screening: (Low Dose CT Chest recommended if Age 48-80 years, 30 pack-year currently smoking OR have quit w/in 15years.) does not qualify.   Additional Screening:  Hepatitis C Screening: does qualify; Completed 11/04/18  Vision Screening:  Recommended annual ophthalmology exams for early detection of glaucoma and other disorders of the eye. Is the patient up to date with their annual eye exam?  Yes  Who is the provider or what is the name of the office in which the patient attends annual eye exams? .   Dental Screening: Recommended annual dental exams for proper oral hygiene  Community Resource Referral / Chronic Care Management: CRR required this visit?  No   CCM required this visit?  No      Plan:     I have personally reviewed and noted the following in the patient's chart:   Medical and social history Use of alcohol, tobacco or illicit drugs  Current medications and supplements including opioid prescriptions.  Functional ability and status Nutritional status Physical activity Advanced directives List of other physicians Hospitalizations, surgeries, and ER visits in previous 12 months Vitals Screenings to include cognitive, depression, and falls Referrals and appointments  In addition, I have reviewed and discussed with patient certain preventive protocols, quality metrics, and best practice recommendations. A written personalized care plan for preventive services as well as general preventive health recommendations were provided to patient.     Clemetine Marker, LPN   [id number]   Nurse Notes: pt states she is concerned about diagnosis of polycythemia on her chart due to possible insurance concerns. Pt requested removal; advised would need to be removed per PCP.

## 2021-04-05 NOTE — Patient Instructions (Signed)
Ms. Patricia Richardson , Thank you for taking time to come for your Medicare Wellness Visit. I appreciate your ongoing commitment to your health goals. Please review the following plan we discussed and let me know if I can assist you in the future.   Screening recommendations/referrals: Colonoscopy: done 03/28/19. Repeat 03/2022 Mammogram: done 04/01/21 Bone Density: done 03/12/19 Recommended yearly ophthalmology/optometry visit for glaucoma screening and checkup Recommended yearly dental visit for hygiene and checkup  Vaccinations: Influenza vaccine: done 03/14/21 Pneumococcal vaccine: done 02/06/19 Tdap vaccine: done 03/01/15 Shingles vaccine: Shingrix discussed. Please contact your pharmacy for coverage information.  Covid-19:done 07/31/19, 08/26/19  Advanced directives: Please bring a copy of your health care power of attorney and living will to the office at your convenience.   Conditions/risks identified: Recommend drinking 6-8 glasses of water per day   Next appointment: Follow up in one year for your annual wellness visit    Preventive Care 65 Years and Older, Female Preventive care refers to lifestyle choices and visits with your health care provider that can promote health and wellness. What does preventive care include? A yearly physical exam. This is also called an annual well check. Dental exams once or twice a year. Routine eye exams. Ask your health care provider how often you should have your eyes checked. Personal lifestyle choices, including: Daily care of your teeth and gums. Regular physical activity. Eating a healthy diet. Avoiding tobacco and drug use. Limiting alcohol use. Practicing safe sex. Taking low-dose aspirin every day. Taking vitamin and mineral supplements as recommended by your health care provider. What happens during an annual well check? The services and screenings done by your health care provider during your annual well check will depend on your age, overall  health, lifestyle risk factors, and family history of disease. Counseling  Your health care provider may ask you questions about your: Alcohol use. Tobacco use. Drug use. Emotional well-being. Home and relationship well-being. Sexual activity. Eating habits. History of falls. Memory and ability to understand (cognition). Work and work Statistician. Reproductive health. Screening  You may have the following tests or measurements: Height, weight, and BMI. Blood pressure. Lipid and cholesterol levels. These may be checked every 5 years, or more frequently if you are over 37 years old. Skin check. Lung cancer screening. You may have this screening every year starting at age 73 if you have a 30-pack-year history of smoking and currently smoke or have quit within the past 15 years. Fecal occult blood test (FOBT) of the stool. You may have this test every year starting at age 36. Flexible sigmoidoscopy or colonoscopy. You may have a sigmoidoscopy every 5 years or a colonoscopy every 10 years starting at age 71. Hepatitis C blood test. Hepatitis B blood test. Sexually transmitted disease (STD) testing. Diabetes screening. This is done by checking your blood sugar (glucose) after you have not eaten for a while (fasting). You may have this done every 1-3 years. Bone density scan. This is done to screen for osteoporosis. You may have this done starting at age 72. Mammogram. This may be done every 1-2 years. Talk to your health care provider about how often you should have regular mammograms. Talk with your health care provider about your test results, treatment options, and if necessary, the need for more tests. Vaccines  Your health care provider may recommend certain vaccines, such as: Influenza vaccine. This is recommended every year. Tetanus, diphtheria, and acellular pertussis (Tdap, Td) vaccine. You may need a Td booster every 10  years. Zoster vaccine. You may need this after age  15. Pneumococcal 13-valent conjugate (PCV13) vaccine. One dose is recommended after age 63. Pneumococcal polysaccharide (PPSV23) vaccine. One dose is recommended after age 49. Talk to your health care provider about which screenings and vaccines you need and how often you need them. This information is not intended to replace advice given to you by your health care provider. Make sure you discuss any questions you have with your health care provider. Document Released: 06/18/2015 Document Revised: 02/09/2016 Document Reviewed: 03/23/2015 Elsevier Interactive Patient Education  2017 [place] Prevention in the Home Falls can cause injuries. They can happen to people of all ages. There are many things you can do to make your home safe and to help prevent falls. What can I do on the outside of my home? Regularly fix the edges of walkways and driveways and fix any cracks. Remove anything that might make you trip as you walk through a door, such as a raised step or threshold. Trim any bushes or trees on the path to your home. Use bright outdoor lighting. Clear any walking paths of anything that might make someone trip, such as rocks or tools. Regularly check to see if handrails are loose or broken. Make sure that both sides of any steps have handrails. Any raised decks and porches should have guardrails on the edges. Have any leaves, snow, or ice cleared regularly. Use sand or salt on walking paths during winter. Clean up any spills in your garage right away. This includes oil or grease spills. What can I do in the bathroom? Use night lights. Install grab bars by the toilet and in the tub and shower. Do not use towel bars as grab bars. Use non-skid mats or decals in the tub or shower. If you need to sit down in the shower, use a plastic, non-slip stool. Keep the floor dry. Clean up any water that spills on the floor as soon as it happens. Remove soap buildup in the tub or shower  regularly. Attach bath mats securely with double-sided non-slip rug tape. Do not have throw rugs and other things on the floor that can make you trip. What can I do in the bedroom? Use night lights. Make sure that you have a light by your bed that is easy to reach. Do not use any sheets or blankets that are too big for your bed. They should not hang down onto the floor. Have a firm chair that has side arms. You can use this for support while you get dressed. Do not have throw rugs and other things on the floor that can make you trip. What can I do in the kitchen? Clean up any spills right away. Avoid walking on wet floors. Keep items that you use a lot in easy-to-reach places. If you need to reach something above you, use a strong step stool that has a grab bar. Keep electrical cords out of the way. Do not use floor polish or wax that makes floors slippery. If you must use wax, use non-skid floor wax. Do not have throw rugs and other things on the floor that can make you trip. What can I do with my stairs? Do not leave any items on the stairs. Make sure that there are handrails on both sides of the stairs and use them. Fix handrails that are broken or loose. Make sure that handrails are as long as the stairways. Check any carpeting to make sure  that it is firmly attached to the stairs. Fix any carpet that is loose or worn. Avoid having throw rugs at the top or bottom of the stairs. If you do have throw rugs, attach them to the floor with carpet tape. Make sure that you have a light switch at the top of the stairs and the bottom of the stairs. If you do not have them, ask someone to add them for you. What else can I do to help prevent falls? Wear shoes that: Do not have high heels. Have rubber bottoms. Are comfortable and fit you well. Are closed at the toe. Do not wear sandals. If you use a stepladder: Make sure that it is fully opened. Do not climb a closed stepladder. Make sure that  both sides of the stepladder are locked into place. Ask someone to hold it for you, if possible. Clearly mark and make sure that you can see: Any grab bars or handrails. First and last steps. Where the edge of each step is. Use tools that help you move around (mobility aids) if they are needed. These include: Canes. Walkers. Scooters. Crutches. Turn on the lights when you go into a dark area. Replace any light bulbs as soon as they burn out. Set up your furniture so you have a clear path. Avoid moving your furniture around. If any of your floors are uneven, fix them. If there are any pets around you, be aware of where they are. Review your medicines with your doctor. Some medicines can make you feel dizzy. This can increase your chance of falling. Ask your doctor what other things that you can do to help prevent falls. This information is not intended to replace advice given to you by your health care provider. Make sure you discuss any questions you have with your health care provider. Document Released: 03/18/2009 Document Revised: 10/28/2015 Document Reviewed: 06/26/2014 Elsevier Interactive Patient Education  2017 [employer].

## 2021-04-10 ENCOUNTER — Other Ambulatory Visit: Payer: Self-pay | Admitting: Family Medicine

## 2021-04-11 NOTE — Telephone Encounter (Signed)
Requested medication (s) are due for refill today:   No  Requested medication (s) are on the active medication list:  Yes  Future visit scheduled:   Yes   Last ordered: 02/14/2021 #90, 0 refills  Returned because mail order pharmacy is requesting a 1 yr supply  Requested Prescriptions  Pending Prescriptions Disp Refills   amLODipine (NORVASC) 10 MG tablet [Pharmacy Med Name: amLODIPine Besylate 10 MG Oral Tablet] 90 tablet 3    Sig: TAKE 1 TABLET BY MOUTH  DAILY     Cardiovascular:  Calcium Channel Blockers Passed - 04/10/2021 10:41 PM      Passed - Last BP in normal range    BP Readings from Last 1 Encounters:  04/05/21 130/78          Passed - Valid encounter within last 6 months    Recent Outpatient Visits           4 weeks ago Annual physical exam   [place], FNP   4 months ago Fatigue, unspecified type   [facility] Kathrine Haddock, NP   10 months ago [place], MD   10 months ago Essential hypertension   [facility] [place] Malkin, MD   1 year ago [place], MD       Future Appointments             In 11 months Reece Packer, [place] [place], [facility], [place]   In 12 months  [facility], [facility]

## 2021-04-26 ENCOUNTER — Other Ambulatory Visit: Payer: Self-pay | Admitting: Family Medicine

## 2021-04-26 DIAGNOSIS — I10 Essential (primary) hypertension: Secondary | ICD-10-CM

## 2021-04-26 DIAGNOSIS — R609 Edema, unspecified: Secondary | ICD-10-CM

## 2021-04-26 NOTE — Telephone Encounter (Signed)
Requested Prescriptions  Pending Prescriptions Disp Refills  . hydrochlorothiazide (HYDRODIURIL) 25 MG tablet [Pharmacy Med Name: hydroCHLOROthiazide 25 MG Oral Tablet] 90 tablet 1    Sig: TAKE 1 TABLET BY MOUTH  DAILY     Cardiovascular: Diuretics - Thiazide Passed - 04/26/2021  4:53 AM      Passed - Ca in normal range and within 360 days    Calcium  Date Value Ref Range Status  03/14/2021 10.2 8.6 - 10.4 mg/dL Final         Passed - Cr in normal range and within 360 days    Creat  Date Value Ref Range Status  03/14/2021 0.77 0.60 - 1.00 mg/dL Final         Passed - K in normal range and within 360 days    Potassium  Date Value Ref Range Status  03/14/2021 3.6 3.5 - 5.3 mmol/L Final         Passed - Na in normal range and within 360 days    Sodium  Date Value Ref Range Status  03/14/2021 138 135 - 146 mmol/L Final         Passed - Last BP in normal range    BP Readings from Last 1 Encounters:  04/05/21 130/78         Passed - Valid encounter within last 6 months    Recent Outpatient Visits          1 month ago Annual physical exam   [place], FNP   4 months ago Fatigue, unspecified type   [facility] Kathrine Haddock, NP   10 months ago [place], MD   11 months ago Essential hypertension   [facility] Towanda Malkin, MD   1 year ago [place], MD      Future Appointments            In 10 months Reece Packer, Myna Hidalgo, [facility], [place]   In 11 months  [facility], [facility]

## 2021-04-26 NOTE — Telephone Encounter (Signed)
Requested Prescriptions  Pending Prescriptions Disp Refills  . hydrochlorothiazide (HYDRODIURIL) 25 MG tablet [Pharmacy Med Name: hydroCHLOROthiazide 25 MG Oral Tablet] 90 tablet 1    Sig: TAKE 1 TABLET BY MOUTH  DAILY     Cardiovascular: Diuretics - Thiazide Passed - 04/26/2021  4:53 AM      Passed - Ca in normal range and within 360 days    Calcium  Date Value Ref Range Status  03/14/2021 10.2 8.6 - 10.4 mg/dL Final         Passed - Cr in normal range and within 360 days    Creat  Date Value Ref Range Status  03/14/2021 0.77 0.60 - 1.00 mg/dL Final         Passed - K in normal range and within 360 days    Potassium  Date Value Ref Range Status  03/14/2021 3.6 3.5 - 5.3 mmol/L Final         Passed - Na in normal range and within 360 days    Sodium  Date Value Ref Range Status  03/14/2021 138 135 - 146 mmol/L Final         Passed - Last BP in normal range    BP Readings from Last 1 Encounters:  04/05/21 130/78         Passed - Valid encounter within last 6 months    Recent Outpatient Visits          1 month ago Annual physical exam   [place], FNP   4 months ago Fatigue, unspecified type   [facility] Kathrine Haddock, NP   10 months ago [place], MD   11 months ago Essential hypertension   [facility] Towanda Malkin, MD   1 year ago [place], MD      Future Appointments            In 10 months Reece Packer, Myna Hidalgo, [facility], [place]   In 11 months  [facility], [facility]

## 2021-06-02 ENCOUNTER — Other Ambulatory Visit: Payer: Self-pay | Admitting: Family Medicine

## 2021-06-02 DIAGNOSIS — K219 Gastro-esophageal reflux disease without esophagitis: Secondary | ICD-10-CM

## 2021-06-03 NOTE — Telephone Encounter (Signed)
Requested Prescriptions  Pending Prescriptions Disp Refills   omeprazole (PRILOSEC) 20 MG capsule [Pharmacy Med Name: Omeprazole 20 MG Oral Capsule Delayed Release] 45 capsule 3    Sig: TAKE 1 CAPSULE BY MOUTH  EVERY OTHER DAY     Gastroenterology: Proton Pump Inhibitors Passed - 06/02/2021  5:30 PM      Passed - Valid encounter within last 12 months    Recent Outpatient Visits          2 months ago Annual physical exam   [place], FNP   6 months ago Fatigue, unspecified type   [facility] Kathrine Haddock, NP   11 months ago [place], Clifford D, MD   1 year ago Essential hypertension   [facility] Towanda Malkin, MD   1 year ago [place], MD      Future Appointments            In 9 months Reece Packer, Myna Hidalgo, [facility], [place]   In 10 months  [facility], [facility]

## 2021-09-29 ENCOUNTER — Other Ambulatory Visit: Payer: Self-pay | Admitting: Family Medicine

## 2021-09-29 DIAGNOSIS — R609 Edema, unspecified: Secondary | ICD-10-CM

## 2021-09-29 DIAGNOSIS — I10 Essential (primary) hypertension: Secondary | ICD-10-CM

## 2021-10-27 ENCOUNTER — Ambulatory Visit: Payer: Self-pay | Admitting: *Deleted

## 2021-10-27 ENCOUNTER — Ambulatory Visit (INDEPENDENT_AMBULATORY_CARE_PROVIDER_SITE_OTHER): Payer: Medicare Other | Admitting: Family Medicine

## 2021-10-27 ENCOUNTER — Encounter: Payer: Self-pay | Admitting: Family Medicine

## 2021-10-27 VITALS — BP 122/72 | HR 90 | Temp 97.9°F | Resp 14 | Ht 62.0 in | Wt 176.5 lb

## 2021-10-27 DIAGNOSIS — M25561 Pain in right knee: Secondary | ICD-10-CM | POA: Diagnosis not present

## 2021-10-27 MED ORDER — MELOXICAM 15 MG PO TABS
7.5000 mg | ORAL_TABLET | Freq: Every day | ORAL | 1 refills | Status: DC
Start: 2021-10-27 — End: 2021-12-26

## 2021-10-27 NOTE — Patient Instructions (Signed)
How to Use a Knee Brace  A knee brace is a device that you wear to support your knee, especially if you have arthritis or your knee is healing after an injury or surgery. There are several types of knee braces. Some are designed to prevent an injury (prophylactic brace). These are often worn during sports. Others support an injured knee (functional brace) or keep it still while it heals (rehabilitative brace). People with severe arthritis of the knee may benefit from a brace that takes some pressure off the knee (unloader brace). Most knee braces are made from cloth combined with metal or plastic. You may need to wear a knee brace: To relieve knee pain. To help your knee support your weight (improve stability). To help you walk farther, or to move more easily (improve mobility). To prevent injury. To support your knee while it heals from an injury or surgery. What are the risks? Generally, knee braces are safe to wear. However, problems may occur, including: Skin irritation. This may cause pain and lead to infection, in rare cases. Making your condition worse if you wear the brace in the wrong way. Stiffness of your knee joint. How to use a knee brace Different braces will have different instructions for use. Your health care provider will tell you or show you: How to put on your brace. How to adjust your brace. When and how often to wear your brace. How to remove your brace. If you can put weight on your leg while wearing your brace. If you need any assistive devices in addition to your brace, such as crutches or a cane. In general, your brace should: Have the hinge of the brace line up with the bend of your knee. Have straps, hooks, or tapes that fasten snugly around your leg. Not feel too tight or too loose. How to care for your knee brace Check your brace often for signs of damage, such as loose connections or attachments. Your knee brace may get damaged or wear out during normal  use. Wash the fabric parts of your brace with soap and water. Allow the brace to air-dry completely before wearing. Read the insert that comes with your brace for other specific care instructions. Follow these instructions at home: If directed, raise (elevate) the injured area above the level of your heart while you are sitting or lying down. Doing this reduces throbbing and swelling, and helps with healing. Pillows can be used for support. Loosen the brace if your toes tingle or if you notice other symptoms or signs that it is too tight, such as: Swelling. Numbness. Color change on your foot or ankle. More pain. Contact a health care provider if: Your knee brace is too loose or too tight and you cannot adjust it. Your knee brace breaks or needs to be replaced. Your knee brace causes pain, skin redness, swelling, bruising, or irritation. Your knee brace is not helping to relieve your problem. Your knee brace is making your knee pain worse. You have any signs of infection of the skin under your knee brace. Summary A knee brace is a device that you wear to support your knee, especially if you have arthritis or your knee is healing after an injury or surgery. Different braces will have different instructions for use. Your health care provider will tell you or show you how to use your knee brace. Check your brace often for signs of damage, such as loose connections or attachments. Your knee brace may get damaged  or wear out during normal use. Contact a health care provider if your knee brace is not helping to relieve your problem or is making your knee pain worse. This information is not intended to replace advice given to you by your health care provider. Make sure you discuss any questions you have with your health care provider. Document Revised: 02/25/2021 Document Reviewed: 02/25/2021 Elsevier Patient Education  [place].

## 2021-10-27 NOTE — Telephone Encounter (Signed)
Reason for Disposition  [1] MODERATE leg swelling (e.g., swelling extends up to knees) AND [2] new-onset or worsening  Answer Assessment - Initial Assessment Questions 1. ONSET: "When did the swelling start?" (e.g., minutes, hours, days)     3-4 days 2. LOCATION: "What part of the leg is swollen?"  "Are both legs swollen or just one leg?"     Right knee 3. SEVERITY: "How bad is the swelling?" (e.g., localized; mild, moderate, severe)  - Localized - small area of swelling localized to one leg  - MILD pedal edema - swelling limited to foot and ankle, pitting edema < 1/4 inch (6 mm) deep, rest and elevation eliminate most or all swelling  - MODERATE edema - swelling of lower leg to knee, pitting edema > 1/4 inch (6 mm) deep, rest and elevation only partially reduce swelling  - SEVERE edema - swelling extends above knee, facial or hand swelling present      "Not twice the size just bigger than other" 4. REDNESS: "Does the swelling look red or infected?"     Mild redness at times 5. PAIN: "Is the swelling painful to touch?" If Yes, ask: "How painful is it?"   (Scale 1-10; mild, moderate or severe)     6/10 6. FEVER: "Do you have a fever?" If Yes, ask: "What is it, how was it measured, and when did it start?"      no 7. CAUSE: "What do you think is causing the leg swelling?"     Surgery in past. 8. MEDICAL HISTORY: "Do you have a history of heart failure, kidney disease, liver failure, or cancer?"     *No Answer* 9. RECURRENT SYMPTOM: "Have you had leg swelling before?" If Yes, ask: "When was the last time?" "What happened that time?"      10. OTHER SYMPTOMS: "Do you have any other symptoms?" (e.g., chest pain, difficulty breathing)       no  Protocols used: Leg Swelling and Edema-A-AH

## 2021-10-27 NOTE — Telephone Encounter (Signed)
  Chief Complaint: Knee swelling Symptoms: Right knee pain 6/10 and swelling, mild redness Frequency: 3-4 days ago Pertinent Negatives: Patient denies SOB, CP,warmth Disposition: '[]'$ ED /'[]'$ Urgent Care (no appt availability in office) / '[x]'$ Appointment(In office/virtual)/ '[]'$  [place] [facility]/ '[]'$ Home Care/ '[]'$ Refused Recommended Disposition /'[]'$  Mobile Bus/ '[]'$  Follow-up with PCP Additional Notes: Appt secured for today prior to triage. Care advise provided, pt verbalizes understanding.

## 2021-10-27 NOTE — Progress Notes (Signed)
Patient ID: Patricia Richardson, female    DOB: 1951-01-07, 71 y.o.   MRN: [id number]  PCP: [facility]  Chief Complaint  Patient presents with   Knee Pain    Right    Subjective:   Patricia Richardson is a 71 y.o. female, presents to clinic with CC of the following:  HPI  Right knee pain for 4-5 days with some swelling, discomfort, worse with walking feels like its going to give out Called her ortho but could not get in right away She has tried elevation and resting Pain located to anterior medial knee, feels like something is catching    Patient Active Problem List   Diagnosis Date Noted   Parkinsonism ([place]) 09/12/2020   Polycythemia 06/10/2020   History of anxiety 06/08/2020   Tremor 04/08/2020   Mixed hyperlipidemia 08/28/2019   Leg pain 07/15/2019   DJD (degenerative joint disease) 07/15/2019   Gastroesophageal reflux disease without esophagitis 04/01/2019   Multiple thyroid nodules 04/01/2019   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   Prediabetes 06/20/2018   At risk for heart disease 06/20/2018   Essential hypertension 06/17/2018   Hx of completed stroke 06/17/2018   Obesity (BMI 35.0-39.9 without comorbidity) 06/17/2018   Status post total hip replacement, 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   Primary osteoarthritis of right hip 03/12/2017      Current Outpatient Medications:    acetaminophen (TYLENOL) 500 MG tablet, Take 1-2 tablets (500-1,000 mg total) by mouth every 6 (six) hours as needed for mild pain (pain score 1-3 or temp > 100.5)., Disp: , Rfl:    amLODipine (NORVASC) 10 MG tablet, TAKE 1 TABLET BY MOUTH  DAILY, Disp: 90 tablet, Rfl: 3   aspirin EC 81 MG tablet, Take 1 tablet (81 mg total) by mouth daily., Disp: 90 tablet, Rfl: 3   atorvastatin (LIPITOR) 10 MG tablet, TAKE 1 TABLET BY MOUTH AT BEDTIME 2 NIGHTS PER WEEK, Disp: 26 tablet, Rfl: 3   carbidopa-levodopa (SINEMET IR) 25-100 MG tablet, Take 1 tablet by mouth 3 (three) times daily., Disp: , Rfl:    gabapentin  (NEURONTIN) 100 MG capsule, Take 100 mg by mouth 4 (four) times daily., Disp: , Rfl:    hydrochlorothiazide (HYDRODIURIL) 25 MG tablet, TAKE 1 TABLET BY MOUTH DAILY, Disp: 90 tablet, Rfl: 3   meloxicam (MOBIC) 15 MG tablet, Take 0.5-1 tablets (7.5-15 mg total) by mouth daily., Disp: 30 tablet, Rfl: 1   omeprazole (PRILOSEC) 20 MG capsule, TAKE 1 CAPSULE BY MOUTH  EVERY OTHER DAY, Disp: 45 capsule, Rfl: 3   OVER THE COUNTER MEDICATION, Take 1 tablet by mouth daily as needed (constipation). Swiss Kriss herbal laxative, Disp: , Rfl:    Allergies  Allergen Reactions   Codeine Itching    Pin and needles      Social History   Tobacco Use   Smoking status: Never   Smokeless tobacco: Never  Vaping Use   Vaping Use: Never used  Substance Use Topics   Alcohol use: Yes    Comment: rarely   Drug use: No      Chart Review Today: I personally reviewed active problem list, medication list, allergies, family history, social history, health maintenance, notes from last encounter, lab results, imaging with the patient/caregiver today.   Review of Systems  Constitutional: Negative.   HENT: Negative.    Eyes: Negative.   Respiratory: Negative.    Cardiovascular: Negative.   Gastrointestinal: Negative.   Endocrine: Negative.   Genitourinary: Negative.   Musculoskeletal:  Negative.   Skin: Negative.   Allergic/Immunologic: Negative.   Neurological: Negative.   Hematological: Negative.   Psychiatric/Behavioral: Negative.    All other systems reviewed and are negative.     Objective:   Vitals:   10/27/21 1511  BP: 122/72  Pulse: 90  Resp: 14  Temp: 97.9 F (36.6 C)  TempSrc: Oral  SpO2: 98%  Weight: 176 lb 8 oz (80.1 kg)  Height: 5' 2" (1.575 m)    Body mass index is 32.28 kg/m.  Physical Exam Vitals and nursing note reviewed.  Constitutional:      Appearance: She is well-developed.  HENT:     Head: Normocephalic and atraumatic.     Nose: Nose normal.  Eyes:      General:        Right eye: No discharge.        Left eye: No discharge.     Conjunctiva/sclera: Conjunctivae normal.  Neck:     Trachea: No tracheal deviation.  Cardiovascular:     Rate and Rhythm: Normal rate and regular rhythm.  Pulmonary:     Effort: Pulmonary effort is normal. No respiratory distress.     Breath sounds: No stridor.  Musculoskeletal:     Right knee: Swelling present. No ecchymosis. Normal range of motion. Tenderness present over the medial joint line. Normal patellar mobility. Normal pulse.     Comments: Right knee/lower leg, no pitting edema, no erythema No popliteal fossa ttp Right/Left calf circumference 43 cm/42 cm  Skin:    General: Skin is warm and dry.     Findings: No rash.  Neurological:     Mental Status: She is alert.     Motor: Tremor present. No abnormal muscle tone.     Gait: Gait abnormal (antalgic gait).  Psychiatric:        Behavior: Behavior normal.     Results for orders placed or performed in visit on 03/14/21  COMPLETE METABOLIC PANEL WITH GFR  Result Value Ref Range   Glucose, Bld 141 (H) 65 - 99 mg/dL   BUN 14 7 - 25 mg/dL   Creat 0.77 0.60 - 1.00 mg/dL   eGFR 82 > OR = 60 mL/min/1.1m   BUN/Creatinine Ratio NOT APPLICABLE 6 - 22 (calc)   Sodium 138 135 - 146 mmol/L   Potassium 3.6 3.5 - 5.3 mmol/L   Chloride 102 98 - 110 mmol/L   CO2 24 20 - 32 mmol/L   Calcium 10.2 8.6 - 10.4 mg/dL   Total Protein 7.4 6.1 - 8.1 g/dL   Albumin 4.6 3.6 - 5.1 g/dL   Globulin 2.8 1.9 - 3.7 g/dL (calc)   AG Ratio 1.6 1.0 - 2.5 (calc)   Total Bilirubin 0.6 0.2 - 1.2 mg/dL   Alkaline phosphatase (APISO) 74 37 - 153 U/L   AST 15 10 - 35 U/L   ALT 17 6 - 29 U/L  Lipid panel  Result Value Ref Range   Cholesterol 155 <200 mg/dL   HDL 59 > OR = 50 mg/dL   Triglycerides 130 <150 mg/dL   LDL Cholesterol (Calc) 75 mg/dL (calc)   Total CHOL/HDL Ratio 2.6 <5.0 (calc)   Non-HDL Cholesterol (Calc) 96 <130 mg/dL (calc)  Hemoglobin A1c  Result Value  Ref Range   Hgb A1c MFr Bld 5.7 (H) <5.7 % of total Hgb   Mean Plasma Glucose 117 mg/dL   eAG (mmol/L) 6.5 mmol/L  TSH  Result Value Ref Range   TSH 0.20 (L) 0.40 -  4.50 mIU/L  CBC with Differential/Platelet  Result Value Ref Range   WBC 9.0 3.8 - 10.8 Thousand/uL   RBC 4.92 3.80 - 5.10 Million/uL   Hemoglobin 14.9 11.7 - 15.5 g/dL   HCT 43.4 35.0 - 45.0 %   MCV 88.2 80.0 - 100.0 fL   MCH 30.3 27.0 - 33.0 pg   MCHC 34.3 32.0 - 36.0 g/dL   RDW 13.0 11.0 - 15.0 %   Platelets 393 140 - 400 Thousand/uL   MPV 10.2 7.5 - 12.5 fL   Neutro Abs 6,804 1,500 - 7,800 cells/uL   Lymphs Abs 1,629 850 - 3,900 cells/uL   Absolute Monocytes 504 200 - 950 cells/uL   Eosinophils Absolute 27 15 - 500 cells/uL   Basophils Absolute 36 0 - 200 cells/uL   Neutrophils Relative % 75.6 %   Total Lymphocyte 18.1 %   Monocytes Relative 5.6 %   Eosinophils Relative 0.3 %   Basophils Relative 0.4 %       Assessment & Plan:   1. Acute pain of right knee Exam limited today- examined in normal chair and not exam table Some edema vs effusion right knee, medial joint line ttp, no crepitus felt Recommend brace for support/compression, elevate, ice, rest Trial of mobic and should f/up with ortho - prior scope  - defer imaging to ortho - meloxicam (MOBIC) 15 MG tablet; Take 0.5-1 tablets (7.5-15 mg total) by mouth daily.  Dispense: 30 tablet; Refill: 1 - US Venous Img Lower Unilateral Right (DVT); Future With some leg swelling and slightly larger right calf circumference when compared to left, [place] to help f/o DVT Recent trip to the beach with 4 hours of driving, no other injury/trauma       Delsa Grana, PA-C 10/27/21 3:28 PM

## 2021-11-29 ENCOUNTER — Other Ambulatory Visit: Payer: Self-pay | Admitting: Unknown Physician Specialty

## 2021-11-29 DIAGNOSIS — Z8673 Personal history of transient ischemic attack (TIA), and cerebral infarction without residual deficits: Secondary | ICD-10-CM

## 2021-12-21 ENCOUNTER — Ambulatory Visit: Payer: Self-pay | Admitting: Family Medicine

## 2021-12-21 NOTE — Telephone Encounter (Signed)
  Chief Complaint: abd pain especially when bends over, food feels like it gets stuck in her throat, increased abd pain after eating Symptoms: See above Frequency: Going on for a couple of months Pertinent Negatives: Patient denies diarrhea/vomiting Disposition: '[]'$ ED /'[]'$ Urgent Care (no appt availability in office) / '[x]'$ Appointment(In office/virtual)/ '[]'$  [place] [facility]/ '[]'$ Home Care/ '[]'$ Refused Recommended Disposition /'[]'$  Mobile Bus/ '[]'$  Follow-up with PCP Additional Notes: Offered her a MyChart virtual visit or urgent care since there are no appts within 24 hrs.   I did make her an appt with Serafina Royals for 821/2023 at 8:40 and put her on the wait list.  Instructed to go to ED or urgent care if she became worse.  She is going to think about doing the virtual care but wanted to think about it first.   Didn't want to schedule a virtual yet.   I let her know to call us back and we would be glad to assist her in scheduling the virtual visit

## 2021-12-21 NOTE — Telephone Encounter (Signed)
Reason for Disposition  Age > 60 years  Answer Assessment - Initial Assessment Questions 1. LOCATION: "Where does it hurt?"      Having pain in waist line and down.   If I lean over it hurts really bad.   When I eat it bothers me.  When I swallow it bothers me.   It feels like it stops in throat. A couple of months this has been going on.  I thought it would go away.   I have to stop and get a breath sometimes.   I'm tired too.  2. RADIATION: "Does the pain shoot anywhere else?" (e.g., chest, back)     No  3. ONSET: "When did the pain begin?" (e.g., minutes, hours or days ago)      A couple of months ago  4. SUDDEN: "Gradual or sudden onset?"     Not aske 5. PATTERN "Does the pain come and go, or is it constant?"    - If it comes and goes: "How long does it last?" "Do you have pain now?"     (Note: Comes and goes means the pain is intermittent. It goes away completely between bouts.)    - If constant: "Is it getting better, staying the same, or getting worse?"      (Note: Constant means the pain never goes away completely; most serious pain is constant and gets worse.)      When I bend over and after I eat.   6. SEVERITY: "How bad is the pain?"  (e.g., Scale 1-10; mild, moderate, or severe)    - MILD (1-3): Doesn't interfere with normal activities, abdomen soft and not tender to touch.     - MODERATE (4-7): Interferes with normal activities or awakens from sleep, abdomen tender to touch.     - SEVERE (8-10): Excruciating pain, doubled over, unable to do any normal activities.       Bad when I bend over 7. RECURRENT SYMPTOM: "Have you ever had this type of stomach pain before?" If Yes, ask: "When was the last time?" and "What happened that time?"      No 8. CAUSE: "What do you think is causing the stomach pain?"     I don't know 9. RELIEVING/AGGRAVATING FACTORS: "What makes it better or worse?" (e.g., antacids, bending or twisting motion, bowel movement)     Bending over makes it hurt  really bad.  10. OTHER SYMPTOMS: "Do you have any other symptoms?" (e.g., back pain, diarrhea, fever, urination pain, vomiting)       No diarrhea or vomiting.   11. PREGNANCY: "Is there any chance you are pregnant?" "When was your last menstrual period?"       N/A  Protocols used: Abdominal Pain - [facility]

## 2021-12-22 ENCOUNTER — Other Ambulatory Visit: Payer: Self-pay | Admitting: Family Medicine

## 2021-12-22 DIAGNOSIS — M25561 Pain in right knee: Secondary | ICD-10-CM

## 2021-12-26 ENCOUNTER — Ambulatory Visit (INDEPENDENT_AMBULATORY_CARE_PROVIDER_SITE_OTHER): Payer: Medicare Other | Admitting: Family Medicine

## 2021-12-26 ENCOUNTER — Encounter: Payer: Self-pay | Admitting: Family Medicine

## 2021-12-26 VITALS — BP 112/74 | HR 98 | Temp 97.9°F | Resp 16 | Ht 62.0 in | Wt 177.5 lb

## 2021-12-26 DIAGNOSIS — R5383 Other fatigue: Secondary | ICD-10-CM

## 2021-12-26 DIAGNOSIS — R7989 Other specified abnormal findings of blood chemistry: Secondary | ICD-10-CM | POA: Diagnosis not present

## 2021-12-26 DIAGNOSIS — D751 Secondary polycythemia: Secondary | ICD-10-CM

## 2021-12-26 DIAGNOSIS — I10 Essential (primary) hypertension: Secondary | ICD-10-CM

## 2021-12-26 DIAGNOSIS — R1013 Epigastric pain: Secondary | ICD-10-CM

## 2021-12-26 DIAGNOSIS — Z1211 Encounter for screening for malignant neoplasm of colon: Secondary | ICD-10-CM

## 2021-12-26 DIAGNOSIS — R1312 Dysphagia, oropharyngeal phase: Secondary | ICD-10-CM

## 2021-12-26 DIAGNOSIS — D692 Other nonthrombocytopenic purpura: Secondary | ICD-10-CM | POA: Insufficient documentation

## 2021-12-26 NOTE — Patient Instructions (Signed)
Stop meloxicam Take omeprazole daily  Add pepcid AC before bed time Try to wear a dress to see if epigastric pain improves Referral placed for gastroenterologist  Checking labs today

## 2021-12-26 NOTE — Progress Notes (Signed)
Name: Patricia Richardson   MRN: [id number]    DOB: 1950-08-03   Date:12/26/2021       Progress Note  Subjective  Chief Complaint  Abdominal Pain  HPI  Other Fatigue: she states that over the past couple of months she noticed fatigue, no prior illness to trigger the fatigue. She feels sleepy during the day. She still sleeping during the night.She also has SOB with activity but sometimes even while at rest. No appetite , mild weight loss since last year but less than 5 lbs. She denies chest pain, denies previous tobacco use.   Parkinsonism: she has tremors and now having difficulty swallowing, causes her to choke . She sees neurologist, explained likely the cause of symptoms   Mid abdominal pain: she sates noticed some pain when bending forward to stand up she has a soreness on her waist , no change in bowel movement. Pain resolves by itself, it lasts a couple of minutes. Symptoms started over the past couple weeks, she has been on Meloxicam May 2023 . Discussed trying stopping Meloxicam and try Tylenol and resume Omeprazole daily instead of every other day to see if pain resolves.   Low back pain: since she had hip replacement , she states her legs are not the same length, she will follow up with Dr. 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   Prediabetes 06/20/2018   At risk for heart disease 06/20/2018   Essential hypertension 06/17/2018   Hx of completed stroke 06/17/2018   Obesity (BMI 35.0-39.9 without comorbidity)  06/17/2018   Status post total hip replacement, 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   Primary osteoarthritis of right hip 03/12/2017    Past Surgical History:  Procedure Laterality Date   ABDOMINAL HYSTERECTOMY     BREAST BIOPSY Right 11/03/2005   core biopsy w/ clip placement   CATARACT EXTRACTION W/PHACO Right 09/15/2020   Procedure: CATARACT EXTRACTION PHACO AND INTRAOCULAR LENS PLACEMENT ([place]) RIGHT;  Surgeon: Leandrew Koyanagi, MD;  Location: [place];  Service: Ophthalmology;  Laterality: Right;  4.70 0:57.4 8.2%   CATARACT EXTRACTION W/PHACO Left 09/29/2020   Procedure: CATARACT EXTRACTION PHACO AND INTRAOCULAR LENS PLACEMENT ([facility]) LEFT 3.01 00:45.7 6.6%;  Surgeon: Leandrew Koyanagi, MD;  Location: [place];  Service: Ophthalmology;  Laterality: Left;   CHOLECYSTECTOMY     COLONOSCOPY     COLONOSCOPY WITH PROPOFOL N/A 11/05/2015   Procedure: COLONOSCOPY WITH PROPOFOL;  Surgeon: Lollie Sails, MD;  [facility];  Service: Endoscopy;  Laterality: N/A;   JOINT REPLACEMENT  04-09-2018   KNEE ARTHROSCOPY Right 02/21/2008   TOTAL HIP ARTHROPLASTY Right 04/09/2018   Procedure: TOTAL HIP ARTHROPLASTY ANTERIOR APPROACH;  Surgeon: Hessie Knows, MD;  Location: [facility];  Service: Orthopedics;  Laterality: Right;   WISDOM TOOTH EXTRACTION      Family History  Problem Relation Age of Onset   Breast cancer Maternal Aunt        great   Kidney disease Mother    Diabetes Mother    Hypertension Father  Kidney disease Father    Diabetes Brother    Diabetes Paternal Grandmother     Social History   Tobacco Use   Smoking status: Never   Smokeless tobacco: Never  Substance Use Topics   Alcohol use: Yes    Comment: rarely     Current Outpatient Medications:    acetaminophen (TYLENOL) 500 MG tablet, Take 1-2 tablets (500-1,000 mg total) by mouth every 6 (six) hours as needed for mild pain (pain score 1-3 or temp > 100.5)., Disp: , Rfl:    amLODipine  (NORVASC) 10 MG tablet, TAKE 1 TABLET BY MOUTH  DAILY, Disp: 90 tablet, Rfl: 3   aspirin EC 81 MG tablet, Take 1 tablet (81 mg total) by mouth daily., Disp: 90 tablet, Rfl: 3   atorvastatin (LIPITOR) 10 MG tablet, TAKE 1 TABLET BY MOUTH AT  BEDTIME 2 NIGHTS PER WEEK, Disp: 26 tablet, Rfl: 2   carbidopa-levodopa (SINEMET IR) 25-100 MG tablet, Take 1 tablet by mouth 3 (three) times daily., Disp: , Rfl:    gabapentin (NEURONTIN) 100 MG capsule, Take 100 mg by mouth 4 (four) times daily., Disp: , Rfl:    hydrochlorothiazide (HYDRODIURIL) 25 MG tablet, TAKE 1 TABLET BY MOUTH DAILY, Disp: 90 tablet, Rfl: 3   meloxicam (MOBIC) 15 MG tablet, Take 0.5-1 tablets (7.5-15 mg total) by mouth daily., Disp: 30 tablet, Rfl: 1   omeprazole (PRILOSEC) 20 MG capsule, TAKE 1 CAPSULE BY MOUTH  EVERY OTHER DAY, Disp: 45 capsule, Rfl: 3   OVER THE COUNTER MEDICATION, Take 1 tablet by mouth daily as needed (constipation). Swiss Kriss herbal laxative, Disp: , Rfl:   Allergies  Allergen Reactions   Codeine Itching    Pin and needles     I personally reviewed active problem list, medication list, allergies, family history, social history, health maintenance with the patient/caregiver today.   ROS  Constitutional: Negative for fever or weight change.  Respiratory: Negative for cough , positive for  shortness of breath.   Cardiovascular: Negative for chest pain or palpitations.  Gastrointestinal: positive for abdominal pain, but no  bowel changes.  Musculoskeletal: positive for gait problem but no  joint swelling.  Skin: Negative for rash.  Neurological: Negative for dizziness or headache.  No other specific complaints in a complete review of systems (except as listed in HPI above).   Objective  Vitals:   12/26/21 1356  BP: 112/74  Pulse: 98  Resp: 16  Temp: 97.9 F (36.6 C)  TempSrc: Oral  SpO2: 100%  Weight: 177 lb 8 oz (80.5 kg)  Height: '5\' 2"'$  (1.575 m)    Body mass index is 32.47  kg/m.  Physical Exam  Constitutional: Patient appears well-developed and well-nourished. Obese  No distress.  HEENT: head atraumatic, normocephalic, pupils equal and reactive to light,, neck supple, throat within normal limits Cardiovascular: Normal rate, regular rhythm and normal heart sounds.  No murmur heard. No BLE edema. Pulmonary/Chest: Effort normal and breath sounds normal. No respiratory distress. Abdominal: Soft.  There is no tenderness. Neuro: tremors at rest  Psychiatric: Patient has a normal mood and affect. behavior is normal. Judgment and thought content normal.   PHQ2/9:    12/26/2021    1:58 PM 10/27/2021    3:11 PM 04/05/2021    9:03 AM 03/14/2021    1:37 PM 11/30/2020    8:25 AM  Depression screen PHQ 2/9  Decreased Interest 0 0 0 0 0  Down, Depressed, Hopeless 0 0 0 0 0  PHQ -  2 Score 0 0 0 0 0  Altered sleeping 0 0  0 0  Tired, decreased energy 3 0  0 0  Change in appetite 0 0  0 0  Feeling bad or failure about yourself  0 0  0 0  Trouble concentrating 0 0  0 0  Moving slowly or fidgety/restless 0 0  0 0  Suicidal thoughts 0 0  0 0  PHQ-9 Score 3 0  0 0  Difficult doing work/chores   Not difficult at all Not difficult at all Not difficult at all    phq 9 is negative   Fall Risk:    12/26/2021    1:58 PM 10/27/2021    3:10 PM 04/05/2021    9:05 AM 03/14/2021    1:36 PM 11/30/2020    8:25 AM  Fall Risk   Falls in the past year? 0 0 0 0 0  Number falls in past yr:   0 0 0  Injury with Fall?   0 0 0  Risk for fall due to : No Fall Risks No Fall Risks No Fall Risks    Follow up Falls prevention discussed Falls prevention discussed Falls prevention discussed Falls evaluation completed       Functional Status Survey: Is the patient deaf or have difficulty hearing?: No Does the patient have difficulty seeing, even when wearing glasses/contacts?: No Does the patient have difficulty concentrating, remembering, or making decisions?: No Does the patient  have difficulty walking or climbing stairs?: Yes Does the patient have difficulty dressing or bathing?: No Does the patient have difficulty doing errands alone such as visiting a doctor's office or shopping?: No    Assessment & Plan  1. Other fatigue  Discussed depression as a common reason for fatigue - she said the tremors due bother her - B12 and Folate Panel - COMPLETE METABOLIC PANEL WITH GFR - CBC with Differential/Platelet  2. Low TSH level  - Thyroid Panel With TSH  3. Essential hypertension  - COMPLETE METABOLIC PANEL WITH GFR - CBC with Differential/Platelet  4. Polycythemia  - B12 and Folate Panel  5. Oropharyngeal dysphagia  - Ambulatory referral to Gastroenterology It may be due to parkinsonian features  6. Colon cancer screening  - Ambulatory referral to Gastroenterology  7. Epigastric abdominal pain  - Ambulatory referral to Gastroenterology   8. Senile purpura ([place])  Reassurance given

## 2021-12-27 ENCOUNTER — Encounter: Payer: Self-pay | Admitting: Family Medicine

## 2021-12-27 DIAGNOSIS — E538 Deficiency of other specified B group vitamins: Secondary | ICD-10-CM | POA: Insufficient documentation

## 2021-12-27 LAB — COMPLETE METABOLIC PANEL WITH GFR
AG Ratio: 1.6 (calc) (ref 1.0–2.5)
ALT: 20 U/L (ref 6–29)
AST: 21 U/L (ref 10–35)
Albumin: 4.8 g/dL (ref 3.6–5.1)
Alkaline phosphatase (APISO): 75 U/L (ref 37–153)
BUN: 17 mg/dL (ref 7–25)
CO2: 21 mmol/L (ref 20–32)
Calcium: 10.4 mg/dL (ref 8.6–10.4)
Chloride: 101 mmol/L (ref 98–110)
Creat: 0.8 mg/dL (ref 0.60–1.00)
Globulin: 3 g/dL (calc) (ref 1.9–3.7)
Glucose, Bld: 95 mg/dL (ref 65–99)
Potassium: 3.8 mmol/L (ref 3.5–5.3)
Sodium: 140 mmol/L (ref 135–146)
Total Bilirubin: 0.5 mg/dL (ref 0.2–1.2)
Total Protein: 7.8 g/dL (ref 6.1–8.1)
eGFR: 78 mL/min/{1.73_m2} (ref >=60)

## 2021-12-27 LAB — CBC WITH DIFFERENTIAL/PLATELET
Absolute Monocytes: 409 cells/uL (ref 200–950)
Basophils Absolute: 61 cells/uL (ref 0–200)
Basophils Relative: 0.7 %
Eosinophils Absolute: 78 cells/uL (ref 15–500)
Eosinophils Relative: 0.9 %
HCT: 48.7 % — ABNORMAL HIGH (ref 35.0–45.0)
Hemoglobin: 16.2 g/dL — ABNORMAL HIGH (ref 11.7–15.5)
Lymphs Abs: 1523 cells/uL (ref 850–3900)
MCH: 30 pg (ref 27.0–33.0)
MCHC: 33.3 g/dL (ref 32.0–36.0)
MCV: 90.2 fL (ref 80.0–100.0)
MPV: 10.7 fL (ref 7.5–12.5)
Monocytes Relative: 4.7 %
Neutro Abs: 6629 cells/uL (ref 1500–7800)
Neutrophils Relative %: 76.2 %
Platelets: 391 10*3/uL (ref 140–400)
RBC: 5.4 10*6/uL — ABNORMAL HIGH (ref 3.80–5.10)
RDW: 12.8 % (ref 11.0–15.0)
Total Lymphocyte: 17.5 %
WBC: 8.7 10*3/uL (ref 3.8–10.8)

## 2021-12-27 LAB — THYROID PANEL WITH TSH
Free Thyroxine Index: 2.1 (ref 1.4–3.8)
T3 Uptake: 28 % (ref 22–35)
T4, Total: 7.4 ug/dL (ref 5.1–11.9)
TSH: 0.22 mIU/L — ABNORMAL LOW (ref 0.40–4.50)

## 2021-12-27 LAB — B12 AND FOLATE PANEL
Folate: >24 ng/mL
Vitamin B-12: 301 pg/mL (ref 200–1100)

## 2021-12-28 ENCOUNTER — Ambulatory Visit (INDEPENDENT_AMBULATORY_CARE_PROVIDER_SITE_OTHER): Payer: Medicare Other

## 2021-12-28 DIAGNOSIS — E538 Deficiency of other specified B group vitamins: Secondary | ICD-10-CM

## 2021-12-28 MED ORDER — CYANOCOBALAMIN 1000 MCG/ML IJ SOLN
1000.0000 ug | Freq: Once | INTRAMUSCULAR | Status: AC
  Administered 2021-12-28: 1000 ug via INTRAMUSCULAR

## 2022-01-05 ENCOUNTER — Encounter: Payer: Self-pay | Admitting: Family Medicine

## 2022-01-06 ENCOUNTER — Other Ambulatory Visit: Payer: Self-pay | Admitting: Family Medicine

## 2022-01-06 DIAGNOSIS — Z8673 Personal history of transient ischemic attack (TIA), and cerebral infarction without residual deficits: Secondary | ICD-10-CM

## 2022-01-06 DIAGNOSIS — D751 Secondary polycythemia: Secondary | ICD-10-CM

## 2022-01-06 DIAGNOSIS — R5383 Other fatigue: Secondary | ICD-10-CM

## 2022-01-06 NOTE — Telephone Encounter (Unsigned)
Copied from [place] [id number]. Topic: General - Other >> Jan 06, 2022  9:13 AM Tiffany B wrote: Reason for CRM: Patient following up regarding 7/24 OV with Dr. Ancil Boozer and states Dr. Ancil Boozer suggested a sleep study. Patient following up regarding sleep study orders. Patient would like this study prior to her 02/13/2022 appointment

## 2022-01-12 ENCOUNTER — Other Ambulatory Visit: Payer: Self-pay | Admitting: Family Medicine

## 2022-01-13 ENCOUNTER — Other Ambulatory Visit: Payer: Self-pay | Admitting: Nurse Practitioner

## 2022-01-13 MED ORDER — AMLODIPINE BESYLATE 10 MG PO TABS: 10.0000 mg | ORAL_TABLET | Freq: Every day | ORAL | 1 refills | Status: DC

## 2022-01-13 NOTE — Telephone Encounter (Signed)
Requested Prescriptions  Pending Prescriptions Disp Refills  . amLODipine (NORVASC) 10 MG tablet [Pharmacy Med Name: amLODIPine Besylate 10 MG Oral Tablet] 100 tablet 0    Sig: TAKE 1 TABLET BY MOUTH DAILY     Cardiovascular: Calcium Channel Blockers 2 Passed - 01/12/2022 10:10 PM      Passed - Last BP in normal range    BP Readings from Last 1 Encounters:  12/26/21 112/74         Passed - Last Heart Rate in normal range    Pulse Readings from Last 1 Encounters:  12/26/21 98         Passed - Valid encounter within last 6 months    Recent Outpatient Visits          2 weeks ago Other fatigue   [facility] Steele Sizer, MD   2 months ago Acute pain of right knee   [facility] Delsa Grana, PA-C   10 months ago Annual physical exam   [facility] Merino, FNP   1 year ago Fatigue, unspecified type   [facility] Kathrine Haddock, NP   1 year ago [facility] Towanda Malkin, MD      Future Appointments            In 1 month Reece Packer, Myna Hidalgo, [facility], [place]   In 2 months Reece Packer, Myna Hidalgo, [facility], [place]   In 2 months  [facility], [facility]

## 2022-01-16 NOTE — Telephone Encounter (Unsigned)
Copied from [place] [id number] [id number]. Topic: General - Inquiry >> Jan 16, 2022  1:07 PM Marcellus Scott wrote: Reason for CRM: Lattie Haw from [facility] Sleep lab stated study type is missing what type of sleep study are they going to perform on pt .   Please advise. >> Jan 16, 2022  1:09 PM Marcellus Scott wrote: Study type is missing from referral.

## 2022-01-20 ENCOUNTER — Telehealth: Payer: Self-pay | Admitting: Nurse Practitioner

## 2022-01-20 NOTE — Telephone Encounter (Signed)
Called left message for Sleep lab to call office with questions

## 2022-01-20 NOTE — Telephone Encounter (Signed)
Copied from [place] [id number]. Topic: Referral - Status >> Jan 20, 2022 11:16 AM Chapman Fitch wrote: Reason for CRM: Sleep study center advise pt that they need more info before they can schedule appt and they need to speak with provider asap / they advised pt that they called 3 times with no answer to the phones

## 2022-01-20 NOTE — Telephone Encounter (Signed)
Spoke to patient and she informed us to call Sleep lab at [facility] for directions

## 2022-01-23 ENCOUNTER — Ambulatory Visit: Payer: Medicare Other | Admitting: Nurse Practitioner

## 2022-01-23 NOTE — Telephone Encounter (Signed)
Spoke to sleep lab and they will fax over paper work to be sign of what test is needed

## 2022-01-31 ENCOUNTER — Ambulatory Visit (INDEPENDENT_AMBULATORY_CARE_PROVIDER_SITE_OTHER): Payer: Medicare Other

## 2022-01-31 DIAGNOSIS — E538 Deficiency of other specified B group vitamins: Secondary | ICD-10-CM

## 2022-01-31 MED ORDER — CYANOCOBALAMIN 1000 MCG/ML IJ SOLN
1000.0000 ug | Freq: Once | INTRAMUSCULAR | Status: AC
  Administered 2022-01-31: 1000 ug via INTRAMUSCULAR

## 2022-02-13 ENCOUNTER — Other Ambulatory Visit: Payer: Self-pay

## 2022-02-13 ENCOUNTER — Encounter: Payer: Self-pay | Admitting: Nurse Practitioner

## 2022-02-13 ENCOUNTER — Ambulatory Visit (INDEPENDENT_AMBULATORY_CARE_PROVIDER_SITE_OTHER): Payer: Medicare Other | Admitting: Nurse Practitioner

## 2022-02-13 VITALS — BP 128/74 | HR 97 | Temp 97.9°F | Resp 16 | Ht 62.0 in | Wt 177.9 lb

## 2022-02-13 DIAGNOSIS — R5383 Other fatigue: Secondary | ICD-10-CM

## 2022-02-13 DIAGNOSIS — Z23 Encounter for immunization: Secondary | ICD-10-CM | POA: Diagnosis not present

## 2022-02-13 DIAGNOSIS — R1013 Epigastric pain: Secondary | ICD-10-CM

## 2022-02-13 NOTE — Progress Notes (Signed)
BP 128/74   Pulse 97   Temp 97.9 F (36.6 C) (Oral)   Resp 16   Ht 5' 2"  (1.575 m)   Wt 177 lb 14.4 oz (80.7 kg)   SpO2 99%   BMI 32.54 kg/m    Subjective:    Patient ID: Patricia Richardson, female    DOB: [birth year], 71 y.o.   MRN: [id number]  HPI: Patricia Richardson is a 71 y.o. female  Chief Complaint  Patient presents with   Fatigue    Follow up   Abdominal Pain    Follow up  Patient here for follow up of fatigue and epigastric pain  Fatigue:  Patient was seen by Dr. Ancil Boozer on 12/26/2021.  According to Dr. Ancil Boozer note: Patient had reported fatigue over the last few months.  Patient denied prior illness. She reported that she felt sleepy during the day and still sleepy at night. Labs were done which showed subclinical hyperthyroid and vitamin B12 deficiency. Patient has been coming in for monthly B12 injections.  Dr. Ancil Boozer also placed a referral for a sleep study.  Patient reports she has had a hard time getting in for sleep study. She says she has called three times and they told her they will call her when they have an opening.  Discussed referral to pulmonology to do at home sleep study.  Patient would prefer to do that. Referral placed.  ESS 9.  Patient reports that her fatigue has improved sightly with the B12 injections but she still is significantly fatigued. Will get vitamin D level.    Epigastric pain: Patient was seen for this by Dr. Ancil Boozer on 12/26/2021.  According to Dr. Ancil Boozer note: Patient had noticed some pain when bending forward to stand up and she was having soreness on her waist.  Patient had denied changes in her bowel movements.  Pain resolved by itself and it only lasts a couple of minutes.  Patient had been on meloxicam since May 2023.  Dr. Ancil Boozer instructed patient to discontinue meloxicam and take Tylenol for pain instead and to resume taking omeprazole every other day.  She also placed a referral to gastroenterology. Has an appointment with GI on 03/29/2022. Patient  states that she is still having the pain.  Will get H.pylori breath test.    Relevant past medical, surgical, family and social history reviewed and updated as indicated. Interim medical history since our last visit reviewed. Allergies and medications reviewed and updated.  Review of Systems  Constitutional: Negative for fever or weight change. Positive for fatigue Respiratory: Negative for cough and shortness of breath.   Cardiovascular: Negative for chest pain or palpitations.  Gastrointestinal:positive for abdominal pain, no bowel changes.  Musculoskeletal: Negative for gait problem or joint swelling.  Skin: Negative for rash.  Neurological: Negative for dizziness or headache.  No other specific complaints in a complete review of systems (except as listed in HPI above).      Objective:    BP 128/74   Pulse 97   Temp 97.9 F (36.6 C) (Oral)   Resp 16   Ht 5' 2"  (1.575 m)   Wt 177 lb 14.4 oz (80.7 kg)   SpO2 99%   BMI 32.54 kg/m   Wt Readings from Last 3 Encounters:  02/13/22 177 lb 14.4 oz (80.7 kg)  12/26/21 177 lb 8 oz (80.5 kg)  10/27/21 176 lb 8 oz (80.1 kg)    Physical Exam  Constitutional: Patient appears well-developed and well-nourished. Obese  No distress.  HEENT: head atraumatic, normocephalic, pupils equal and reactive to light,  neck supple Cardiovascular: Normal rate, regular rhythm and normal heart sounds.  No murmur heard. No BLE edema. Pulmonary/Chest: Effort normal and breath sounds normal. No respiratory distress. Abdominal: Soft.  There is no tenderness. Psychiatric: Patient has a normal mood and affect. behavior is normal. Judgment and thought content normal.  Results for orders placed or performed in visit on 12/26/21  Thyroid Panel With TSH  Result Value Ref Range   T3 Uptake 28 22 - 35 %   T4, Total 7.4 5.1 - 11.9 mcg/dL   Free Thyroxine Index 2.1 1.4 - 3.8   TSH 0.22 (L) 0.40 - 4.50 mIU/L  B12 and Folate Panel  Result Value Ref Range    Vitamin B-12 301 200 - 1,100 pg/mL   Folate >24.0 ng/mL  COMPLETE METABOLIC PANEL WITH GFR  Result Value Ref Range   Glucose, Bld 95 65 - 99 mg/dL   BUN 17 7 - 25 mg/dL   Creat 0.80 0.60 - 1.00 mg/dL   eGFR 78 > OR = 60 mL/min/1.80m   BUN/Creatinine Ratio NOT APPLICABLE 6 - 22 (calc)   Sodium 140 135 - 146 mmol/L   Potassium 3.8 3.5 - 5.3 mmol/L   Chloride 101 98 - 110 mmol/L   CO2 21 20 - 32 mmol/L   Calcium 10.4 8.6 - 10.4 mg/dL   Total Protein 7.8 6.1 - 8.1 g/dL   Albumin 4.8 3.6 - 5.1 g/dL   Globulin 3.0 1.9 - 3.7 g/dL (calc)   AG Ratio 1.6 1.0 - 2.5 (calc)   Total Bilirubin 0.5 0.2 - 1.2 mg/dL   Alkaline phosphatase (APISO) 75 37 - 153 U/L   AST 21 10 - 35 U/L   ALT 20 6 - 29 U/L  CBC with Differential/Platelet  Result Value Ref Range   WBC 8.7 3.8 - 10.8 Thousand/uL   RBC 5.40 (H) 3.80 - 5.10 Million/uL   Hemoglobin 16.2 (H) 11.7 - 15.5 g/dL   HCT 48.7 (H) 35.0 - 45.0 %   MCV 90.2 80.0 - 100.0 fL   MCH 30.0 27.0 - 33.0 pg   MCHC 33.3 32.0 - 36.0 g/dL   RDW 12.8 11.0 - 15.0 %   Platelets 391 140 - 400 Thousand/uL   MPV 10.7 7.5 - 12.5 fL   Neutro Abs 6,629 1,500 - 7,800 cells/uL   Lymphs Abs 1,523 850 - 3,900 cells/uL   Absolute Monocytes 409 200 - 950 cells/uL   Eosinophils Absolute 78 15 - 500 cells/uL   Basophils Absolute 61 0 - 200 cells/uL   Neutrophils Relative % 76.2 %   Total Lymphocyte 17.5 %   Monocytes Relative 4.7 %   Eosinophils Relative 0.9 %   Basophils Relative 0.7 %      Assessment & Plan:   Problem List Items Addressed This Visit   None Visit Diagnoses     Other fatigue    -  Primary   get sleep study, get vitamin d level, continue vitamin b 12 injections   Relevant Orders   Ambulatory referral to Pulmonology   VITAMIN D 25 Hydroxy (Vit-D Deficiency, Fractures)   Epigastric abdominal pain       has appointment with GI, continue omeprazole 20 mg every other day.  Will get h.pylori breath test.    Relevant Orders   H. pylori breath  test   Need for influenza vaccination       Relevant Orders  Flu Vaccine QUAD High Dose(Fluad) (Completed)        Follow up plan: Return for has appointment scheduled.

## 2022-02-16 ENCOUNTER — Other Ambulatory Visit: Payer: Self-pay | Admitting: Nurse Practitioner

## 2022-02-16 DIAGNOSIS — Z1231 Encounter for screening mammogram for malignant neoplasm of breast: Secondary | ICD-10-CM

## 2022-02-16 LAB — VITAMIN D 25 HYDROXY (VIT D DEFICIENCY, FRACTURES): Vit D, 25-Hydroxy: 47 ng/mL (ref 30–100)

## 2022-02-16 LAB — H. PYLORI BREATH TEST: H. pylori Breath Test: NOT DETECTED

## 2022-03-03 ENCOUNTER — Ambulatory Visit (INDEPENDENT_AMBULATORY_CARE_PROVIDER_SITE_OTHER): Payer: Medicare Other

## 2022-03-03 DIAGNOSIS — E538 Deficiency of other specified B group vitamins: Secondary | ICD-10-CM | POA: Diagnosis not present

## 2022-03-03 MED ORDER — CYANOCOBALAMIN 1000 MCG/ML IJ SOLN
1000.0000 ug | Freq: Once | INTRAMUSCULAR | Status: AC
  Administered 2022-03-03: 1000 ug via INTRAMUSCULAR

## 2022-03-12 ENCOUNTER — Other Ambulatory Visit: Payer: Self-pay | Admitting: Family Medicine

## 2022-03-12 DIAGNOSIS — K219 Gastro-esophageal reflux disease without esophagitis: Secondary | ICD-10-CM

## 2022-03-17 ENCOUNTER — Encounter: Payer: Self-pay | Admitting: Pulmonary Disease

## 2022-03-17 ENCOUNTER — Ambulatory Visit (INDEPENDENT_AMBULATORY_CARE_PROVIDER_SITE_OTHER): Payer: Medicare Other | Admitting: Pulmonary Disease

## 2022-03-17 VITALS — BP 126/74 | HR 82 | Temp 97.9°F | Ht 62.0 in | Wt 178.0 lb

## 2022-03-17 DIAGNOSIS — R0683 Snoring: Secondary | ICD-10-CM | POA: Diagnosis not present

## 2022-03-17 NOTE — Progress Notes (Signed)
[name]  Chief Complaint  Patient presents with   sleep consult    C/o gasping for air during, restless sleep and daytime sleepiness.     Past Surgical History:  She  has a past surgical history that includes Abdominal hysterectomy; Cholecystectomy; Knee arthroscopy (Right, 02/21/2008); Colonoscopy; Wisdom tooth extraction; Colonoscopy with propofol (N/A, 11/05/2015); Total hip arthroplasty (Right, 04/09/2018); Breast biopsy (Right, 11/03/2005); Cataract extraction w/PHACO (Right, 09/15/2020); Cataract extraction w/PHACO (Left, 09/29/2020); and Joint replacement (04-09-2018).  Past Medical History:  Abducens nerve palsy, OA, Cystocele, Depression, HTN, GERD, Glaucoma, CVA, Goiter with subclinical hyperthyroidism, Tremor  Constitutional:  BP 126/74 (BP Location: Left Arm, Cuff Size: Normal)   Pulse 82   Temp 97.9 F (36.6 C) (Temporal)   Ht '5\' 2"'$  (1.575 m)   Wt 178 lb (80.7 kg)   SpO2 100%   BMI 32.56 kg/m   Brief Summary:  Patricia Richardson is a 71 y.o. female with snoring and daytime sleepiness.      Subjective:   She has noticed trouble feeling sleep during the day.  This has been going on for a while.  She falls asleep if she is sitting quiet.  She snores some, and wakes up frequently during dreams.  She wakes up with a dry mouth.  She goes to sleep at 10 pm.  She falls asleep quickly.  She wakes up 3 or 4 times to use the bathroom.  She gets out of bed at 7 am.  She feels tired in the morning.  She denies morning headache.  She does not use anything to help her stay awake.  She tried melatonin gummies.  These help her fall asleep, but not stay asleep  She denies sleep walking, sleep talking, bruxism, or nightmares.  There is no history of restless legs.  She denies sleep hallucinations, sleep paralysis, or cataplexy.  The Epworth score is 13 out of 24.   Physical Exam:   Appearance - well kempt   ENMT - no sinus tenderness,  no oral exudate, no LAN, Mallampati 4 airway, no stridor  Respiratory - equal breath sounds bilaterally, no wheezing or rales  CV - s1s2 regular rate and rhythm, no murmurs  Ext - no clubbing, no edema  Skin - no rashes  Psych - normal mood and affect   Sleep Tests:    Social History:  She  reports that she has never smoked. She has never used smokeless tobacco. She reports current alcohol use. She reports that she does not use drugs.  Family History:  Her family history includes Breast cancer in her maternal aunt; Diabetes in her brother, mother, and paternal grandmother; Hypertension in her father; Kidney disease in her father and mother.    Discussion:  She has snoring, sleep disruption, apnea, and daytime sleepiness.  She has history of hypertension, depression, and previous stroke.  I am concerned she could have obstructive sleep apnea.  Assessment/Plan:   Snoring with excessive daytime sleepiness. - will need to arrange for a home sleep study  Subclinical hyperthyroidism with multinodular goiter. - followed by Dr. Felipa Evener Solum with [facility]  Tremor and dysphonia with possible Parkinson's disease. - followed by Dr. Gurney Maxin with [facility] Neurology  Obesity. - discussed how weight can impact sleep and risk for sleep disordered breathing - discussed options to assist with weight loss: combination of diet modification, cardiovascular and strength training exercises  Cardiovascular risk. - had an extensive discussion  regarding the adverse health consequences related to untreated sleep disordered breathing - specifically discussed the risks for hypertension, coronary artery disease, cardiac dysrhythmias, cerebrovascular disease, and diabetes - lifestyle modification discussed  Safe driving practices. - discussed how sleep disruption can increase risk of accidents, particularly when driving - safe driving practices were  discussed  Therapies for obstructive sleep apnea. - if the sleep study shows significant sleep apnea, then various therapies for treatment were reviewed: CPAP, oral appliance, and surgical interventions  Time Spent Involved in Patient Care on Day of Examination:  36 minutes  Follow up:   Patient Instructions  Will arrange for home sleep study Will call to arrange for follow up after sleep study reviewed   Medication List:   Allergies as of 03/17/2022       Reactions   Codeine Itching   Pin and needles         Medication List        Accurate as of March 17, 2022 10:21 AM. If you have any questions, ask your nurse or doctor.          acetaminophen 500 MG tablet Commonly known as: TYLENOL Take 1-2 tablets (500-1,000 mg total) by mouth every 6 (six) hours as needed for mild pain (pain score 1-3 or temp > 100.5).   amLODipine 10 MG tablet Commonly known as: NORVASC Take 1 tablet (10 mg total) by mouth daily.   aspirin EC 81 MG tablet Take 1 tablet (81 mg total) by mouth daily.   atorvastatin 10 MG tablet Commonly known as: LIPITOR TAKE 1 TABLET BY MOUTH AT  BEDTIME 2 NIGHTS PER WEEK   carbidopa-levodopa 25-100 MG tablet Commonly known as: SINEMET IR Take 1 tablet by mouth 3 (three) times daily.   gabapentin 100 MG capsule Commonly known as: NEURONTIN Take 100 mg by mouth 4 (four) times daily.   hydrochlorothiazide 25 MG tablet Commonly known as: HYDRODIURIL TAKE 1 TABLET BY MOUTH DAILY   methimazole 5 MG tablet Commonly known as: TAPAZOLE Take by mouth.   omeprazole 20 MG capsule Commonly known as: PRILOSEC TAKE 1 CAPSULE BY MOUTH EVERY  OTHER DAY   OVER THE COUNTER MEDICATION Take 1 tablet by mouth daily as needed (constipation). Swiss Kriss herbal laxative        Signature:  Chesley Mires, MD [place] Pager - [phone] 03/17/2022, 10:21 AM

## 2022-03-17 NOTE — Patient Instructions (Signed)
Will arrange for home sleep study Will call to arrange for follow up after sleep study reviewed  

## 2022-03-20 ENCOUNTER — Encounter: Payer: Self-pay | Admitting: Nurse Practitioner

## 2022-03-20 ENCOUNTER — Other Ambulatory Visit: Payer: Self-pay

## 2022-03-20 ENCOUNTER — Ambulatory Visit (INDEPENDENT_AMBULATORY_CARE_PROVIDER_SITE_OTHER): Payer: Medicare Other | Admitting: Nurse Practitioner

## 2022-03-20 VITALS — BP 122/74 | HR 86 | Temp 98.1°F | Resp 16 | Ht 62.0 in | Wt 176.4 lb

## 2022-03-20 DIAGNOSIS — Z0000 Encounter for general adult medical examination without abnormal findings: Secondary | ICD-10-CM

## 2022-03-20 DIAGNOSIS — E782 Mixed hyperlipidemia: Secondary | ICD-10-CM | POA: Diagnosis not present

## 2022-03-20 DIAGNOSIS — I10 Essential (primary) hypertension: Secondary | ICD-10-CM | POA: Diagnosis not present

## 2022-03-20 DIAGNOSIS — R7303 Prediabetes: Secondary | ICD-10-CM

## 2022-03-20 NOTE — Progress Notes (Signed)
Name: Patricia Richardson   MRN: [id number]    DOB: 24-Oct-1950   Date:03/20/2022       Progress Note  Subjective  Chief Complaint  Chief Complaint  Patient presents with   Annual Exam    HPI  Patient presents for annual CPE.  Diet: well balanced diet, with variety Exercise: not much, discussed recommendations of 150 min a week  Sleep: 6-7 hours, sometimes 3 hours Last dental exam:two months ago Last eye exam: December she is scheduled for an appointment  [facility] Office Visit from 03/20/2022 in [facility]  AUDIT-C Score 0      Depression: Phq 9 is  negative    03/20/2022    8:08 AM 02/13/2022    9:08 AM 02/13/2022    9:06 AM 12/26/2021    1:58 PM 10/27/2021    3:11 PM  Depression screen PHQ 2/9  Decreased Interest 0 0 0 0 0  Down, Depressed, Hopeless 0 0 0 0 0  PHQ - 2 Score 0 0 0 0 0  Altered sleeping    0 0  Tired, decreased energy    3 0  Change in appetite    0 0  Feeling bad or failure about yourself     0 0  Trouble concentrating    0 0  Moving slowly or fidgety/restless    0 0  Suicidal thoughts    0 0  PHQ-9 Score    3 0   Hypertension: BP Readings from Last 3 Encounters:  03/20/22 122/74  03/17/22 126/74  02/13/22 128/74   Obesity: Wt Readings from Last 3 Encounters:  03/20/22 176 lb 6.4 oz (80 kg)  03/17/22 178 lb (80.7 kg)  02/13/22 177 lb 14.4 oz (80.7 kg)   BMI Readings from Last 3 Encounters:  03/20/22 32.26 kg/m  03/17/22 32.56 kg/m  02/13/22 32.54 kg/m     Vaccines:  HPV: up to at age 74 , ask insurance if age between 50-45  Shingrix: 59-64 yo and ask insurance if covered when patient above 63 yo Pneumonia:  educated and discussed with patient. Flu:  educated and discussed with patient.  Hep C Screening: 11/04/2018 STD testing and prevention (HIV/chl/gon/syphilis): 11/04/2018 Intimate partner violence:none Sexual History : none Menstrual History/LMP/Abnormal Bleeding: hysterectomy Incontinence Symptoms:  none  Breast cancer:  - Last Mammogram: 04/01/2021,scheduled - BRCA gene screening: none  Osteoporosis: Discussed high calcium and vitamin D supplementation, weight bearing exercises  Cervical cancer screening: aged out/hysterectomy  Skin cancer: Discussed monitoring for atypical lesions  Colorectal cancer: 03/28/2019, due, she says it is scheduled Lung cancer:   Low Dose CT Chest recommended if Age 22-80 years, 20 pack-year currently smoking OR have quit w/in 15years. Patient does not qualify.   ECG: 03/29/2018  Advanced Care Planning: A voluntary discussion about advance care planning including the explanation and discussion of advance directives.  Discussed health care proxy and Living will, and the patient was able to identify a health care proxy as son.  Patient does have a living will at present time. If patient does have living will, I have requested they bring this to the clinic to be scanned in to their chart.  Lipids: Lab Results  Component Value Date   CHOL 155 03/14/2021   CHOL 147 11/30/2020   CHOL 149 05/25/2020   Lab Results  Component Value Date   HDL 59 03/14/2021   HDL 59 11/30/2020   HDL 55 05/25/2020   Lab Results  Component Value  Date   LDLCALC 75 03/14/2021   LDLCALC 68 11/30/2020   LDLCALC 75 05/25/2020   Lab Results  Component Value Date   TRIG 130 03/14/2021   TRIG 112 11/30/2020   TRIG 107 05/25/2020   Lab Results  Component Value Date   CHOLHDL 2.6 03/14/2021   CHOLHDL 2.5 11/30/2020   CHOLHDL 2.7 05/25/2020   No results found for: "LDLDIRECT"  Glucose: Glucose, Bld  Date Value Ref Range Status  12/26/2021 95 65 - 99 mg/dL Final    Comment:    .            Fasting reference interval .   03/14/2021 141 (H) 65 - 99 mg/dL Final    Comment:    .            Fasting reference interval . For someone without known diabetes, a glucose value >125 mg/dL indicates that they may have diabetes and this should be confirmed with  a follow-up test. .   11/30/2020 82 65 - 99 mg/dL Final    Comment:    .            Fasting reference interval .    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   Prediabetes 06/20/2018   At risk for heart disease 06/20/2018   Essential hypertension 06/17/2018   Hx of completed stroke 06/17/2018   Obesity (BMI 35.0-39.9 without comorbidity) 06/17/2018   Status post total hip replacement, 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   Primary osteoarthritis of right hip 03/12/2017    Past Surgical History:  Procedure Laterality Date   ABDOMINAL HYSTERECTOMY     BREAST BIOPSY Right 11/03/2005   core biopsy w/ clip placement   CATARACT EXTRACTION W/PHACO Right 09/15/2020   Procedure: CATARACT EXTRACTION PHACO AND INTRAOCULAR LENS PLACEMENT ([place]) RIGHT;  Surgeon: Leandrew Koyanagi, MD;  Location: [place];  Service: Ophthalmology;  Laterality: Right;  4.70 0:57.4 8.2%   CATARACT EXTRACTION W/PHACO Left 09/29/2020   Procedure: CATARACT EXTRACTION PHACO AND INTRAOCULAR LENS PLACEMENT ([facility]) LEFT 3.01 00:45.7 6.6%;  Surgeon: Leandrew Koyanagi, MD;  Location: [place];  Service: Ophthalmology;  Laterality: Left;   CHOLECYSTECTOMY     COLONOSCOPY     COLONOSCOPY WITH PROPOFOL N/A 11/05/2015   Procedure: COLONOSCOPY WITH PROPOFOL;  Surgeon: Lollie Sails, MD;  Location[facility] ENDOSCOPY;  Service: Endoscopy;  Laterality: N/A;   JOINT REPLACEMENT  04-09-2018   KNEE ARTHROSCOPY Right  02/21/2008   TOTAL HIP ARTHROPLASTY Right 04/09/2018   Procedure: TOTAL HIP ARTHROPLASTY ANTERIOR APPROACH;  Surgeon: Hessie Knows, MD;  Location: [facility] ORS;  Service: Orthopedics;  Laterality: Right;   WISDOM TOOTH EXTRACTION      Family History  Problem Relation Age of Onset   Breast cancer Maternal Aunt        great   Kidney disease Mother    Diabetes Mother    Hypertension Father    Kidney disease Father    Diabetes Brother    Diabetes Paternal Grandmother  Social History   Socioeconomic History   Marital status: Divorced    Spouse name: Not on file   Number of children: 2   Years of education: 12   Highest education level: Not on file  Occupational History   Not on file  Tobacco Use   Smoking status: Never   Smokeless tobacco: Never  Vaping Use   Vaping Use: Never used  Substance and Sexual Activity   Alcohol use: Yes    Comment: rarely   Drug use: No   Sexual activity: Not Currently  Other Topics Concern   Not on file  Social History Narrative   Pt lives alone   Social Determinants of Health   Financial Resource Strain: Low Risk  (03/20/2022)   Overall Financial Resource Strain (CARDIA)    Difficulty of Paying Living Expenses: Not hard at all  Food Insecurity: No Food Insecurity (03/20/2022)   Hunger Vital Sign    Worried About Running Out of Food in the Last Year: Never true    Ran Out of Food in the Last Year: Never true  Transportation Needs: No Transportation Needs (03/20/2022)   PRAPARE - Hydrologist (Medical): No    Lack of Transportation (Non-Medical): No  Physical Activity: Insufficiently Active (03/20/2022)   Exercise Vital Sign    Days of Exercise per Week: 2 days    Minutes of Exercise per Session: 10 min  Stress: No Stress Concern Present (03/20/2022)   [place]    Feeling of Stress : Only a little  Social Connections: Moderately Isolated  (03/20/2022)   Social Connection and Isolation Panel [NHANES]    Frequency of Communication with Friends and Family: Twice a week    Frequency of Social Gatherings with Friends and Family: Twice a week    Attends Religious Services: 1 to 4 times per year    Active Member of [employer] or Organizations: No    Attends Archivist Meetings: Never    Marital Status: Divorced  Human resources officer Violence: Not At Risk (03/20/2022)   Humiliation, Afraid, Rape, and Kick questionnaire    Fear of Current or Ex-Partner: No    Emotionally Abused: No    Physically Abused: No    Sexually Abused: No     Current Outpatient Medications:    amLODipine (NORVASC) 10 MG tablet, Take 1 tablet (10 mg total) by mouth daily., Disp: 90 tablet, Rfl: 1   aspirin EC 81 MG tablet, Take 1 tablet (81 mg total) by mouth daily., Disp: 90 tablet, Rfl: 3   atorvastatin (LIPITOR) 10 MG tablet, TAKE 1 TABLET BY MOUTH AT  BEDTIME 2 NIGHTS PER WEEK, Disp: 26 tablet, Rfl: 2   carbidopa-levodopa (SINEMET IR) 25-100 MG tablet, Take 1 tablet by mouth 3 (three) times daily., Disp: , Rfl:    gabapentin (NEURONTIN) 100 MG capsule, Take 100 mg by mouth 4 (four) times daily., Disp: , Rfl:    hydrochlorothiazide (HYDRODIURIL) 25 MG tablet, TAKE 1 TABLET BY MOUTH DAILY, Disp: 90 tablet, Rfl: 3   methimazole (TAPAZOLE) 5 MG tablet, Take by mouth., Disp: , Rfl:    omeprazole (PRILOSEC) 20 MG capsule, TAKE 1 CAPSULE BY MOUTH EVERY  OTHER DAY, Disp: 50 capsule, Rfl: 2   OVER THE COUNTER MEDICATION, Take 1 tablet by mouth daily as needed (constipation). Swiss Kriss herbal laxative, Disp: , Rfl:    acetaminophen (TYLENOL) 500 MG tablet, Take 1-2 tablets (500-1,000 mg total)  by mouth every 6 (six) hours as needed for mild pain (pain score 1-3 or temp > 100.5). (Patient not taking: Reported on 03/20/2022), Disp: , Rfl:   Allergies  Allergen Reactions   Codeine Itching    Pin and needles      ROS  Constitutional: Negative for fever or  weight change.  Respiratory: Negative for cough and shortness of breath.   Cardiovascular: Negative for chest pain or palpitations.  Gastrointestinal: Negative for abdominal pain, no bowel changes.  Musculoskeletal: Negative for gait problem or joint swelling.  Skin: Negative for rash.  Neurological: Negative for dizziness or headache.  No other specific complaints in a complete review of systems (except as listed in HPI above).   Objective  Vitals:   03/20/22 0802  BP: 122/74  Pulse: 86  Resp: 16  Temp: 98.1 F (36.7 C)  TempSrc: Oral  SpO2: 96%  Weight: 176 lb 6.4 oz (80 kg)  Height: 5' 2"  (1.575 m)    Body mass index is 32.26 kg/m.  Physical Exam  Constitutional: Patient appears well-developed and well-nourished. No distress.  HENT: Head: Normocephalic and atraumatic. Ears: B TMs ok, no erythema or effusion; Nose: Nose normal. Mouth/Throat: Oropharynx is clear and moist. No oropharyngeal exudate.  Eyes: Conjunctivae and EOM are normal. Pupils are equal, round, and reactive to light. No scleral icterus.  Neck: Normal range of motion. Neck supple. No JVD present. No thyromegaly present.  Cardiovascular: Normal rate, regular rhythm and normal heart sounds.  No murmur heard. No BLE edema. Pulmonary/Chest: Effort normal and breath sounds normal. No respiratory distress. Abdominal: Soft. Bowel sounds are normal, no distension. There is no tenderness. no masses Musculoskeletal: Normal range of motion, no joint effusions. No gross deformities Neurological: he is alert and oriented to person, place, and time. No cranial nerve deficit. Coordination, balance, strength, speech and gait are normal.  Skin: Skin is warm and dry. No rash noted. No erythema.  Psychiatric: Patient has a normal mood and affect. behavior is normal. Judgment and thought content normal.   Recent Results (from the past 2160 hour(s))  Thyroid Panel With TSH     Status: Abnormal   Collection Time: 12/26/21  3:14  PM  Result Value Ref Range   T3 Uptake 28 22 - 35 %   T4, Total 7.4 5.1 - 11.9 mcg/dL   Free Thyroxine Index 2.1 1.4 - 3.8   TSH 0.22 (L) 0.40 - 4.50 mIU/L  B12 and Folate Panel     Status: None   Collection Time: 12/26/21  3:14 PM  Result Value Ref Range   Vitamin B-12 301 200 - 1,100 pg/mL    Comment: . Please Note: Although the reference range for vitamin B12 is [phone] pg/mL, it has been reported that between 5 and 10% of patients with values between 200 and 400 pg/mL may experience neuropsychiatric and hematologic abnormalities due to occult B12 deficiency; less than 1% of patients with values above 400 pg/mL will have symptoms. .    Folate >24.0 ng/mL    Comment:                            Reference Range                            Low:           <3.4  Borderline:    3.4-5.4                            Normal:        >5.4 .   COMPLETE METABOLIC PANEL WITH GFR     Status: None   Collection Time: 12/26/21  3:14 PM  Result Value Ref Range   Glucose, Bld 95 65 - 99 mg/dL    Comment: .            Fasting reference interval .    BUN 17 7 - 25 mg/dL   Creat 0.80 0.60 - 1.00 mg/dL   eGFR 78 > OR = 60 mL/min/1.34m    Comment: The eGFR is based on the CKD-EPI 2021 equation. To calculate  the new eGFR from a previous Creatinine or Cystatin C result, go to https://www.kidney.org/professionals/ kdoqi/gfr%5Fcalculator    BUN/Creatinine Ratio NOT APPLICABLE 6 - 22 (calc)   Sodium 140 135 - 146 mmol/L   Potassium 3.8 3.5 - 5.3 mmol/L   Chloride 101 98 - 110 mmol/L   CO2 21 20 - 32 mmol/L   Calcium 10.4 8.6 - 10.4 mg/dL   Total Protein 7.8 6.1 - 8.1 g/dL   Albumin 4.8 3.6 - 5.1 g/dL   Globulin 3.0 1.9 - 3.7 g/dL (calc)   AG Ratio 1.6 1.0 - 2.5 (calc)   Total Bilirubin 0.5 0.2 - 1.2 mg/dL   Alkaline phosphatase (APISO) 75 37 - 153 U/L   AST 21 10 - 35 U/L   ALT 20 6 - 29 U/L  CBC with Differential/Platelet     Status: Abnormal   Collection Time:  12/26/21  3:14 PM  Result Value Ref Range   WBC 8.7 3.8 - 10.8 Thousand/uL   RBC 5.40 (H) 3.80 - 5.10 Million/uL   Hemoglobin 16.2 (H) 11.7 - 15.5 g/dL   HCT 48.7 (H) 35.0 - 45.0 %   MCV 90.2 80.0 - 100.0 fL   MCH 30.0 27.0 - 33.0 pg   MCHC 33.3 32.0 - 36.0 g/dL   RDW 12.8 11.0 - 15.0 %   Platelets 391 140 - 400 Thousand/uL   MPV 10.7 7.5 - 12.5 fL   Neutro Abs 6,629 1,500 - 7,800 cells/uL   Lymphs Abs 1,523 850 - 3,900 cells/uL   Absolute Monocytes 409 200 - 950 cells/uL   Eosinophils Absolute 78 15 - 500 cells/uL   Basophils Absolute 61 0 - 200 cells/uL   Neutrophils Relative % 76.2 %   Total Lymphocyte 17.5 %   Monocytes Relative 4.7 %   Eosinophils Relative 0.9 %   Basophils Relative 0.7 %  VITAMIN D 25 Hydroxy (Vit-D Deficiency, Fractures)     Status: None   Collection Time: 02/13/22  9:43 AM  Result Value Ref Range   Vit D, 25-Hydroxy 47 30 - 100 ng/mL    Comment: Vitamin D Status         25-OH Vitamin D: . Deficiency:                    <20 ng/mL Insufficiency:             20 - 29 ng/mL Optimal:                 > or = 30 ng/mL . For 25-OH Vitamin D testing on patients on  D2-supplementation and patients for whom quantitation  of D2 and D3 fractions is required, the  [facility](TM) 25-OH VIT D, (D2,D3), LC/MS/MS is recommended: order  code [phone] (patients >65yr). . See Note 1 . Note 1 . For additional information, please refer to  http://education.[facility].com/faq/FAQ199  (This link is being provided for informational/ educational purposes only.)   H. pylori breath test     Status: None   Collection Time: 02/13/22  9:43 AM  Result Value Ref Range   H. pylori Breath Test NOT DETECTED NOT DETECTED    Comment: . Antimicrobials, proton pump inhibitors, and bismuth preparations are known to suppress H. pylori, and  ingestion of these prior to H. pylori diagnostic testing may lead to false negative results. If clinically  indicated, the test may be  repeated on a new specimen obtained two weeks after discontinuing treatment. However, a positive result is still clinically valid.      Fall Risk:    03/20/2022    8:08 AM 02/13/2022    9:06 AM 12/26/2021    1:58 PM 10/27/2021    3:10 PM 04/05/2021    9:05 AM  Fall Risk   Falls in the past year? 0 0 0 0 0  Number falls in past yr: 0 0   0  Injury with Fall? 0 0   0  Risk for fall due to :   No Fall Risks No Fall Risks No Fall Risks  Follow up Falls evaluation completed Falls evaluation completed Falls prevention discussed Falls prevention discussed Falls prevention discussed     Functional Status Survey: Is the patient deaf or have difficulty hearing?: No Does the patient have difficulty seeing, even when wearing glasses/contacts?: No Does the patient have difficulty concentrating, remembering, or making decisions?: No Does the patient have difficulty walking or climbing stairs?: No Does the patient have difficulty dressing or bathing?: No Does the patient have difficulty doing errands alone such as visiting a doctor's office or shopping?: No   Assessment & Plan  1. Annual physical exam  - Lipid panel - Hemoglobin A1c - CBC with Differential/Platelet - COMPLETE METABOLIC PANEL WITH GFR  2. Essential hypertension  - CBC with Differential/Platelet - COMPLETE METABOLIC PANEL WITH GFR  3. Prediabetes  - Hemoglobin A1c - COMPLETE METABOLIC PANEL WITH GFR  4. Mixed hyperlipidemia  - Lipid panel - COMPLETE METABOLIC PANEL WITH GFR   -USPSTF grade A and B recommendations reviewed with patient; age-appropriate recommendations, preventive care, screening tests, etc discussed and encouraged; healthy living encouraged; see AVS for patient education given to patient -Discussed importance of 150 minutes of physical activity weekly, eat two servings of fish weekly, eat one serving of tree nuts ( cashews, pistachios, pecans, almonds..[place] [employer] every other day, eat 6 servings of  fruit/vegetables daily and drink plenty of water and avoid sweet beverages.

## 2022-03-21 LAB — CBC WITH DIFFERENTIAL/PLATELET
Absolute Monocytes: 448 cells/uL (ref 200–950)
Basophils Absolute: 40 cells/uL (ref 0–200)
Basophils Relative: 0.5 %
Eosinophils Absolute: 64 cells/uL (ref 15–500)
Eosinophils Relative: 0.8 %
HCT: 46.9 % — ABNORMAL HIGH (ref 35.0–45.0)
Hemoglobin: 16 g/dL — ABNORMAL HIGH (ref 11.7–15.5)
Lymphs Abs: 1592 cells/uL (ref 850–3900)
MCH: 30.8 pg (ref 27.0–33.0)
MCHC: 34.1 g/dL (ref 32.0–36.0)
MCV: 90.4 fL (ref 80.0–100.0)
MPV: 10.2 fL (ref 7.5–12.5)
Monocytes Relative: 5.6 %
Neutro Abs: 5856 cells/uL (ref 1500–7800)
Neutrophils Relative %: 73.2 %
Platelets: 393 10*3/uL (ref 140–400)
RBC: 5.19 10*6/uL — ABNORMAL HIGH (ref 3.80–5.10)
RDW: 12.7 % (ref 11.0–15.0)
Total Lymphocyte: 19.9 %
WBC: 8 10*3/uL (ref 3.8–10.8)

## 2022-03-21 LAB — LIPID PANEL
Cholesterol: 172 mg/dL (ref <200)
HDL: 65 mg/dL (ref >=50)
LDL Cholesterol (Calc): 86 mg/dL (calc)
Non-HDL Cholesterol (Calc): 107 mg/dL (calc) (ref <130)
Total CHOL/HDL Ratio: 2.6 (calc) (ref <5.0)
Triglycerides: 118 mg/dL (ref <150)

## 2022-03-21 LAB — COMPLETE METABOLIC PANEL WITH GFR
AG Ratio: 1.8 (calc) (ref 1.0–2.5)
ALT: 15 U/L (ref 6–29)
AST: 13 U/L (ref 10–35)
Albumin: 4.9 g/dL (ref 3.6–5.1)
Alkaline phosphatase (APISO): 74 U/L (ref 37–153)
BUN: 17 mg/dL (ref 7–25)
CO2: 27 mmol/L (ref 20–32)
Calcium: 10.4 mg/dL (ref 8.6–10.4)
Chloride: 103 mmol/L (ref 98–110)
Creat: 0.78 mg/dL (ref 0.60–1.00)
Globulin: 2.8 g/dL (calc) (ref 1.9–3.7)
Glucose, Bld: 99 mg/dL (ref 65–99)
Potassium: 4.7 mmol/L (ref 3.5–5.3)
Sodium: 143 mmol/L (ref 135–146)
Total Bilirubin: 0.6 mg/dL (ref 0.2–1.2)
Total Protein: 7.7 g/dL (ref 6.1–8.1)
eGFR: 80 mL/min/{1.73_m2} (ref >=60)

## 2022-03-21 LAB — HEMOGLOBIN A1C
Hgb A1c MFr Bld: 5.9 % of total Hgb — ABNORMAL HIGH (ref <5.7)
Mean Plasma Glucose: 123 mg/dL
eAG (mmol/L): 6.8 mmol/L

## 2022-03-30 ENCOUNTER — Ambulatory Visit (INDEPENDENT_AMBULATORY_CARE_PROVIDER_SITE_OTHER): Payer: Medicare Other

## 2022-03-30 DIAGNOSIS — E538 Deficiency of other specified B group vitamins: Secondary | ICD-10-CM

## 2022-03-30 MED ORDER — CYANOCOBALAMIN 1000 MCG/ML IJ SOLN
1000.0000 ug | Freq: Once | INTRAMUSCULAR | Status: AC
  Administered 2022-03-30: 1000 ug via INTRAMUSCULAR

## 2022-04-03 ENCOUNTER — Encounter (INDEPENDENT_AMBULATORY_CARE_PROVIDER_SITE_OTHER): Payer: Self-pay

## 2022-04-06 ENCOUNTER — Ambulatory Visit (INDEPENDENT_AMBULATORY_CARE_PROVIDER_SITE_OTHER): Payer: Medicare Other

## 2022-04-06 DIAGNOSIS — Z0000 Encounter for general adult medical examination without abnormal findings: Secondary | ICD-10-CM | POA: Diagnosis not present

## 2022-04-06 NOTE — Progress Notes (Signed)
Subjective:   I connected with  Patricia Richardson on 04/06/22 by a audio enabled telemedicine application and verified that I am speaking with the correct person using two identifiers.  Patient Location: Home  Provider Location: Office/Clinic  I discussed the limitations of evaluation and management by telemedicine. The patient expressed understanding and agreed to proceed. Patricia Richardson is a 71 y.o. female who presents for Medicare Annual (Subsequent) preventive examination.  Review of Systems    Defer to PCP       Objective:    There were no vitals filed for this visit. There is no height or weight on file to calculate BMI.     04/05/2021    9:04 AM 09/29/2020    7:52 AM 09/15/2020    7:38 AM 03/04/2020    8:25 AM 10/29/2019    8:14 AM 03/04/2019    8:23 AM 04/09/2018    6:00 PM  Advanced Directives  Does Patient Have a Medical Advance Directive? Yes Yes Yes Yes Yes Yes Yes  Type of Paramedic of [place];Living will [place];Living will [place];Living will [place];Living will Living will;Healthcare Power of [place];Living will [place];Living will  Does patient want to make changes to medical advance directive?  No - Patient declined No - Patient declined    No - Patient declined  Copy of [place] in Chart? No - copy requested No - copy requested Yes - validated most recent copy scanned in chart (See row information) No - copy requested  No - copy requested     Current Medications (verified) Outpatient Encounter Medications as of 04/06/2022  Medication Sig   acetaminophen (TYLENOL) 500 MG tablet Take 1-2 tablets (500-1,000 mg total) by mouth every 6 (six) hours as needed for mild pain (pain score 1-3 or temp > 100.5). (Patient not taking: Reported on 03/20/2022)   amLODipine (NORVASC) 10 MG tablet Take 1 tablet (10 mg total) by  mouth daily.   aspirin EC 81 MG tablet Take 1 tablet (81 mg total) by mouth daily.   atorvastatin (LIPITOR) 10 MG tablet TAKE 1 TABLET BY MOUTH AT  BEDTIME 2 NIGHTS PER WEEK   carbidopa-levodopa (SINEMET IR) 25-100 MG tablet Take 1 tablet by mouth 3 (three) times daily.   gabapentin (NEURONTIN) 100 MG capsule Take 100 mg by mouth 4 (four) times daily.   hydrochlorothiazide (HYDRODIURIL) 25 MG tablet TAKE 1 TABLET BY MOUTH DAILY   methimazole (TAPAZOLE) 5 MG tablet Take by mouth.   omeprazole (PRILOSEC) 20 MG capsule TAKE 1 CAPSULE BY MOUTH EVERY  OTHER DAY   OVER THE COUNTER MEDICATION Take 1 tablet by mouth daily as needed (constipation). Swiss Kriss herbal laxative   No facility-administered encounter medications on file as of 04/06/2022.    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[place]) 10/2001   Past Surgical History:  Procedure Laterality Date   ABDOMINAL HYSTERECTOMY     BREAST BIOPSY Right 11/03/2005   core biopsy w/ clip placement   CATARACT EXTRACTION W/PHACO Right 09/15/2020   Procedure: CATARACT EXTRACTION PHACO AND  INTRAOCULAR LENS PLACEMENT (IOC) RIGHT;  Surgeon: Leandrew Koyanagi, MD;  Location: [place];  Service: Ophthalmology;  Laterality: Right;  4.70 0:57.4 8.2%   CATARACT EXTRACTION W/PHACO Left 09/29/2020   Procedure: CATARACT EXTRACTION PHACO AND INTRAOCULAR LENS PLACEMENT (IOC) LEFT 3.01 00:45.7 6.6%;  Surgeon: Leandrew Koyanagi, MD;  Location: [facility];  Service: Ophthalmology;  Laterality: Left;   CHOLECYSTECTOMY     COLONOSCOPY     COLONOSCOPY WITH PROPOFOL N/A 11/05/2015   Procedure: COLONOSCOPY WITH PROPOFOL;  Surgeon: Lollie Sails, MD;  Location:  [facility] ENDOSCOPY;  Service: Endoscopy;  Laterality: N/A;   JOINT REPLACEMENT  04-09-2018   KNEE ARTHROSCOPY Right 02/21/2008   TOTAL HIP ARTHROPLASTY Right 04/09/2018   Procedure: TOTAL HIP ARTHROPLASTY ANTERIOR APPROACH;  Surgeon: Hessie Knows, MD;  Location: [facility] ORS;  Service: Orthopedics;  Laterality: Right;   WISDOM TOOTH EXTRACTION     Family History  Problem Relation Age of Onset   Breast cancer Maternal Aunt        great   Kidney disease Mother    Diabetes Mother    Hypertension Father    Kidney disease Father    Diabetes Brother    Diabetes Paternal Grandmother    Social History   Socioeconomic History   Marital status: Divorced    Spouse name: Not on file   Number of children: 2   Years of education: 12   Highest education level: Not on file  Occupational History   Not on file  Tobacco Use   Smoking status: Never   Smokeless tobacco: Never  Vaping Use   Vaping Use: Never used  Substance and Sexual Activity   Alcohol use: Yes    Comment: rarely   Drug use: No   Sexual activity: Not Currently  Other Topics Concern   Not on file  Social History Narrative   Pt lives alone   Social Determinants of Health   Financial Resource Strain: Low Risk  (03/20/2022)   Overall Financial Resource Strain (CARDIA)    Difficulty of Paying Living Expenses: Not hard at all  Food Insecurity: No Food Insecurity (03/20/2022)   Hunger Vital Sign    Worried About Running Out of Food in the Last Year: Never true    Ran Out of Food in the Last Year: Never true  Transportation Needs: No Transportation Needs (03/20/2022)   PRAPARE - Hydrologist (Medical): No    Lack of Transportation (Non-Medical): No  Physical Activity: Insufficiently Active (03/20/2022)   Exercise Vital Sign    Days of Exercise per Week: 2 days    Minutes of Exercise per Session: 10 min  Stress: No Stress Concern Present (03/20/2022)   [place]    Feeling of Stress : Only a little  Social Connections: Unknown (03/20/2022)   Social Connection and Isolation Panel [NHANES]    Frequency of Communication with Friends and Family: Twice a week    Frequency of Social Gatherings with Friends and Family: Twice a week    Attends Religious Services: 1 to 4 times per year    Active Member of [employer] or Organizations: No    Attends Archivist Meetings: Never    Marital Status: Not on file  Recent Concern: Social Connections - Moderately Isolated (03/20/2022)   Social Connection and Isolation Panel [NHANES]    Frequency of Communication with Friends and Family: Twice a week    Frequency of  Social Gatherings with Friends and Family: Twice a week    Attends Religious Services: 1 to 4 times per year    Active Member of [employer] or Organizations: No    Attends Archivist Meetings: Never    Marital Status: Divorced    Tobacco Counseling Counseling given: Not Answered   Clinical Intake:                 Diabetic?No         Activities of Daily Living    03/20/2022    8:08 AM 02/13/2022    9:06 AM  In your present state of health, do you have any difficulty performing the following activities:  Hearing? 0 0  Vision? 0 0  Difficulty concentrating or making decisions? 0 0  Walking or climbing stairs? 0 0  Dressing or bathing? 0 0  Doing errands, shopping? 0 0    Patient Care Team: Bo Merino, FNP as PCP - General (Nurse Practitioner) Samara Deist, DPM as Referring Physician (Podiatry) Anabel Bene, MD as Referring Physician (Neurology)  Indicate any recent Medical Services you may have received from other than [facility] providers in the past year (date may be approximate).     Assessment:   This is a routine wellness examination for Patricia Richardson.  Hearing/Vision screen No results found.  Dietary issues and exercise activities discussed:     Goals Addressed    None   Depression Screen    03/20/2022    8:08 AM 02/13/2022    9:08 AM 02/13/2022    9:06 AM 12/26/2021    1:58 PM 10/27/2021    3:11 PM 04/05/2021    9:03 AM 03/14/2021    1:37 PM  PHQ 2/9 Scores  PHQ - 2 Score 0 0 0 0 0 0 0  PHQ- 9 Score    3 0  0    Fall Risk    03/20/2022    8:08 AM 02/13/2022    9:06 AM 12/26/2021    1:58 PM 10/27/2021    3:10 PM 04/05/2021    9:05 AM  [place] in the past year? 0 0 0 0 0  Number falls in past yr: 0 0   0  Injury with Fall? 0 0   0  Risk for fall due to :   No Fall Risks No Fall Risks No Fall Risks  Follow up Falls evaluation completed Falls evaluation completed Falls prevention discussed Falls prevention discussed Falls prevention discussed    FALL RISK PREVENTION PERTAINING TO THE HOME:  Any stairs in or around the home? Yes  If so, are there any without handrails? No  Home free of loose throw rugs in walkways, pet beds, electrical cords, etc? Yes  Adequate lighting in your home to reduce risk of falls? Yes   ASSISTIVE DEVICES UTILIZED TO PREVENT FALLS:  Life alert? No  Use of a cane, walker or w/c? No  Grab bars in the bathroom? Yes  Shower chair or bench in shower? Yes  Elevated toilet seat or a handicapped toilet? Yes   TIMED UP AND GO:  Was the test performed? No .  Length of time to ambulate 10 feet: n/a sec.    Cognitive Function:        03/04/2019    8:28 AM  6CIT Screen  What Year? 0 points  What month? 0 points  What time? 0 points  Count back from 20 0 points  Months in  reverse 0 points  Repeat phrase 0 points  Total Score 0 points    Immunizations Immunization History  Administered Date(s) Administered   Fluad Quad(high Dose 65+) 02/06/2019, 02/17/2020, 03/14/2021, 02/13/2022   Influenza Split 03/01/2015   Influenza, High Dose Seasonal PF 04/10/2018   Influenza-Unspecified 03/06/2014, 03/06/2016, 03/13/2017   PFIZER(Purple Top)SARS-COV-2 Vaccination 07/31/2019, 08/26/2019   Pneumococcal  Conjugate-13 02/06/2019   Pneumococcal Polysaccharide-23 02/25/2014, 03/13/2017   Tdap 03/01/2015   Zoster, Live 11/04/2015    TDAP status: Up to date  Flu Vaccine status: Up to date  Pneumococcal vaccine status: Up to date  Covid-19 vaccine status: Information provided on how to obtain vaccines.   Qualifies for Shingles Vaccine? Yes   Zostavax completed No   Shingrix Completed?: No.    Education has been provided regarding the importance of this vaccine. Patient has been advised to call insurance company to determine out of pocket expense if they have not yet received this vaccine. Advised may also receive vaccine at local pharmacy or Health Dept. Verbalized acceptance and understanding.  Screening Tests Health Maintenance  Topic Date Due   Zoster Vaccines- Shingrix (1 of 2) Never done   COVID-19 Vaccine (3 - Pfizer series) 10/21/2019   COLONOSCOPY (Pts 45-7yr Insurance coverage will need to be confirmed)  03/27/2022   MAMMOGRAM  04/01/2022   Medicare Annual Wellness (AWV)  04/07/2023   DEXA SCAN  03/11/2024   TETANUS/TDAP  02/28/2025   Pneumonia Vaccine 71 Years old  Completed   INFLUENZA VACCINE  Completed   Hepatitis C Screening  Completed   HPV VACCINES  Aged Out    Health Maintenance  Health Maintenance Due  Topic Date Due   Zoster Vaccines- Shingrix (1 of 2) Never done   COVID-19 Vaccine (3 - Pfizer series) 10/21/2019   COLONOSCOPY (Pts 45-469yrInsurance coverage will need to be confirmed)  03/27/2022   MAMMOGRAM  04/01/2022    Colorectal cancer screening: Type of screening: Colonoscopy. Completed 03/28/2019. Repeat every 3 years 04/21/22 scheduled  Mammogram status: Completed 04/18/2022 scheduled. Repeat every year  Bone Density status: Completed 03/12/2019. Results reflect: Bone density results: NORMAL. Repeat every 5 years.  Lung Cancer Screening: (Low Dose CT Chest recommended if Age 73-80ears, 30 pack-year currently smoking OR have quit w/in  15years.) does not qualify.   Lung Cancer Screening Referral: n/a  Additional Screening:  Hepatitis C Screening: does not qualify; Completed 11/04/2018  Vision Screening: Recommended annual ophthalmology exams for early detection of glaucoma and other disorders of the eye. Is the patient up to date with their annual eye exam?  Yes  Who is the provider or what is the name of the office in which the patient attends annual eye exams? [facility] provider, would they like to be referred to a provider to establish care? No .   Dental Screening: Recommended annual dental exams for proper oral hygiene  Community Resource Referral / Chronic Care Management: CRR required this visit?  No   CCM required this visit?  No      Plan:     I have personally reviewed and noted the following in the patient's chart:   Medical and social history Use of alcohol, tobacco or illicit drugs  Current medications and supplements including opioid prescriptions. Patient is not currently taking opioid prescriptions. Functional ability and status Nutritional status Physical activity Advanced directives List of other physicians Hospitalizations, surgeries, and ER visits in previous 12 months Vitals Screenings  to include cognitive, depression, and falls Referrals and appointments  In addition, I have reviewed and discussed with patient certain preventive protocols, quality metrics, and best practice recommendations. A written personalized care plan for preventive services as well as general preventive health recommendations were provided to patient.     [place], [place]   04/06/2022   Nurse Notes:   Patricia Richardson , Thank you for taking time to come for your Medicare Wellness Visit. I appreciate your ongoing commitment to your health goals. Please review the following plan we discussed and let me know if I can assist you in the future.   These are the goals we  discussed:  Goals      DIET - INCREASE WATER INTAKE     Recommend drinking 6-8 glasses of water per day      Increase physical activity     Recommend increasing physical activity to at least 3 days per week        This is a list of the screening recommended for you and due dates:  Health Maintenance  Topic Date Due   Zoster (Shingles) Vaccine (1 of 2) Never done   COVID-19 Vaccine (3 - Pfizer series) 10/21/2019   Colon Cancer Screening  03/27/2022   Mammogram  04/01/2022   Medicare Annual Wellness Visit  04/07/2023   DEXA scan (bone density measurement)  03/11/2024   Tetanus Vaccine  02/28/2025   Pneumonia Vaccine  Completed   Flu Shot  Completed   Hepatitis C Screening: USPSTF Recommendation to screen - Ages 18-79 yo.  Completed   HPV Vaccine  Aged Out

## 2022-04-11 ENCOUNTER — Telehealth: Payer: Self-pay | Admitting: Pulmonary Disease

## 2022-04-16 ENCOUNTER — Other Ambulatory Visit: Payer: Self-pay | Admitting: Nurse Practitioner

## 2022-04-17 NOTE — Telephone Encounter (Signed)
Refilled 01/13/2022 #[id number] rf Requested Prescriptions  Pending Prescriptions Disp Refills   amLODipine (NORVASC) 10 MG tablet [Pharmacy Med Name: amLODIPine Besylate 10 MG Oral Tablet] 100 tablet 2    Sig: TAKE 1 TABLET BY MOUTH DAILY     Cardiovascular: Calcium Channel Blockers 2 Passed - 04/16/2022 11:13 PM      Passed - Last BP in normal range    BP Readings from Last 1 Encounters:  03/20/22 122/74         Passed - Last Heart Rate in normal range    Pulse Readings from Last 1 Encounters:  03/20/22 86         Passed - Valid encounter within last 6 months    Recent Outpatient Visits           4 weeks ago Annual physical exam   [facility] Bo Merino, FNP   2 months ago Other fatigue   [place], FNP   3 months ago Other fatigue   [facility] Steele Sizer, MD   5 months ago Acute pain of right knee   [facility] Delsa Grana, PA-C   1 year ago Annual physical exam   [facility] Bo Merino, FNP       Future Appointments             In 5 months Reece Packer, Myna Hidalgo, [facility], [place]   In 11 months Reece Packer, Myna Hidalgo, [facility], [place]   In 12 months  [facility], [facility]

## 2022-04-18 ENCOUNTER — Ambulatory Visit
Admission: RE | Admit: 2022-04-18 | Discharge: 2022-04-18 | Disposition: A | Discharge status: Home / Self Care | Payer: Medicare Other | Source: Ambulatory Visit | Attending: Nurse Practitioner | Admitting: Nurse Practitioner

## 2022-04-18 DIAGNOSIS — Z1231 Encounter for screening mammogram for malignant neoplasm of breast: Secondary | ICD-10-CM | POA: Insufficient documentation

## 2022-04-19 NOTE — Telephone Encounter (Signed)
Patient has been scheduled to pick up HST machine on 05/02/22 @ 2:00pm and she is aware

## 2022-04-20 ENCOUNTER — Encounter: Payer: Self-pay | Admitting: Gastroenterology

## 2022-04-20 NOTE — H&P (Signed)
Pre-Procedure H&P   Patient ID: Patricia Richardson is a 71 y.o. female.  Gastroenterology Provider: Annamaria Helling, DO  Referring Provider: Laurine Blazer, PA PCP: Bo Merino, FNP  Date: 04/21/2022  HPI Patricia Richardson is a 71 y.o. female who presents today for Esophagogastroduodenoscopy and Colonoscopy for Dysphagia; surveillance-personal history of colon polyps .  Patient has had solid food dysphagia specifically meats as well as pills with upper esophageal sticking.  Occasional choking on liquids. She denies any issues with odynophagia.  15 lb weight loss within the last year.  No nausea or vomiting.  She has noted reflux however this is well controlled on her home omeprazole.  She is Goody powders or BC powders 3 times a week but has cut back recently after gabapentin increased.  BM every 2 to 3 days.  Regular laxative use.  No melena or hematochezia.  Last colonoscopy in October 2020 noting pandiverticulosis and internal hemorrhoids.  6 tubular adenomatous polyps were removed.  She also had tubular adenomatous polyps in 2017 as well. Brother with a history of colon polyps  H. pylori breath testing is negative.  Most recent lab work hemoglobin 16 MCV 90 platelets 93,000 creatinine 0.8  Status post hysterectomy and cholecystectomy  Patient with history of Parkinson's disease and tremor as well as history of anxiety and stroke.  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[place]) 10/2001    Past Surgical History:  Procedure Laterality Date   ABDOMINAL HYSTERECTOMY     BREAST BIOPSY Right 11/03/2005   core biopsy w/ clip placement   CATARACT EXTRACTION W/PHACO Right 09/15/2020   Procedure: CATARACT EXTRACTION PHACO AND INTRAOCULAR LENS  PLACEMENT ([place]) RIGHT;  Surgeon: Leandrew Koyanagi, MD;  Location: [place];  Service: Ophthalmology;  Laterality: Right;  4.70 0:57.4 8.2%   CATARACT EXTRACTION W/PHACO Left 09/29/2020   Procedure: CATARACT EXTRACTION PHACO AND INTRAOCULAR LENS PLACEMENT (IOC) LEFT 3.01 00:45.7 6.6%;  Surgeon: Leandrew Koyanagi, MD;  Location: [place];  Service: Ophthalmology;  Laterality: Left;   CHOLECYSTECTOMY     COLONOSCOPY     COLONOSCOPY WITH PROPOFOL N/A 11/05/2015   Procedure: COLONOSCOPY WITH PROPOFOL;  Surgeon: Lollie Sails, MD;  [facility];  Service: Endoscopy;  Laterality: N/A;   JOINT REPLACEMENT  04-09-2018   KNEE ARTHROSCOPY Right 02/21/2008   TOTAL HIP ARTHROPLASTY Right 04/09/2018   Procedure: TOTAL HIP ARTHROPLASTY ANTERIOR APPROACH;  Surgeon: Hessie Knows, MD;  Location: [facility] ORS;  Service: Orthopedics;  Laterality: Right;   WISDOM TOOTH EXTRACTION      Family History Brother-colon polyps No other h/o GI disease or malignancy  Review of Systems  Constitutional:  Negative for activity change, appetite change, chills, diaphoresis, fatigue, fever and unexpected weight change.  HENT:  Positive for trouble swallowing. Negative for voice change.   Respiratory:  Negative for shortness of breath and wheezing.   Cardiovascular:  Negative for chest pain, palpitations and leg swelling.  Gastrointestinal:  Negative for abdominal distention, abdominal pain, anal bleeding, blood in stool, constipation, diarrhea, nausea, rectal pain and vomiting.  Musculoskeletal:  Negative for arthralgias and myalgias.  Skin:  Negative for color change and pallor.  Neurological:  Negative for  dizziness, syncope and weakness.  Psychiatric/Behavioral:  Negative for confusion.   All other systems reviewed and are negative.    Medications No current facility-administered medications on file prior to encounter.   Current Outpatient Medications on File Prior to  Encounter  Medication Sig Dispense Refill   amLODipine (NORVASC) 10 MG tablet Take 1 tablet (10 mg total) by mouth daily. 90 tablet 1   aspirin EC 81 MG tablet Take 1 tablet (81 mg total) by mouth daily. 90 tablet 3   atorvastatin (LIPITOR) 10 MG tablet TAKE 1 TABLET BY MOUTH AT  BEDTIME 2 NIGHTS PER WEEK 26 tablet 2   carbidopa-levodopa (SINEMET IR) 25-100 MG tablet Take 1 tablet by mouth 3 (three) times daily.     gabapentin (NEURONTIN) 100 MG capsule Take 100 mg by mouth 4 (four) times daily.     hydrochlorothiazide (HYDRODIURIL) 25 MG tablet TAKE 1 TABLET BY MOUTH DAILY 90 tablet 3   methimazole (TAPAZOLE) 5 MG tablet Take by mouth.     omeprazole (PRILOSEC) 20 MG capsule TAKE 1 CAPSULE BY MOUTH EVERY  OTHER DAY 50 capsule 2   OVER THE COUNTER MEDICATION Take 1 tablet by mouth daily as needed (constipation). Swiss Kriss herbal laxative     acetaminophen (TYLENOL) 500 MG tablet Take 1-2 tablets (500-1,000 mg total) by mouth every 6 (six) hours as needed for mild pain (pain score 1-3 or temp > 100.5). (Patient not taking: Reported on 04/06/2022)      Pertinent medications related to GI and procedure were reviewed by me with the patient prior to the procedure   Current Facility-Administered Medications:    0.9 %  sodium chloride infusion, , Intravenous, Continuous, Annamaria Helling, DO, Last Rate: 20 mL/hr at 04/21/22 0811, 20 mL/hr at 04/21/22 7628      Allergies  Allergen Reactions   Codeine Itching    Pin and needles    Allergies were reviewed by me prior to the procedure  Objective   Body mass index is 32.37 kg/m. Vitals:   04/21/22 0757  BP: 124/61  Pulse: 93  Resp: 20  Temp: (!) 96.5 F (35.8 C)  TempSrc: Temporal  SpO2: 99%  Weight: 80.3 kg  Height: '5\' 2"'$  (1.575 m)     Physical Exam Vitals and nursing note reviewed.  Constitutional:      General: She is not in acute distress.    Appearance: Normal appearance. She is obese. She is not ill-appearing,  toxic-appearing or diaphoretic.  HENT:     Head: Normocephalic and atraumatic.     Nose: Nose normal.     Mouth/Throat:     Mouth: Mucous membranes are moist.     Pharynx: Oropharynx is clear.  Eyes:     General: No scleral icterus.    Extraocular Movements: Extraocular movements intact.  Cardiovascular:     Rate and Rhythm: Normal rate and regular rhythm.     Heart sounds: Normal heart sounds. No murmur heard.    No friction rub. No gallop.  Pulmonary:     Effort: Pulmonary effort is normal. No respiratory distress.     Breath sounds: Normal breath sounds. No wheezing, rhonchi or rales.  Abdominal:     General: Bowel sounds are normal. There is no distension.     Palpations: Abdomen is soft.     Tenderness: There is no abdominal tenderness. There is no guarding or rebound.  Musculoskeletal:     Cervical back: Neck supple.     Right lower  leg: No edema.     Left lower leg: No edema.  Skin:    General: Skin is warm and dry.     Coloration: Skin is not jaundiced or pale.  Neurological:     General: No focal deficit present.     Mental Status: She is alert and oriented to person, place, and time. Mental status is at baseline.     Comments: Hand tremor present  Psychiatric:        Mood and Affect: Mood normal.        Behavior: Behavior normal.        Thought Content: Thought content normal.        Judgment: Judgment normal.      Assessment:  Patricia Richardson is a 71 y.o. female  who presents today for Esophagogastroduodenoscopy and Colonoscopy for Dysphagia; surveillance-personal history of colon polyps .  Plan:  Esophagogastroduodenoscopy and Colonoscopy with possible intervention today  Esophagogastroduodenoscopy and Colonoscopy with possible biopsy, control of bleeding, polypectomy, and interventions as necessary has been discussed with the patient/patient representative. Informed consent was obtained from the patient/patient representative after explaining the  indication, nature, and risks of the procedure including but not limited to death, bleeding, perforation, missed neoplasm/lesions, cardiorespiratory compromise, and reaction to medications. Opportunity for questions was given and appropriate answers were provided. Patient/patient representative has verbalized understanding is amenable to undergoing the procedure.   Annamaria Helling, DO  [facility] Gastroenterology  Portions of the record may have been created with voice recognition software. Occasional wrong-word or 'sound-a-like' substitutions may have occurred due to the inherent limitations of voice recognition software.  Read the chart carefully and recognize, using context, where substitutions may have occurred.

## 2022-04-21 ENCOUNTER — Encounter
Admission: RE | Disposition: A | Discharge status: Home / Self Care | Payer: Self-pay | Source: Home / Self Care | Attending: Gastroenterology

## 2022-04-21 ENCOUNTER — Ambulatory Visit
Admission: RE | Admit: 2022-04-21 | Discharge: 2022-04-21 | Disposition: A | Discharge status: Home / Self Care | Payer: Medicare Other | Attending: Gastroenterology | Admitting: Gastroenterology

## 2022-04-21 ENCOUNTER — Ambulatory Visit: Payer: Medicare Other | Admitting: Anesthesiology

## 2022-04-21 ENCOUNTER — Encounter: Payer: Self-pay | Admitting: Gastroenterology

## 2022-04-21 DIAGNOSIS — K2970 Gastritis, unspecified, without bleeding: Secondary | ICD-10-CM | POA: Insufficient documentation

## 2022-04-21 DIAGNOSIS — Z86010 Personal history of colonic polyps: Secondary | ICD-10-CM | POA: Insufficient documentation

## 2022-04-21 DIAGNOSIS — I10 Essential (primary) hypertension: Secondary | ICD-10-CM | POA: Insufficient documentation

## 2022-04-21 DIAGNOSIS — D124 Benign neoplasm of descending colon: Secondary | ICD-10-CM | POA: Diagnosis not present

## 2022-04-21 DIAGNOSIS — D123 Benign neoplasm of transverse colon: Secondary | ICD-10-CM | POA: Insufficient documentation

## 2022-04-21 DIAGNOSIS — G709 Myoneural disorder, unspecified: Secondary | ICD-10-CM | POA: Insufficient documentation

## 2022-04-21 DIAGNOSIS — K224 Dyskinesia of esophagus: Secondary | ICD-10-CM | POA: Diagnosis not present

## 2022-04-21 DIAGNOSIS — G20A1 Parkinson's disease without dyskinesia, without mention of fluctuations: Secondary | ICD-10-CM | POA: Insufficient documentation

## 2022-04-21 DIAGNOSIS — D127 Benign neoplasm of rectosigmoid junction: Secondary | ICD-10-CM | POA: Diagnosis not present

## 2022-04-21 DIAGNOSIS — K640 First degree hemorrhoids: Secondary | ICD-10-CM | POA: Insufficient documentation

## 2022-04-21 DIAGNOSIS — Z79899 Other long term (current) drug therapy: Secondary | ICD-10-CM | POA: Diagnosis not present

## 2022-04-21 DIAGNOSIS — K219 Gastro-esophageal reflux disease without esophagitis: Secondary | ICD-10-CM | POA: Insufficient documentation

## 2022-04-21 DIAGNOSIS — Z83711 Family history of hyperplastic colon polyps: Secondary | ICD-10-CM | POA: Diagnosis not present

## 2022-04-21 DIAGNOSIS — Z1211 Encounter for screening for malignant neoplasm of colon: Secondary | ICD-10-CM | POA: Insufficient documentation

## 2022-04-21 DIAGNOSIS — K5730 Diverticulosis of large intestine without perforation or abscess without bleeding: Secondary | ICD-10-CM | POA: Insufficient documentation

## 2022-04-21 DIAGNOSIS — D120 Benign neoplasm of cecum: Secondary | ICD-10-CM | POA: Diagnosis not present

## 2022-04-21 DIAGNOSIS — R1310 Dysphagia, unspecified: Secondary | ICD-10-CM | POA: Diagnosis not present

## 2022-04-21 HISTORY — PX: COLONOSCOPY WITH PROPOFOL: SHX5780

## 2022-04-21 HISTORY — PX: ESOPHAGOGASTRODUODENOSCOPY (EGD) WITH PROPOFOL: SHX5813

## 2022-04-21 SURGERY — COLONOSCOPY WITH PROPOFOL
Anesthesia: General

## 2022-04-21 MED ORDER — PROPOFOL 10 MG/ML IV BOLUS
INTRAVENOUS | Status: AC
  Filled 2022-04-21: qty 20

## 2022-04-21 MED ORDER — PHENYLEPHRINE 80 MCG/ML (10ML) SYRINGE FOR IV PUSH (FOR BLOOD PRESSURE SUPPORT)
PREFILLED_SYRINGE | INTRAVENOUS | Status: DC | PRN
  Administered 2022-04-21: 80 ug via INTRAVENOUS

## 2022-04-21 MED ORDER — PROPOFOL 10 MG/ML IV BOLUS
INTRAVENOUS | Status: AC
  Filled 2022-04-21: qty 40

## 2022-04-21 MED ORDER — PROPOFOL 10 MG/ML IV BOLUS
INTRAVENOUS | Status: DC | PRN
  Administered 2022-04-21: 40 mg via INTRAVENOUS
  Administered 2022-04-21: 30 mg via INTRAVENOUS
  Administered 2022-04-21 (×8): 40 mg via INTRAVENOUS
  Administered 2022-04-21: 100 mg via INTRAVENOUS
  Administered 2022-04-21: 40 mg via INTRAVENOUS

## 2022-04-21 MED ORDER — SODIUM CHLORIDE 0.9 % IV SOLN
INTRAVENOUS | Status: DC
  Administered 2022-04-21: 20 mL/h via INTRAVENOUS

## 2022-04-21 MED ORDER — LIDOCAINE HCL (CARDIAC) PF 100 MG/5ML IV SOSY
PREFILLED_SYRINGE | INTRAVENOUS | Status: DC | PRN
  Administered 2022-04-21: 100 mg via INTRAVENOUS

## 2022-04-21 MED ORDER — PROPOFOL 1000 MG/100ML IV EMUL
INTRAVENOUS | Status: AC
  Filled 2022-04-21: qty 100

## 2022-04-21 NOTE — Anesthesia Preprocedure Evaluation (Signed)
Anesthesia Evaluation  Patient identified by MRN, date of birth, ID band Patient awake    Reviewed: Allergy & Precautions, NPO status , Patient's Chart, lab work & pertinent test results  History of Anesthesia Complications Negative for: history of anesthetic complications  Airway Mallampati: III  TM Distance: <3 FB Neck ROM: full    Dental  (+) Chipped   Pulmonary neg pulmonary ROS, neg shortness of breath   Pulmonary exam normal        Cardiovascular Exercise Tolerance: Good hypertension, (-) angina Normal cardiovascular exam     Neuro/Psych  PSYCHIATRIC DISORDERS       Neuromuscular disease CVA    GI/Hepatic Neg liver ROS,GERD  Controlled,,  Endo/Other  negative endocrine ROS    Renal/GU negative Renal ROS  negative genitourinary   Musculoskeletal   Abdominal   Peds  Hematology negative hematology ROS (+)   Anesthesia Other Findings Past Medical History: 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 No date: Arthritis No date: Cystocele No date: Depression 06/17/2018: Essential hypertension, benign No date: GERD (gastroesophageal reflux disease) No date: Glaucoma No date: History of chicken pox No date: Hypertension 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[place])  Past Surgical History: No date: ABDOMINAL HYSTERECTOMY 11/03/2005: BREAST BIOPSY; Right     Comment:  core biopsy w/ clip placement 09/15/2020: CATARACT EXTRACTION W/PHACO; Right     Comment:  Procedure: CATARACT EXTRACTION PHACO AND INTRAOCULAR               LENS PLACEMENT ([place]) RIGHT;  Surgeon: Leandrew Koyanagi, MD;  Location: [facility];  Service:               Ophthalmology;  Laterality: Right;  4.70 0:57.4 8.2% 09/29/2020: CATARACT EXTRACTION W/PHACO; Left     Comment:  Procedure: CATARACT EXTRACTION PHACO AND INTRAOCULAR               LENS PLACEMENT ([facility]) LEFT 3.01 00:45.7 6.6%;  Surgeon:               Leandrew Koyanagi, MD;   Location: [facility];              Service: Ophthalmology;  Laterality: Left; No date: CHOLECYSTECTOMY No date: COLONOSCOPY 11/05/2015: COLONOSCOPY WITH PROPOFOL; N/A     Comment:  Procedure: COLONOSCOPY WITH PROPOFOL;  Surgeon: Lollie Sails, MD;  Location: [facility] ENDOSCOPY;  Service:               Endoscopy;  Laterality: N/A; 04-09-2018: JOINT REPLACEMENT 02/21/2008: KNEE ARTHROSCOPY; Right 04/09/2018: TOTAL HIP ARTHROPLASTY; Right     Comment:  Procedure: TOTAL HIP ARTHROPLASTY ANTERIOR APPROACH;                Surgeon: Hessie Knows, MD;  Location: [facility] ORS;                Service: Orthopedics;  Laterality: Right; No date: WISDOM TOOTH EXTRACTION  BMI    Body Mass Index: 32.37 kg/m      Reproductive/Obstetrics negative OB ROS                             Anesthesia Physical Anesthesia Plan  ASA: 3  Anesthesia Plan: General   Post-op Pain Management:    Induction: Intravenous  PONV Risk Score and Plan: Propofol infusion and TIVA  Airway Management Planned: Natural Airway and Nasal Cannula  Additional Equipment:   Intra-op Plan:   Post-operative Plan:   Informed Consent: I have reviewed the patients History and Physical, chart, labs and discussed the procedure including the risks, benefits and alternatives for the proposed anesthesia with the patient or authorized representative who has indicated his/her understanding and acceptance.     Dental Advisory Given  Plan Discussed with: Anesthesiologist, CRNA and Surgeon  Anesthesia Plan Comments: (Patient consented for risks of anesthesia including but not limited to:  - adverse reactions to medications - risk of airway placement if required - damage to eyes, teeth, lips or other oral mucosa - nerve damage due to positioning  - sore throat or hoarseness - Damage to heart, brain, nerves, lungs, other parts of body or loss of life  Patient voiced understanding.)        Anesthesia Quick Evaluation

## 2022-04-21 NOTE — Op Note (Signed)
[facility] Gastroenterology Patient Name: Patricia Richardson Procedure Date: 04/21/2022 9:04 AM MRN: [id number] Account #: [id number] Date of Birth: 11/16/1950 Admit Type: Outpatient Age: 71 Room: [facility] ENDO ROOM 1 Gender: Female Note Status: Finalized Instrument Name: Colonoscope [id number] Procedure:             Colonoscopy Indications:           High risk colon cancer surveillance: Personal history                         of colonic polyps Providers:             Annamaria Helling DO, DO Medicines:             Monitored Anesthesia Care Complications:         No immediate complications. Estimated blood loss:                         Minimal. Procedure:             Pre-Anesthesia Assessment:                        - Prior to the procedure, a History and Physical was                         performed, and patient medications and allergies were                         reviewed. The patient is competent. The risks and                         benefits of the procedure and the sedation options and                         risks were discussed with the patient. All questions                         were answered and informed consent was obtained.                         Patient identification and proposed procedure were                         verified by the physician, the nurse, the anesthetist                         and the technician in the endoscopy suite. Mental                         Status Examination: alert and oriented. Airway                         Examination: normal oropharyngeal airway and neck                         mobility. Respiratory Examination: clear to                         auscultation. CV Examination: RRR, no murmurs, no S3  or S4. Prophylactic Antibiotics: The patient does not                         require prophylactic antibiotics. Prior                         Anticoagulants: The patient has taken no anticoagulant                          or antiplatelet agents. ASA Grade Assessment: III - A                         patient with severe systemic disease. After reviewing                         the risks and benefits, the patient was deemed in                         satisfactory condition to undergo the procedure. The                         anesthesia plan was to use monitored anesthesia care                         (MAC). Immediately prior to administration of                         medications, the patient was re-assessed for adequacy                         to receive sedatives. The heart rate, respiratory                         rate, oxygen saturations, blood pressure, adequacy of                         pulmonary ventilation, and response to care were                         monitored throughout the procedure. The physical                         status of the patient was re-assessed after the                         procedure.                        After obtaining informed consent, the colonoscope was                         passed under direct vision. Throughout the procedure,                         the patient's blood pressure, pulse, and oxygen                         saturations were monitored continuously. The  Colonoscope was introduced through the anus and                         advanced to the the terminal ileum, with                         identification of the appendiceal orifice and IC                         valve. The colonoscopy was somewhat difficult due to                         multiple diverticula in the colon and a redundant                         colon. Successful completion of the procedure was                         aided by applying abdominal pressure and lavage. The                         patient tolerated the procedure well. The quality of                         the bowel preparation was evaluated using the BBPS                         [facility]  Bowel Preparation Scale) with scores of: Right                         Colon = 2 (minor amount of residual staining, small                         fragments of stool and/or opaque liquid, but mucosa                         seen well), Transverse Colon = 3 (entire mucosa seen                         well with no residual staining, small fragments of                         stool or opaque liquid) and Left Colon = 3 (entire                         mucosa seen well with no residual staining, small                         fragments of stool or opaque liquid). The total BBPS                         score equals 8. The quality of the bowel preparation                         was excellent. The terminal ileum, ileocecal valve,  appendiceal orifice, and rectum were photographed. Findings:      The perianal and digital rectal examinations were normal. Pertinent       negatives include normal sphincter tone.      The terminal ileum appeared normal. Estimated blood loss: none.      Multiple small-mouthed diverticula were found in the entire colon.       severe in left colon causing edematous folds as well. Estimated blood       loss: none.      Non-bleeding internal hemorrhoids were found during retroflexion. The       hemorrhoids were Grade I (internal hemorrhoids that do not prolapse).       Estimated blood loss: none.      Five sessile polyps were found in the descending colon (2), transverse       colon (2) and ascending colon. The polyps were 3 to 5 mm in size. These       polyps were removed with a cold snare. Resection and retrieval were       complete. Estimated blood loss was minimal.      Five sessile polyps were found in the rectum, transverse colon (2) and       cecum (2). The polyps were 1 to 2 mm in size. These polyps were removed       with a jumbo cold forceps. Resection and retrieval were complete.       Estimated blood loss was minimal.      Retroflexion in  the right colon was performed.      The exam was otherwise without abnormality on direct and retroflexion       views. Impression:            - The examined portion of the ileum was normal.                        - Diverticulosis in the entire examined colon.                        - Non-bleeding internal hemorrhoids.                        - Five 3 to 5 mm polyps in the descending colon, in                         the transverse colon and in the ascending colon,                         removed with a cold snare. Resected and retrieved.                        - Five 1 to 2 mm polyps in the rectum, in the                         transverse colon and in the cecum, removed with a                         jumbo cold forceps. Resected and retrieved.                        - The examination was otherwise normal on direct and  retroflexion views. Recommendation:        - Patient has a contact number available for                         emergencies. The signs and symptoms of potential                         delayed complications were discussed with the patient.                         Return to normal activities tomorrow. Written                         discharge instructions were provided to the patient.                        - Discharge patient to home.                        - Resume previous diet.                        - Continue present medications.                        - No ibuprofen, naproxen, or other non-steroidal                         anti-inflammatory drugs for 5 days after polyp removal.                        - Await pathology results.                        - Repeat colonoscopy for surveillance based on                         pathology results.                        - Return to referring physician as previously                         scheduled.                        - Return to GI clinic as previously scheduled.                        - The  findings and recommendations were discussed with                         the patient's family.                        - The findings and recommendations were discussed with                         the patient. Procedure Code(s):     --- Professional ---  45385, Colonoscopy, flexible; with removal of                         tumor(s), polyp(s), or other lesion(s) by snare                         technique                        45380, 59, Colonoscopy, flexible; with biopsy, single                         or multiple Diagnosis Code(s):     --- Professional ---                        Z86.010, Personal history of colonic polyps                        K64.0, First degree hemorrhoids                        D12.4, Benign neoplasm of descending colon                        D12.2, Benign neoplasm of ascending colon                        D12.8, Benign neoplasm of rectum                        D12.3, Benign neoplasm of transverse colon (hepatic                         flexure or splenic flexure)                        D12.0, Benign neoplasm of cecum                        K57.30, Diverticulosis of large intestine without                         perforation or abscess without bleeding CPT copyright 2022 American Medical Association. All rights reserved. The codes documented in this report are preliminary and upon coder review may  be revised to meet current compliance requirements. Attending Participation:      I personally performed the entire procedure. Volney American, DO Annamaria Helling DO, DO 04/21/2022 10:18:28 AM This report has been signed electronically. Number of Addenda: 0 Note Initiated On: 04/21/2022 9:04 AM Scope Withdrawal Time: 0 hours 27 minutes 40 seconds  Total Procedure Duration: 0 hours 38 minutes 53 seconds  Estimated Blood Loss:  Estimated blood loss was minimal.      [facility]

## 2022-04-21 NOTE — Anesthesia Postprocedure Evaluation (Signed)
Anesthesia Post Note  Patient: [name]  Procedure(s) Performed: COLONOSCOPY WITH PROPOFOL ESOPHAGOGASTRODUODENOSCOPY (EGD) WITH PROPOFOL  Patient location during evaluation: Endoscopy Anesthesia Type: General Level of consciousness: awake and alert Pain management: pain level controlled Vital Signs Assessment: post-procedure vital signs reviewed and stable Respiratory status: spontaneous breathing, nonlabored ventilation, respiratory function stable and patient connected to nasal cannula oxygen Cardiovascular status: blood pressure returned to baseline and stable Postop Assessment: no apparent nausea or vomiting Anesthetic complications: no   No notable events documented.   Last Vitals:  Vitals:   04/21/22 1034 04/21/22 1044  BP: 102/62   Pulse: 80 82  Resp: 14 17  Temp:    SpO2: 100% 99%    Last Pain:  Vitals:   04/21/22 1044  TempSrc:   PainSc: 0-No pain                 Precious Haws Dareth Andrew

## 2022-04-21 NOTE — Interval H&P Note (Signed)
History and Physical Interval Note: Preprocedure H&P from 04/21/22  was reviewed and there was no interval change after seeing and examining the patient.  Written consent was obtained from the patient after discussion of risks, benefits, and alternatives. Patient has consented to proceed with Esophagogastroduodenoscopy and Colonoscopy with possible intervention   04/21/2022 9:15 AM  Patricia Richardson  has presented today for surgery, with the diagnosis of Dysphagia PH COlon Polyps.  The various methods of treatment have been discussed with the patient and family. After consideration of risks, benefits and other options for treatment, the patient has consented to  Procedure(s): COLONOSCOPY WITH PROPOFOL (N/A) ESOPHAGOGASTRODUODENOSCOPY (EGD) WITH PROPOFOL (N/A) as a surgical intervention.  The patient's history has been reviewed, patient examined, no change in status, stable for surgery.  I have reviewed the patient's chart and labs.  Questions were answered to the patient's satisfaction.     Annamaria Helling

## 2022-04-21 NOTE — Op Note (Signed)
[facility] Gastroenterology Patient Name: Patricia Richardson Procedure Date: 04/21/2022 9:04 AM MRN: [id number] Account #: [id number] Date of Birth: February 10, 1951 Admit Type: Outpatient Age: 71 Room: [facility] ENDO ROOM 1 Gender: Female Note Status: Finalized Instrument Name: Upper Endoscope [id number] Procedure:             Upper GI endoscopy Indications:           Dysphagia Providers:             Annamaria Helling DO, DO Medicines:             Monitored Anesthesia Care Complications:         No immediate complications. Estimated blood loss:                         Minimal. Procedure:             Pre-Anesthesia Assessment:                        - Prior to the procedure, a History and Physical was                         performed, and patient medications and allergies were                         reviewed. The patient is competent. The risks and                         benefits of the procedure and the sedation options and                         risks were discussed with the patient. All questions                         were answered and informed consent was obtained.                         Patient identification and proposed procedure were                         verified by the physician, the nurse, the anesthetist                         and the technician in the endoscopy suite. Mental                         Status Examination: alert and oriented. Airway                         Examination: normal oropharyngeal airway and neck                         mobility. Respiratory Examination: clear to                         auscultation. CV Examination: RRR, no murmurs, no S3                         or S4. Prophylactic Antibiotics: The patient does not  require prophylactic antibiotics. Prior                         Anticoagulants: The patient has taken no anticoagulant                         or antiplatelet agents. ASA Grade Assessment: III - A                          patient with severe systemic disease. After reviewing                         the risks and benefits, the patient was deemed in                         satisfactory condition to undergo the procedure. The                         anesthesia plan was to use monitored anesthesia care                         (MAC). Immediately prior to administration of                         medications, the patient was re-assessed for adequacy                         to receive sedatives. The heart rate, respiratory                         rate, oxygen saturations, blood pressure, adequacy of                         pulmonary ventilation, and response to care were                         monitored throughout the procedure. The physical                         status of the patient was re-assessed after the                         procedure.                        After obtaining informed consent, the endoscope was                         passed under direct vision. Throughout the procedure,                         the patient's blood pressure, pulse, and oxygen                         saturations were monitored continuously. The Endoscope                         was introduced through the mouth, and advanced to the  second part of duodenum. The upper GI endoscopy was                         accomplished without difficulty. The patient tolerated                         the procedure well. Findings:      The duodenal bulb, first portion of the duodenum and second portion of       the duodenum were normal. Estimated blood loss: none.      The entire examined stomach was normal. Biopsies were taken with a cold       forceps for Helicobacter pylori testing. Estimated blood loss was       minimal.      The Z-line was regular. Estimated blood loss: none.      Esophagogastric landmarks were identified: the gastroesophageal junction       was found at 36 cm from the  incisors.      Normal mucosa was found in the entire esophagus. Biopsies were taken       with a cold forceps for histology. Estimated blood loss was minimal. The       scope was withdrawn. Dilation was performed with a Maloney dilator with       no resistance at 52 Fr. The dilation site was examined following       endoscope reinsertion and showed no change. Estimated blood loss: none.      Abnormal motility was noted in the esophagus. The cricopharyngeus was       normal. There is spasticity of the esophageal body. The distal       esophagus/lower esophageal sphincter is open. Tertiary peristaltic waves       are noted. Estimated blood loss: none. Impression:            - Normal duodenal bulb, first portion of the duodenum                         and second portion of the duodenum.                        - Normal stomach. Biopsied.                        - Z-line regular.                        - Esophagogastric landmarks identified.                        - Normal mucosa was found in the entire esophagus.                         Biopsied. Dilated.                        - Abnormal esophageal motility. Recommendation:        - Patient has a contact number available for                         emergencies. The signs and symptoms of potential  delayed complications were discussed with the patient.                         Return to normal activities tomorrow. Written                         discharge instructions were provided to the patient.                        - Discharge patient to home.                        - Resume previous diet, moisten and chew food well.                         small bites. indefinitely.                        - Continue present medications.                        - Await pathology results.                        - Return to GI clinic as previously scheduled.                        - consider manometry and motility study                         - continue acid suppressive therapy                        - proceed with colonoscopy                        - The findings and recommendations were discussed with                         the patient.                        - The findings and recommendations were discussed with                         the patient's family. Procedure Code(s):     --- Professional ---                        [phone], Esophagogastroduodenoscopy, flexible,                         transoral; with biopsy, single or multiple                        43450, Dilation of esophagus, by unguided sound or                         bougie, single or multiple passes Diagnosis Code(s):     --- Professional ---                        K22.4, Dyskinesia of  esophagus                        R13.10, Dysphagia, unspecified CPT copyright 2022 American Medical Association. All rights reserved. The codes documented in this report are preliminary and upon coder review may  be revised to meet current compliance requirements. Attending Participation:      I personally performed the entire procedure. Volney American, DO Annamaria Helling DO, DO 04/21/2022 9:30:11 AM This report has been signed electronically. Number of Addenda: 0 Note Initiated On: 04/21/2022 9:04 AM Estimated Blood Loss:  Estimated blood loss was minimal.      [facility]

## 2022-04-21 NOTE — Transfer of Care (Signed)
Immediate Anesthesia Transfer of Care Note  Patient: Patricia Richardson  Procedure(s) Performed: COLONOSCOPY WITH PROPOFOL ESOPHAGOGASTRODUODENOSCOPY (EGD) WITH PROPOFOL  Patient Location: Endoscopy Unit  Anesthesia Type:General  Level of Consciousness: drowsy  Airway & Oxygen Therapy: Patient Spontanous Breathing and Patient connected to nasal cannula oxygen  Post-op Assessment: Report given to RN, Post -op Vital signs reviewed and stable, and Patient moving all extremities  Post vital signs: Reviewed and stable  Last Vitals:  Vitals Value Taken Time  BP 115/71 04/21/22 1015  Temp 35.8 C 04/21/22 1014  Pulse 77 04/21/22 1015  Resp 12 04/21/22 1015  SpO2 99 % 04/21/22 1015  Vitals shown include unvalidated device data.  Last Pain:  Vitals:   04/21/22 1014  TempSrc: Temporal  PainSc: Asleep         Complications: No notable events documented.

## 2022-04-24 LAB — SURGICAL PATHOLOGY

## 2022-05-02 ENCOUNTER — Ambulatory Visit (INDEPENDENT_AMBULATORY_CARE_PROVIDER_SITE_OTHER): Payer: Medicare Other

## 2022-05-02 ENCOUNTER — Ambulatory Visit: Payer: Medicare Other

## 2022-05-02 DIAGNOSIS — E538 Deficiency of other specified B group vitamins: Secondary | ICD-10-CM

## 2022-05-02 DIAGNOSIS — R0683 Snoring: Secondary | ICD-10-CM

## 2022-05-02 DIAGNOSIS — G4733 Obstructive sleep apnea (adult) (pediatric): Secondary | ICD-10-CM | POA: Diagnosis not present

## 2022-05-02 MED ORDER — CYANOCOBALAMIN 1000 MCG/ML IJ SOLN
1000.0000 ug | Freq: Once | INTRAMUSCULAR | Status: AC
  Administered 2022-05-02: 1000 ug via INTRAMUSCULAR

## 2022-05-03 ENCOUNTER — Telehealth: Payer: Self-pay | Admitting: Pulmonary Disease

## 2022-05-03 DIAGNOSIS — G4733 Obstructive sleep apnea (adult) (pediatric): Secondary | ICD-10-CM | POA: Diagnosis not present

## 2022-05-03 NOTE — Telephone Encounter (Signed)
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  Please arrange for ROV with me or NP to discuss treatment options.

## 2022-05-05 NOTE — Telephone Encounter (Signed)
ATC patient.  LMTCB. 

## 2022-05-05 NOTE — Telephone Encounter (Signed)
05/05/22 - Pt returning call.  [phone].

## 2022-05-10 ENCOUNTER — Other Ambulatory Visit: Payer: Self-pay | Admitting: Physical Medicine & Rehabilitation

## 2022-05-10 DIAGNOSIS — G8929 Other chronic pain: Secondary | ICD-10-CM

## 2022-05-10 NOTE — Telephone Encounter (Signed)
Spoke with patient regarding HST results. They verbalized understanding. Appt scheduled for 1/8 with TP in [place] office No further questions.  Nothing further needed at this time.

## 2022-05-12 ENCOUNTER — Other Ambulatory Visit: Payer: Self-pay | Admitting: Unknown Physician Specialty

## 2022-05-12 DIAGNOSIS — Z8673 Personal history of transient ischemic attack (TIA), and cerebral infarction without residual deficits: Secondary | ICD-10-CM

## 2022-05-15 NOTE — Telephone Encounter (Signed)
Requested Prescriptions  Pending Prescriptions Disp Refills   atorvastatin (LIPITOR) 10 MG tablet [Pharmacy Med Name: Atorvastatin Calcium 10 MG Oral Tablet] 29 tablet 0    Sig: TAKE 1 TABLET BY MOUTH AT  BEDTIME 2 NIGHTS PER WEEK     Cardiovascular:  Antilipid - Statins Failed - 05/12/2022 10:56 PM      Failed - Lipid Panel in normal range within the last 12 months    Cholesterol  Date Value Ref Range Status  03/20/2022 172 <200 mg/dL Final   LDL Cholesterol (Calc)  Date Value Ref Range Status  03/20/2022 86 mg/dL (calc) Final    Comment:    Reference range: <100 . Desirable range <100 mg/dL for primary prevention;   <70 mg/dL for patients with CHD or diabetic patients  with > or = 2 CHD risk factors. Marland Kitchen LDL-C is now calculated using the Martin-Hopkins  calculation, which is a validated novel method providing  better accuracy than the Friedewald equation in the  estimation of LDL-C.  Cresenciano Genre et al. Annamaria Helling. 4696;295(28): 2061-2068  (http://education.[facility].com/faq/FAQ164)    HDL  Date Value Ref Range Status  03/20/2022 65 > OR = 50 mg/dL Final   Triglycerides  Date Value Ref Range Status  03/20/2022 118 <150 mg/dL Final         Passed - Patient is not pregnant      Passed - Valid encounter within last 12 months    Recent Outpatient Visits           1 month ago Annual physical exam   [facility] Bo Merino, FNP   3 months ago Other fatigue   [place], FNP   4 months ago Other fatigue   [facility] Steele Sizer, MD   6 months ago Acute pain of right knee   [facility] Delsa Grana, PA-C   1 year ago Annual physical exam   [facility] Bo Merino, FNP       Future Appointments             In 4 months Reece Packer, Myna Hidalgo, [facility], [place]   In 10 months Reece Packer, Myna Hidalgo, [place], [place]   In 11 months  [facility], [facility]

## 2022-05-17 ENCOUNTER — Ambulatory Visit
Admission: RE | Admit: 2022-05-17 | Discharge: 2022-05-17 | Disposition: A | Discharge status: Home / Self Care | Payer: Medicare Other | Source: Ambulatory Visit | Attending: Physical Medicine & Rehabilitation | Admitting: Physical Medicine & Rehabilitation

## 2022-05-17 DIAGNOSIS — G8929 Other chronic pain: Secondary | ICD-10-CM

## 2022-05-17 DIAGNOSIS — M546 Pain in thoracic spine: Secondary | ICD-10-CM | POA: Insufficient documentation

## 2022-05-17 DIAGNOSIS — M5441 Lumbago with sciatica, right side: Secondary | ICD-10-CM | POA: Diagnosis present

## 2022-05-17 DIAGNOSIS — M5442 Lumbago with sciatica, left side: Secondary | ICD-10-CM | POA: Diagnosis not present

## 2022-06-12 ENCOUNTER — Telehealth (INDEPENDENT_AMBULATORY_CARE_PROVIDER_SITE_OTHER): Payer: Medicare Other | Admitting: Primary Care

## 2022-06-12 DIAGNOSIS — G4730 Sleep apnea, unspecified: Secondary | ICD-10-CM

## 2022-06-12 NOTE — Progress Notes (Signed)
Virtual Visit via Video Note  I connected with Patricia Richardson on 06/12/22 at  2:00 PM EST by a video enabled telemedicine application and verified that I am speaking with the correct person using two identifiers.  Location: Patient: Home Provider: Office    I discussed the limitations of evaluation and management by telemedicine and the availability of in person appointments. The patient expressed understanding and agreed to proceed.  History of Present Illness: 72 year old, never smoked. PMH significant HTN, GERD, thyroid nodule, parkinsonism, depression, cystocele, depression, hyperlipidemia, prediabetes, polycythemia.    Previous [facility] pulmonary encounter: 03/17/22- Dr. Halford Chessman  She has noticed trouble feeling sleep during the day.  This has been going on for a while.  She falls asleep if she is sitting quiet.  She snores some, and wakes up frequently during dreams.  She wakes up with a dry mouth.  She goes to sleep at 10 pm.  She falls asleep quickly.  She wakes up 3 or 4 times to use the bathroom.  She gets out of bed at 7 am.  She feels tired in the morning.  She denies morning headache.  She does not use anything to help her stay awake.  She tried melatonin gummies.  These help her fall asleep, but not stay asleep  She denies sleep walking, sleep talking, bruxism, or nightmares.  There is no history of restless legs.  She denies sleep hallucinations, sleep paralysis, or cataplexy.  The Epworth score is 13 out of 24.   06/12/2022 - Interim hx  Patient contacted today for virtual visit, unable to connect to video potion. We spoke by telephone. She was seen in October 2023 for sleep consult. She has symptoms of daytime sleepiness and disrupted sleep.  She is unsure if she snores.  Home sleep study on 05/02/22 showed mild obstructive sleep apnea with an AHI of 5.7/hour.  We reviewed sleep study results and risks of untreated sleep apnea.  At this time she is not interested in oral appliance or  CPAP.    Observations/Objective:  - Unable to to connect to video   Assessment and Plan:  Mild OSA: - HST 05/02/22 >> AHI 5.7/hour with SpO2 low 82% (basal 93%). Reviewed sleep study results,  at this time she is not interested in oral appliance or CPAP.  She has no significant cardiac history except for hypertension. We discussed watchful waiting and conservative management with weight loss and side sleeping position.  She will notify office if sleep symptoms worsen.  Follow Up Instructions:  As needed if sleep symptoms worsen    I discussed the assessment and treatment plan with the patient. The patient was provided an opportunity to ask questions and all were answered. The patient agreed with the plan and demonstrated an understanding of the instructions.   The patient was advised to call back or seek an in-person evaluation if the symptoms worsen or if the condition fails to improve as anticipated.  I provided 20 minutes of non-face-to-face time during this encounter.   Martyn Ehrich, NP

## 2022-06-13 NOTE — Progress Notes (Signed)
Reviewed and agree with assessment/plan.   Chesley Mires, MD [facility] Pulmonary/Critical Care 06/13/2022, 8:20 AM Pager:  [phone]

## 2022-06-14 ENCOUNTER — Ambulatory Visit (INDEPENDENT_AMBULATORY_CARE_PROVIDER_SITE_OTHER): Payer: Medicare Other

## 2022-06-14 DIAGNOSIS — E538 Deficiency of other specified B group vitamins: Secondary | ICD-10-CM

## 2022-06-14 MED ORDER — CYANOCOBALAMIN 1000 MCG/ML IJ SOLN
1000.0000 ug | Freq: Once | INTRAMUSCULAR | Status: AC
  Administered 2022-06-14: 1000 ug via INTRAMUSCULAR

## 2022-06-15 DIAGNOSIS — M48062 Spinal stenosis, lumbar region with neurogenic claudication: Secondary | ICD-10-CM | POA: Diagnosis not present

## 2022-06-27 DIAGNOSIS — Z961 Presence of intraocular lens: Secondary | ICD-10-CM | POA: Diagnosis not present

## 2022-06-28 DIAGNOSIS — M546 Pain in thoracic spine: Secondary | ICD-10-CM | POA: Diagnosis not present

## 2022-06-28 DIAGNOSIS — M5442 Lumbago with sciatica, left side: Secondary | ICD-10-CM | POA: Diagnosis not present

## 2022-06-28 DIAGNOSIS — M48062 Spinal stenosis, lumbar region with neurogenic claudication: Secondary | ICD-10-CM | POA: Diagnosis not present

## 2022-06-28 DIAGNOSIS — G8929 Other chronic pain: Secondary | ICD-10-CM | POA: Diagnosis not present

## 2022-06-28 DIAGNOSIS — M5441 Lumbago with sciatica, right side: Secondary | ICD-10-CM | POA: Diagnosis not present

## 2022-07-09 ENCOUNTER — Other Ambulatory Visit: Payer: Self-pay | Admitting: Nurse Practitioner

## 2022-07-09 DIAGNOSIS — I10 Essential (primary) hypertension: Secondary | ICD-10-CM

## 2022-07-09 DIAGNOSIS — R609 Edema, unspecified: Secondary | ICD-10-CM

## 2022-07-10 NOTE — Telephone Encounter (Signed)
May give #100 in place of #90 on RF Requested Prescriptions  Pending Prescriptions Disp Refills   hydrochlorothiazide (HYDRODIURIL) 25 MG tablet [Pharmacy Med Name: hydroCHLOROthiazide 25 MG Oral Tablet] 100 tablet 0    Sig: TAKE 1 TABLET BY MOUTH DAILY     Cardiovascular: Diuretics - Thiazide Passed - 07/09/2022 12:48 AM      Passed - Cr in normal range and within 180 days    Creat  Date Value Ref Range Status  03/20/2022 0.78 0.60 - 1.00 mg/dL Final         Passed - K in normal range and within 180 days    Potassium  Date Value Ref Range Status  03/20/2022 4.7 3.5 - 5.3 mmol/L Final         Passed - Na in normal range and within 180 days    Sodium  Date Value Ref Range Status  03/20/2022 143 135 - 146 mmol/L Final         Passed - Last BP in normal range    BP Readings from Last 1 Encounters:  04/21/22 102/62         Passed - Valid encounter within last 6 months    Recent Outpatient Visits           3 months ago Annual physical exam   [facility] Bo Merino, FNP   4 months ago Other fatigue   [facility] Bo Merino, FNP   6 months ago Other fatigue   [facility] Steele Sizer, MD   8 months ago Acute pain of right knee   [facility] Delsa Grana, PA-C   1 year ago Annual physical exam   [facility] Bo Merino, FNP       Future Appointments             In 2 months Reece Packer, Myna Hidalgo, [facility], [place]   In 8 months Reece Packer, Myna Hidalgo, [facility], [place]   In 9 months  [facility], [facility]

## 2022-07-12 ENCOUNTER — Ambulatory Visit: Payer: Medicare Other

## 2022-07-14 DIAGNOSIS — M48062 Spinal stenosis, lumbar region with neurogenic claudication: Secondary | ICD-10-CM | POA: Diagnosis not present

## 2022-07-18 ENCOUNTER — Ambulatory Visit (INDEPENDENT_AMBULATORY_CARE_PROVIDER_SITE_OTHER): Payer: Medicare Other

## 2022-07-18 DIAGNOSIS — G8929 Other chronic pain: Secondary | ICD-10-CM | POA: Diagnosis not present

## 2022-07-18 DIAGNOSIS — R262 Difficulty in walking, not elsewhere classified: Secondary | ICD-10-CM | POA: Diagnosis not present

## 2022-07-18 DIAGNOSIS — G20C Parkinsonism, unspecified: Secondary | ICD-10-CM | POA: Diagnosis not present

## 2022-07-18 DIAGNOSIS — R413 Other amnesia: Secondary | ICD-10-CM | POA: Diagnosis not present

## 2022-07-18 DIAGNOSIS — R251 Tremor, unspecified: Secondary | ICD-10-CM | POA: Diagnosis not present

## 2022-07-18 DIAGNOSIS — R498 Other voice and resonance disorders: Secondary | ICD-10-CM | POA: Diagnosis not present

## 2022-07-18 DIAGNOSIS — M545 Low back pain, unspecified: Secondary | ICD-10-CM | POA: Diagnosis not present

## 2022-07-18 DIAGNOSIS — E538 Deficiency of other specified B group vitamins: Secondary | ICD-10-CM

## 2022-07-18 MED ORDER — CYANOCOBALAMIN 1000 MCG/ML IJ SOLN
1000.0000 ug | Freq: Once | INTRAMUSCULAR | Status: AC
  Administered 2022-07-18: 1000 ug via INTRAMUSCULAR

## 2022-07-18 NOTE — Progress Notes (Signed)
Patient presented for B-12 injection. Patient verified she was in good health and had no prior reactions to this type of injection. Patient tolerated injection well and had no adverse reaction noted prior to clinic departure.

## 2022-07-21 ENCOUNTER — Other Ambulatory Visit: Payer: Self-pay | Admitting: Nurse Practitioner

## 2022-07-21 DIAGNOSIS — Z8673 Personal history of transient ischemic attack (TIA), and cerebral infarction without residual deficits: Secondary | ICD-10-CM

## 2022-07-24 NOTE — Telephone Encounter (Signed)
Requested Prescriptions  Pending Prescriptions Disp Refills   atorvastatin (LIPITOR) 10 MG tablet [Pharmacy Med Name: Atorvastatin Calcium 10 MG Oral Tablet] 90 tablet 0    Sig: TAKE 1 TABLET BY MOUTH AT  BEDTIME 2 NIGHTS WEEKLY     Cardiovascular:  Antilipid - Statins Failed - 07/21/2022 10:56 PM      Failed - Lipid Panel in normal range within the last 12 months    Cholesterol  Date Value Ref Range Status  03/20/2022 172 <200 mg/dL Final   LDL Cholesterol (Calc)  Date Value Ref Range Status  03/20/2022 86 mg/dL (calc) Final    Comment:    Reference range: <100 . Desirable range <100 mg/dL for primary prevention;   <70 mg/dL for patients with CHD or diabetic patients  with > or = 2 CHD risk factors. Marland Kitchen LDL-C is now calculated using the Martin-Hopkins  calculation, which is a validated novel method providing  better accuracy than the Friedewald equation in the  estimation of LDL-C.  Cresenciano Genre et al. Annamaria Helling. [id number]): 2061-2068  (http://education.[facility].com/faq/FAQ164)    HDL  Date Value Ref Range Status  03/20/2022 65 > OR = 50 mg/dL Final   Triglycerides  Date Value Ref Range Status  03/20/2022 118 <150 mg/dL Final         Passed - Patient is not pregnant      Passed - Valid encounter within last 12 months    Recent Outpatient Visits           4 months ago Annual physical exam   [facility] Bo Merino, FNP   5 months ago Other fatigue   [facility] Bo Merino, FNP   7 months ago Other fatigue   [facility] Steele Sizer, MD   9 months ago Acute pain of right knee   [facility] Delsa Grana, PA-C   1 year ago Annual physical exam   [facility] Bo Merino, FNP       Future Appointments             In 1 month Reece Packer, Myna Hidalgo, [facility], [place]   In 8 months Reece Packer,  Myna Hidalgo, [facility], [place]   In 8 months  [facility], [facility]

## 2022-08-01 DIAGNOSIS — M5441 Lumbago with sciatica, right side: Secondary | ICD-10-CM | POA: Diagnosis not present

## 2022-08-01 DIAGNOSIS — G8929 Other chronic pain: Secondary | ICD-10-CM | POA: Diagnosis not present

## 2022-08-01 DIAGNOSIS — M48062 Spinal stenosis, lumbar region with neurogenic claudication: Secondary | ICD-10-CM | POA: Diagnosis not present

## 2022-08-01 DIAGNOSIS — M5442 Lumbago with sciatica, left side: Secondary | ICD-10-CM | POA: Diagnosis not present

## 2022-08-09 ENCOUNTER — Other Ambulatory Visit: Payer: Self-pay | Admitting: Nurse Practitioner

## 2022-08-09 DIAGNOSIS — R609 Edema, unspecified: Secondary | ICD-10-CM

## 2022-08-09 DIAGNOSIS — I10 Essential (primary) hypertension: Secondary | ICD-10-CM

## 2022-08-09 NOTE — Telephone Encounter (Signed)
[facility] Rx 07/10/22 #100- too soon Requested Prescriptions  Pending Prescriptions Disp Refills   amLODipine (NORVASC) 10 MG tablet [Pharmacy Med Name: amLODIPine Besylate 10 MG Oral Tablet] 100 tablet 0    Sig: TAKE 1 TABLET BY MOUTH DAILY     Cardiovascular: Calcium Channel Blockers 2 Passed - 08/09/2022  8:09 AM      Passed - Last BP in normal range    BP Readings from Last 1 Encounters:  04/21/22 102/62         Passed - Last Heart Rate in normal range    Pulse Readings from Last 1 Encounters:  04/21/22 82         Passed - Valid encounter within last 6 months    Recent Outpatient Visits           4 months ago Annual physical exam   [facility] Bo Merino, FNP   5 months ago Other fatigue   [facility] Bo Merino, FNP   7 months ago Other fatigue   [facility] Steele Sizer, MD   9 months ago Acute pain of right knee   [facility] Delsa Grana, PA-C   1 year ago Annual physical exam   [facility] Bo Merino, FNP       Future Appointments             In 1 month Reece Packer, Myna Hidalgo, [facility], [place]   In 7 months Reece Packer, Myna Hidalgo, [facility], [place]   In 8 months  [facility], [facility]            Refused Prescriptions Disp Refills   hydrochlorothiazide (HYDRODIURIL) 25 MG tablet [Pharmacy Med Name: hydroCHLOROthiazide 25 MG Oral Tablet] 100 tablet 2    Sig: TAKE 1 TABLET BY MOUTH DAILY     Cardiovascular: Diuretics - Thiazide Passed - 08/09/2022  8:09 AM      Passed - Cr in normal range and within 180 days    Creat  Date Value Ref Range Status  03/20/2022 0.78 0.60 - 1.00 mg/dL Final         Passed - K in normal range and within 180 days    Potassium  Date Value Ref Range Status  03/20/2022 4.7 3.5 - 5.3 mmol/L Final         Passed - Na  in normal range and within 180 days    Sodium  Date Value Ref Range Status  03/20/2022 143 135 - 146 mmol/L Final         Passed - Last BP in normal range    BP Readings from Last 1 Encounters:  04/21/22 102/62         Passed - Valid encounter within last 6 months    Recent Outpatient Visits           4 months ago Annual physical exam   [place], FNP   5 months ago Other fatigue   [place], FNP   7 months ago Other fatigue   [facility] Steele Sizer, MD   9 months ago Acute pain of right knee   [facility] Delsa Grana, PA-C   1 year ago Annual physical exam   [facility] Bo Merino, FNP       Future Appointments             In 1 month Reece Packer, Myna Hidalgo, [facility], [place]   In 7 months Reece Packer, Myna Hidalgo, [facility], [place]   In 8 months  [facility], [facility]

## 2022-08-15 ENCOUNTER — Ambulatory Visit: Payer: Medicare Other

## 2022-08-17 ENCOUNTER — Ambulatory Visit (INDEPENDENT_AMBULATORY_CARE_PROVIDER_SITE_OTHER): Payer: Medicare Other

## 2022-08-17 DIAGNOSIS — E538 Deficiency of other specified B group vitamins: Secondary | ICD-10-CM | POA: Diagnosis not present

## 2022-08-17 MED ORDER — CYANOCOBALAMIN 1000 MCG/ML IJ SOLN
1000.0000 ug | Freq: Once | INTRAMUSCULAR | Status: AC
  Administered 2022-08-17: 1000 ug via INTRAMUSCULAR

## 2022-09-05 DIAGNOSIS — E0590 Thyrotoxicosis, unspecified without thyrotoxic crisis or storm: Secondary | ICD-10-CM | POA: Diagnosis not present

## 2022-09-12 DIAGNOSIS — E0590 Thyrotoxicosis, unspecified without thyrotoxic crisis or storm: Secondary | ICD-10-CM | POA: Diagnosis not present

## 2022-09-12 DIAGNOSIS — E042 Nontoxic multinodular goiter: Secondary | ICD-10-CM | POA: Diagnosis not present

## 2022-09-16 ENCOUNTER — Other Ambulatory Visit: Payer: Self-pay | Admitting: Nurse Practitioner

## 2022-09-16 DIAGNOSIS — I10 Essential (primary) hypertension: Secondary | ICD-10-CM

## 2022-09-16 DIAGNOSIS — R609 Edema, unspecified: Secondary | ICD-10-CM

## 2022-09-18 NOTE — Telephone Encounter (Signed)
Unable to refill per protocol, Rx request is too soon. Last refill 07/10/22 for 100 days.  Requested Prescriptions  Pending Prescriptions Disp Refills   hydrochlorothiazide (HYDRODIURIL) 25 MG tablet [Pharmacy Med Name: hydroCHLOROthiazide 25 MG Oral Tablet] 100 tablet 2    Sig: TAKE 1 TABLET BY MOUTH DAILY     Cardiovascular: Diuretics - Thiazide Passed - 09/16/2022 10:26 PM      Passed - Cr in normal range and within 180 days    Creat  Date Value Ref Range Status  03/20/2022 0.78 0.60 - 1.00 mg/dL Final         Passed - K in normal range and within 180 days    Potassium  Date Value Ref Range Status  03/20/2022 4.7 3.5 - 5.3 mmol/L Final         Passed - Na in normal range and within 180 days    Sodium  Date Value Ref Range Status  03/20/2022 143 135 - 146 mmol/L Final         Passed - Last BP in normal range    BP Readings from Last 1 Encounters:  04/21/22 102/62         Passed - Valid encounter within last 6 months    Recent Outpatient Visits           6 months ago Annual physical exam   [facility] Berniece Salines, FNP   7 months ago Other fatigue   [facility] Berniece Salines, FNP   8 months ago Other fatigue   [facility] Alba Cory, MD   10 months ago Acute pain of right knee   [facility] Danelle Berry, PA-C   1 year ago Annual physical exam   [facility] Berniece Salines, FNP       Future Appointments             Tomorrow Zane Herald, Rudolpho Sevin, FNP  [facility], [facility]   In 6 months Zane Herald, Rudolpho Sevin, FNP [facility], [facility]   In 6 months  [facility], [facility]

## 2022-09-18 NOTE — Progress Notes (Unsigned)
There were no vitals taken for this visit.   Subjective:    Patient ID: Patricia Richardson, female    DOB: August 19, 1950, 72 y.o.   MRN: [id number]  HPI: Patricia Richardson is a 72 y.o. female  No chief complaint on file.  B12 deficiency:  her last b 12 level was 301 on 12/26/2021. She is currently getting B12 injections.    HTN:  her blood pressure today is ***. She is currently taking hydrochlorothiazide 25 mg daily, amlodipine 10 mg daily.  Denies any chest pain, shortness of breath, headaches or blurred vision.  Hyperlipidemia/HX of stroke: Her last LDL was 86 on 03/20/2022.  Currently taking atorvastatin 10 mg two times a week.  GERD: Currently taking omeprazole 20 mg every other day.  Chronic low back pain/tremors/parkinsonism: Established with neurology.  Last saw neurology on 07/18/2022. Note from visit: - Symptoms continue to be monitored for possible Parkinson's Disease. Completed gait assessment today. Patient has decreased left arm swing, and mild tremor in the left hand.  - Increase Gabapentin to 300 mg four times daily, until she completes this dosage. Then increase to 400 mg three times daily. - Continue taking Sinemet 25-100 mg one tablet four times daily, refilled.  - Continue taking Meloxicam 15 mg in the morning WITH FOOD for low back pain, refilled 90-day supply.    Relevant past medical, surgical, family and social history reviewed and updated as indicated. Interim medical history since our last visit reviewed. Allergies and medications reviewed and updated.  Review of Systems  Constitutional: Negative for fever or weight change.  Respiratory: Negative for cough and shortness of breath.   Cardiovascular: Negative for chest pain or palpitations.  Gastrointestinal: Negative for abdominal pain, no bowel changes.  Musculoskeletal: Negative for gait problem or joint swelling.  Skin: Negative for rash.  Neurological: Negative for dizziness or headache.  No other specific  complaints in a complete review of systems (except as listed in HPI above).      Objective:    There were no vitals taken for this visit.  Wt Readings from Last 3 Encounters:  04/21/22 177 lb (80.3 kg)  03/20/22 176 lb 6.4 oz (80 kg)  03/17/22 178 lb (80.7 kg)    Physical Exam  Constitutional: Patient appears well-developed and well-nourished. Obese *** No distress.  HEENT: head atraumatic, normocephalic, pupils equal and reactive to light, ears ***, neck supple, throat within normal limits Cardiovascular: Normal rate, regular rhythm and normal heart sounds.  No murmur heard. No BLE edema. Pulmonary/Chest: Effort normal and breath sounds normal. No respiratory distress. Abdominal: Soft.  There is no tenderness. Psychiatric: Patient has a normal mood and affect. behavior is normal. Judgment and thought content normal.  Results for orders placed or performed during the hospital encounter of 04/21/22  Surgical pathology  Result Value Ref Range   SURGICAL PATHOLOGY      SURGICAL PATHOLOGY CASE: [id number] PATIENT: Patricia Richardson Surgical Pathology Report     Specimen Submitted: A. Stomach, random; cbx B. Esophagus; cbx C. Colon polyp x3, tran; c snare(2) cbx(1) D. Colon polyp x3, cecum; cbx(2) c snare(1) E. Colon polyp x2, descending; cold snare F. Rectum polyp; cbx  Clinical History: Dysphagia, PH colon polyps.  Diverticulosis, colon polyps, internal hemorrhoids      DIAGNOSIS: A.  STOMACH, RANDOM; COLD BIOPSY: - ANTRAL MUCOSA WITH REACTIVE GASTRITIS. - OXYNTIC MUCOSA WITH CHANGES CONSISTENT WITH PROTON PUMP INHIBITOR EFFECT. - NEGATIVE FOR H. PYLORI, INTESTINAL METAPLASIA, DYSPLASIA, AND MALIGNANCY.  B.  ESOPHAGUS; COLD BIOPSY: - SQUAMOUS MUCOSA WITH MILD SPONGIOSIS AND CHANGES COMPATIBLE WITH REFLUX. - NEGATIVE FOR INTRAEPITHELIAL EOSINOPHILS, DYSPLASIA, AND MALIGNANCY.  C.  COLON POLYP X 3, TRANSVERSE; COLD SNARE (2) AND COLD BIOPSY (1). - TUBULAR ADENOMA  (MULTIPLE FRAGMENTS). - NEGATIVE FOR HIGH-GRADE DYSPLASI A AND MALIGNANCY.  D.  COLON POLYP X 3, CECUM; COLD BIOPSY (2) AND COLD SNARE (1). - TUBULAR ADENOMA (MULTIPLE FRAGMENTS). - SESSILE SERRATED POLYP (MULTIPLE FRAGMENTS). - NEGATIVE FOR HIGH-GRADE DYSPLASIA AND MALIGNANCY.  E.  COLON POLYP X 2, DESCENDING; COLD SNARE: - TUBULAR ADENOMA (1). - HYPERPLASTIC POLYP (1). - NEGATIVE FOR HIGH-GRADE DYSPLASIA AND MALIGNANCY.  F.  RECTUM POLYP; COLD BIOPSY: - TUBULAR ADENOMA. - NEGATIVE FOR HIGH-GRADE DYSPLASIA AND MALIGNANCY. - SEPARATE FRAGMENT OF GASTRIC ANTRAL-TYPE MUCOSA FAVOR CARRYOVER FROM PART A.  GROSS DESCRIPTION: A. Labeled: cbx random gastric for H. pylori Received: Formalin Collection time: 9:21 AM on 04/21/2022 Placed into formalin time: 9:21 AM on 04/21/2022 Tissue fragment(s): 4 Size: Aggregate, 0.7 x 0.6 x 0.2 cm Description: Tan soft tissue fragments Entirely submitted in 1 cassette.  B. Labeled: cbx esophagus rule out EOE Received: Formalin Collection time: 9:23 AM on 04/21/2022 Placed into forma lin time: 9:23 AM on 04/21/2022 Tissue fragment(s): 3 Size: Aggregate, 0.8 x 0.5 x 0.1 cm Description: White-pink translucent soft tissue fragments Entirely submitted in 1 cassette.  C. Labeled: Cold snare polyp x 2/cbx polyp x 1 transverse colon Received: Formalin Collection time: 9:44 AM on 04/21/2022 Placed into formalin time: 9:44 AM on 04/21/2022 Tissue fragment(s): Multiple Size: Aggregate, 1.5 x 0.5 x 0.1 cm Description: Tan soft tissue fragments Entirely submitted in 1 cassette.  D. Labeled: cbx polyp x 2/cold snare polyp x 1 cecum Received: Formalin Collection time: 9:44 AM on 04/21/2022 Placed into formalin time: 9:44 AM on 04/21/2022 Tissue fragment(s): Multiple Size: Aggregate, 1.5 x 0.6 x 0.1 cm Description: Received are fragments of tan-pink soft tissue admixed with intestinal debris.  The ratio of soft tissue to intestinal debris is  90: 10. Entirely submitted in 1 cassette.  E. Labeled: Cold snare polyp x 2 descending colon Received: Formalin  Collection time: 10 AM on 04/21/2022 Placed into formalin time: 10 AM on 04/21/2022 Tissue fragment(s): 3 Size: Aggregate, 0.7 x 0.6 x 0.1 cm Description: Tan soft tissue fragments Entirely submitted in 1 cassette.  F. Labeled: cbx polyp rectum Received: Formalin Collection time: 10:09 AM on 04/21/2022 Placed into formalin time: 10:09 AM on 04/21/2022 Tissue fragment(s): Multiple Size: Aggregate, 1.9 x 0.7 x 0.2 cm Description: Received are at least 2 fragments of tan-pink soft tissue admixed with intestinal debris.  The ratio of soft tissue to intestinal debris is 60: 40. Entirely submitted in 1 cassette.  RB 04/21/2022  Final Diagnosis performed by Elijah Birk, MD.   Electronically signed 04/24/2022 1:51:45PM The electronic signature indicates that the named Attending Pathologist has evaluated the specimen Technical component performed at [place], [address], [place], [place] [address] Lab: [phone] Dir: Jolene Schimke, MD, MMM  Professional component p erformed at [facility], [facility], [address] [place], [place], [place] [address] Lab: [phone] Dir: Beryle Quant, MD       Assessment & Plan:   Problem List Items Addressed This Visit   None    Follow up plan: No follow-ups on file.

## 2022-09-19 ENCOUNTER — Ambulatory Visit (INDEPENDENT_AMBULATORY_CARE_PROVIDER_SITE_OTHER): Payer: Medicare Other | Admitting: Nurse Practitioner

## 2022-09-19 ENCOUNTER — Encounter: Payer: Self-pay | Admitting: Nurse Practitioner

## 2022-09-19 VITALS — BP 126/64 | HR 97 | Temp 98.0°F | Resp 16 | Ht 62.0 in | Wt 184.7 lb

## 2022-09-19 DIAGNOSIS — Z131 Encounter for screening for diabetes mellitus: Secondary | ICD-10-CM

## 2022-09-19 DIAGNOSIS — E782 Mixed hyperlipidemia: Secondary | ICD-10-CM

## 2022-09-19 DIAGNOSIS — I10 Essential (primary) hypertension: Secondary | ICD-10-CM | POA: Diagnosis not present

## 2022-09-19 DIAGNOSIS — B070 Plantar wart: Secondary | ICD-10-CM | POA: Diagnosis not present

## 2022-09-19 DIAGNOSIS — R251 Tremor, unspecified: Secondary | ICD-10-CM

## 2022-09-19 DIAGNOSIS — D692 Other nonthrombocytopenic purpura: Secondary | ICD-10-CM

## 2022-09-19 DIAGNOSIS — E538 Deficiency of other specified B group vitamins: Secondary | ICD-10-CM | POA: Diagnosis not present

## 2022-09-19 DIAGNOSIS — M79671 Pain in right foot: Secondary | ICD-10-CM | POA: Diagnosis not present

## 2022-09-19 DIAGNOSIS — Z8673 Personal history of transient ischemic attack (TIA), and cerebral infarction without residual deficits: Secondary | ICD-10-CM

## 2022-09-19 DIAGNOSIS — K219 Gastro-esophageal reflux disease without esophagitis: Secondary | ICD-10-CM

## 2022-09-19 DIAGNOSIS — I739 Peripheral vascular disease, unspecified: Secondary | ICD-10-CM

## 2022-09-19 DIAGNOSIS — R609 Edema, unspecified: Secondary | ICD-10-CM

## 2022-09-19 DIAGNOSIS — G20C Parkinsonism, unspecified: Secondary | ICD-10-CM

## 2022-09-19 DIAGNOSIS — D2371 Other benign neoplasm of skin of right lower limb, including hip: Secondary | ICD-10-CM | POA: Diagnosis not present

## 2022-09-19 LAB — CBC WITH DIFFERENTIAL/PLATELET
Absolute Monocytes: 464 cells/uL (ref 200–950)
HCT: 42.6 % (ref 35.0–45.0)
Hemoglobin: 14.3 g/dL (ref 11.7–15.5)
MCH: 30 pg (ref 27.0–33.0)
MCHC: 33.6 g/dL (ref 32.0–36.0)
MCV: 89.3 fL (ref 80.0–100.0)
RBC: 4.77 10*6/uL (ref 3.80–5.10)
RDW: 12.7 % (ref 11.0–15.0)
Total Lymphocyte: 17.4 %

## 2022-09-19 MED ORDER — HYDROCHLOROTHIAZIDE 25 MG PO TABS: 25.0000 mg | ORAL_TABLET | Freq: Every day | ORAL | 3 refills | Status: DC

## 2022-09-19 MED ORDER — CYANOCOBALAMIN 1000 MCG/ML IJ SOLN
1000.0000 ug | Freq: Once | INTRAMUSCULAR | Status: AC
Start: 2022-09-19 — End: 2022-09-19
  Administered 2022-09-19: 1000 ug via INTRAMUSCULAR

## 2022-09-19 MED ORDER — ATORVASTATIN CALCIUM 10 MG PO TABS
ORAL_TABLET | ORAL | 0 refills | Status: DC
Start: 2022-09-19 — End: 2023-06-21

## 2022-09-19 MED ORDER — AMLODIPINE BESYLATE 10 MG PO TABS: 10.0000 mg | ORAL_TABLET | Freq: Every day | ORAL | 3 refills | Status: DC

## 2022-09-19 NOTE — Assessment & Plan Note (Signed)
Currently taking hydrochlorothiazide 25 mg daily and amlodipine 10 mg daily.

## 2022-09-19 NOTE — Assessment & Plan Note (Signed)
Continue taking atorvastatin 10 mg 2 times a week.

## 2022-09-19 NOTE — Assessment & Plan Note (Signed)
Followed by neurology increase Gabapentin to 300 mg four times daily, until she completes this dosage. Then increase to 400 mg three times daily. - Continue taking Sinemet 25-100 mg one tablet four times daily, refilled.  - Continue taking Meloxicam 15 mg in the morning WITH FOOD for low back pain, refilled 90-day supply.

## 2022-09-19 NOTE — Assessment & Plan Note (Signed)
Taking omeprazole 20 mg every other day.

## 2022-09-19 NOTE — Assessment & Plan Note (Signed)
Taking atorvastatin 10 mg 2 times a week.

## 2022-09-19 NOTE — Assessment & Plan Note (Signed)
Taking atorvastatin 10 mg 2 times a week. 

## 2022-09-19 NOTE — Assessment & Plan Note (Signed)
Reassurance provided to patient.

## 2022-09-19 NOTE — Assessment & Plan Note (Signed)
Vitamin B12 injections

## 2022-09-19 NOTE — Assessment & Plan Note (Signed)
Followed by neurology,  Increased Gabapentin to 300 mg four times daily, until she completes this dosage. Then increase to 400 mg three times daily. - Continue taking Sinemet 25-100 mg one tablet four times daily, refilled.  - Continue taking Meloxicam 15 mg in the morning WITH FOOD for low back pain, refilled 90-day supply.

## 2022-09-20 LAB — CBC WITH DIFFERENTIAL/PLATELET
Basophils Absolute: 43 cells/uL (ref 0–200)
Basophils Relative: 0.5 %
Eosinophils Absolute: 172 cells/uL (ref 15–500)
Eosinophils Relative: 2 %
Lymphs Abs: 1496 cells/uL (ref 850–3900)
MPV: 10.4 fL (ref 7.5–12.5)
Monocytes Relative: 5.4 %
Neutro Abs: 6424 cells/uL (ref 1500–7800)
Neutrophils Relative %: 74.7 %
Platelets: 368 10*3/uL (ref 140–400)
WBC: 8.6 10*3/uL (ref 3.8–10.8)

## 2022-09-20 LAB — COMPLETE METABOLIC PANEL WITH GFR
AG Ratio: 1.8 (calc) (ref 1.0–2.5)
ALT: 11 U/L (ref 6–29)
AST: 14 U/L (ref 10–35)
Albumin: 4.5 g/dL (ref 3.6–5.1)
Alkaline phosphatase (APISO): 71 U/L (ref 37–153)
BUN: 22 mg/dL (ref 7–25)
CO2: 22 mmol/L (ref 20–32)
Calcium: 10 mg/dL (ref 8.6–10.4)
Chloride: 106 mmol/L (ref 98–110)
Creat: 0.82 mg/dL (ref 0.60–1.00)
Globulin: 2.5 g/dL (calc) (ref 1.9–3.7)
Glucose, Bld: 123 mg/dL — ABNORMAL HIGH (ref 65–99)
Potassium: 3.9 mmol/L (ref 3.5–5.3)
Sodium: 142 mmol/L (ref 135–146)
Total Bilirubin: 0.4 mg/dL (ref 0.2–1.2)
Total Protein: 7 g/dL (ref 6.1–8.1)
eGFR: 75 mL/min/{1.73_m2} (ref >=60)

## 2022-09-20 LAB — LIPID PANEL
Cholesterol: 150 mg/dL (ref <200)
HDL: 60 mg/dL (ref >=50)
LDL Cholesterol (Calc): 69 mg/dL (calc)
Non-HDL Cholesterol (Calc): 90 mg/dL (calc) (ref <130)
Total CHOL/HDL Ratio: 2.5 (calc) (ref <5.0)
Triglycerides: 128 mg/dL (ref <150)

## 2022-09-20 LAB — HEMOGLOBIN A1C
Hgb A1c MFr Bld: 5.9 % of total Hgb — ABNORMAL HIGH (ref <5.7)
Mean Plasma Glucose: 123 mg/dL
eAG (mmol/L): 6.8 mmol/L

## 2022-11-23 ENCOUNTER — Telehealth (INDEPENDENT_AMBULATORY_CARE_PROVIDER_SITE_OTHER): Payer: Medicare Other | Admitting: Family Medicine

## 2022-11-23 ENCOUNTER — Ambulatory Visit: Payer: Self-pay

## 2022-11-23 ENCOUNTER — Encounter: Payer: Self-pay | Admitting: Family Medicine

## 2022-11-23 DIAGNOSIS — T3695XA Adverse effect of unspecified systemic antibiotic, initial encounter: Secondary | ICD-10-CM

## 2022-11-23 DIAGNOSIS — B379 Candidiasis, unspecified: Secondary | ICD-10-CM

## 2022-11-23 MED ORDER — FLUCONAZOLE 150 MG PO TABS
150.0000 mg | ORAL_TABLET | ORAL | 0 refills | Status: DC
Start: 2022-11-23 — End: 2023-08-23

## 2022-11-23 NOTE — Telephone Encounter (Signed)
Summary: possible yeast infection/requesting Rx   Pt stated she had gum surgery yesterday and on June 11th. Stated was given medication amoxicillin and other medications. Pt stated she now has a yeast infection, and they advised her to call her PCP and request medication.  Pt declined to be scheduled she just wants Raynelle Fanning to send Rx.  Seeking clinical advice      Called pt - left message on machine to return call.

## 2022-11-23 NOTE — Progress Notes (Signed)
Name: Patricia Richardson   MRN: [id number]    DOB: [birth year]   Date:11/23/2022       Progress Note  Subjective  Chief Complaint  Possible Yeast Infection  I connected with  Wilhelmina Mcardle  on 11/23/22 at 11:40 AM EDT by a video enabled telemedicine application and verified that I am speaking with the correct person using two identifiers.  I discussed the limitations of evaluation and management by telemedicine and the availability of in person appointments. The patient expressed understanding and agreed to proceed with the virtual visit  Staff also discussed with the patient that there may be a patient responsible charge related to this service. Patient Location: at home  Provider Location: [facility] Additional Individuals present: alone   HPI  Vaginitis: she had a dental procedure and is taking antibiotics - metronidazole and amoxicillin. She will finish antibiotics on Saturday. She is not sexually active. Yesterday she noticed vaginal itching and mild vaginal discharge. No fever or chills.   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   Prediabetes 06/20/2018   At risk for heart disease 06/20/2018   Essential hypertension 06/17/2018   Hx of completed stroke 06/17/2018   Obesity (BMI 35.0-39.9 without comorbidity) 06/17/2018   Status post total hip replacement, 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   Primary osteoarthritis of right hip 03/12/2017    Past Surgical History:  Procedure Laterality Date   ABDOMINAL HYSTERECTOMY     BREAST BIOPSY Right  11/03/2005   core biopsy w/ clip placement   CATARACT EXTRACTION W/PHACO Right 09/15/2020   Procedure: CATARACT EXTRACTION PHACO AND INTRAOCULAR LENS PLACEMENT (IOC) RIGHT;  Surgeon: Lockie Mola, MD;  Location: [facility];  Service: Ophthalmology;  Laterality: Right;  4.70 0:57.4 8.2%   CATARACT EXTRACTION W/PHACO Left 09/29/2020   Procedure: CATARACT EXTRACTION PHACO AND INTRAOCULAR LENS PLACEMENT (IOC) LEFT 3.01 00:45.7 6.6%;  Surgeon: Lockie Mola, MD;  Location: [facility];  Service: Ophthalmology;  Laterality: Left;   CHOLECYSTECTOMY     COLONOSCOPY     COLONOSCOPY WITH PROPOFOL N/A 11/05/2015   Procedure: COLONOSCOPY WITH PROPOFOL;  Surgeon: Christena Deem, MD;  Location: [facility] ENDOSCOPY;  Service: Endoscopy;  Laterality: N/A;   COLONOSCOPY WITH PROPOFOL N/A 04/21/2022   Procedure: COLONOSCOPY WITH PROPOFOL;  Surgeon: Jaynie Collins, DO;  Location: [facility] ENDOSCOPY;  Service: Gastroenterology;  Laterality: N/A;   ESOPHAGOGASTRODUODENOSCOPY (EGD) WITH PROPOFOL N/A 04/21/2022   Procedure: ESOPHAGOGASTRODUODENOSCOPY (EGD) WITH PROPOFOL;  Surgeon: Jaynie Collins, DO;  Location: [facility] ENDOSCOPY;  Service: Gastroenterology;  Laterality: N/A;   JOINT REPLACEMENT  04-09-2018   KNEE ARTHROSCOPY Right 02/21/2008   TOTAL HIP ARTHROPLASTY Right 04/09/2018   Procedure: TOTAL HIP ARTHROPLASTY ANTERIOR APPROACH;  Surgeon: Kennedy Bucker, MD;  Location: [facility] ORS;  Service: Orthopedics;  Laterality: Right;   WISDOM TOOTH EXTRACTION      Family History  Problem Relation Age of Onset   Breast cancer Maternal Aunt        great   Kidney disease Mother    Diabetes Mother    Hypertension Father  Kidney disease Father    Diabetes Brother    Diabetes Paternal Grandmother     Social History   Socioeconomic History   Marital status: Divorced    Spouse name: Not on file   Number of children: 2   Years of education: 12   Highest education level: 12th  grade  Occupational History   Not on file  Tobacco Use   Smoking status: Never   Smokeless tobacco: Never  Vaping Use   Vaping Use: Never used  Substance and Sexual Activity   Alcohol use: Yes    Comment: rarely   Drug use: No   Sexual activity: Not Currently  Other Topics Concern   Not on file  Social History Narrative   Pt lives alone   Social Determinants of Health   Financial Resource Strain: Low Risk  (09/16/2022)   Overall Financial Resource Strain (CARDIA)    Difficulty of Paying Living Expenses: Not hard at all  Food Insecurity: No Food Insecurity (09/16/2022)   Hunger Vital Sign    Worried About Running Out of Food in the Last Year: Never true    Ran Out of Food in the Last Year: Never true  Transportation Needs: No Transportation Needs (09/16/2022)   PRAPARE - Administrator, Civil Service (Medical): No    Lack of Transportation (Non-Medical): No  Physical Activity: Insufficiently Active (09/16/2022)   Exercise Vital Sign    Days of Exercise per Week: 3 days    Minutes of Exercise per Session: 20 min  Stress: No Stress Concern Present (09/16/2022)   [employer] of Occupational Health - Occupational Stress Questionnaire    Feeling of Stress : Only a little  Social Connections: Unknown (09/16/2022)   Social Connection and Isolation Panel [NHANES]    Frequency of Communication with Friends and Family: More than three times a week    Frequency of Social Gatherings with Friends and Family: Twice a week    Attends Religious Services: Patient declined    Database administrator or Organizations: No    Attends Banker Meetings: Never    Marital Status: Divorced  Catering manager Violence: Not At Risk (03/20/2022)   Humiliation, Afraid, Rape, and Kick questionnaire    Fear of Current or Ex-Partner: No    Emotionally Abused: No    Physically Abused: No    Sexually Abused: No     Current Outpatient Medications:    acetaminophen (TYLENOL)  500 MG tablet, Take 1-2 tablets (500-1,000 mg total) by mouth every 6 (six) hours as needed for mild pain (pain score 1-3 or temp > 100.5)., Disp: , Rfl:    amLODipine (NORVASC) 10 MG tablet, Take 1 tablet (10 mg total) by mouth daily., Disp: 90 tablet, Rfl: 3   aspirin EC 81 MG tablet, Take 1 tablet (81 mg total) by mouth daily., Disp: 90 tablet, Rfl: 3   atorvastatin (LIPITOR) 10 MG tablet, TAKE 1 TABLET BY MOUTH AT  BEDTIME 2 NIGHTS WEEKLY, Disp: 100 tablet, Rfl: 0   carbidopa-levodopa (SINEMET IR) 25-100 MG tablet, Take 1 tablet by mouth 3 (three) times daily., Disp: , Rfl:    gabapentin (NEURONTIN) 100 MG capsule, Take 100 mg by mouth 4 (four) times daily., Disp: , Rfl:    hydrochlorothiazide (HYDRODIURIL) 25 MG tablet, Take 1 tablet (25 mg total) by mouth daily., Disp: 90 tablet, Rfl: 3   meloxicam (MOBIC) 15 MG tablet, Take 15 mg by mouth daily., Disp: , Rfl:  methimazole (TAPAZOLE) 5 MG tablet, Take by mouth., Disp: , Rfl:    omeprazole (PRILOSEC) 20 MG capsule, TAKE 1 CAPSULE BY MOUTH EVERY  OTHER DAY, Disp: 50 capsule, Rfl: 2   OVER THE COUNTER MEDICATION, Take 1 tablet by mouth daily as needed (constipation). Swiss Kriss herbal laxative, Disp: , Rfl:   Allergies  Allergen Reactions   Codeine Itching    Pin and needles     I personally reviewed active problem list, medication list, allergies, family history, social history, health maintenance with the patient/caregiver today.   ROS Ten systems reviewed and is negative except as mentioned in HPI   Objective  Virtual encounter, vitals not obtained.  There is no height or weight on file to calculate BMI.  Physical Exam  Awake, alert and oriented  PHQ2/9:    11/23/2022   10:12 AM 09/19/2022   12:58 PM 04/06/2022    8:39 AM 03/20/2022    8:08 AM 02/13/2022    9:08 AM  Depression screen PHQ 2/9  Decreased Interest 0 0 0 0 0  Down, Depressed, Hopeless 0 0 0 0 0  PHQ - 2 Score 0 0 0 0 0  Altered sleeping 0 0     Tired,  decreased energy 0 0     Change in appetite 0 0     Feeling bad or failure about yourself  0 0     Trouble concentrating 0 0     Moving slowly or fidgety/restless 0 0     Suicidal thoughts 0 0     PHQ-9 Score 0 0     Difficult doing work/chores  Not difficult at all      PHQ-2/9 Result is negative.    Fall Risk:    11/23/2022   10:12 AM 09/19/2022   12:58 PM 04/06/2022    8:39 AM 03/20/2022    8:08 AM 02/13/2022    9:06 AM  Fall Risk   Falls in the past year? 0 0 0 0 0  Number falls in past yr: 0 0  0 0  Injury with Fall? 0 0  0 0  Risk for fall due to : No Fall Risks  No Fall Risks    Follow up Falls prevention discussed  Falls prevention discussed;Education provided Falls evaluation completed Falls evaluation completed     Assessment & Plan  1. Antibiotic-induced yeast infection  - fluconazole (DIFLUCAN) 150 MG tablet; Take 1 tablet (150 mg total) by mouth every other day.  Dispense: 3 tablet; Refill: 0   I discussed the assessment and treatment plan with the patient. The patient was provided an opportunity to ask questions and all were answered. The patient agreed with the plan and demonstrated an understanding of the instructions.  The patient was advised to call back or seek an in-person evaluation if the symptoms worsen or if the condition fails to improve as anticipated.  I provided 15  minutes of non-face-to-face time during this encounter.

## 2022-11-23 NOTE — Telephone Encounter (Signed)
  Chief Complaint: yeast infection- recent dental work- active antibiotic use Symptoms: itching, burning-moderate- unable to sleep Frequency: started late yesterday Pertinent Negatives: Patient denies discharge Disposition: [] ED /[] Urgent Care (no appt availability in office) / [x] Appointment(In office/virtual)/ []  [facility]/ [] Home Care/ [] Refused Recommended Disposition /[] [facility] [place] [facility]/ []  Follow-up with PCP Additional Notes: Patient is requesting medication Rx for yeast due to antibiotic use- patient is currently under treatment and has a few days left of her treatment.    Reason for Disposition  MODERATE-SEVERE itching (i.e., interferes with school, work, or sleep)  Answer Assessment - Initial Assessment Questions 1. SYMPTOM: "What's the main symptom you're concerned about?" (e.g., pain, itching, dryness)     Burning, itching- patient had dental procedures- was given antibiotic- amoxicillin, metronidazole- finishes medication Saturday 2. LOCATION: "Where is the  burning located?" (e.g., inside/outside, left/right)     Vaginal area 3. ONSET: "When did the  itching  start?"     Yesterday afternoon- symptoms started-up all night 4. PAIN: "Is there any pain?" If Yes, ask: "How bad is it?" (Scale: 1-10; mild, moderate, severe)   -  MILD (1-3): Doesn't interfere with normal activities.    -  MODERATE (4-7): Interferes with normal activities (e.g., work or school) or awakens from sleep.     -  SEVERE (8-10): Excruciating pain, unable to do any normal activities.     no 5. ITCHING: "Is there any itching?" If Yes, ask: "How bad is it?" (Scale: 1-10; mild, moderate, severe)     Yes- moderate 6. CAUSE: "What do you think is causing the discharge?" "Have you had the same problem before? What happened then?"     Antibiotic use 7. OTHER SYMPTOMS: "Do you have any other symptoms?" (e.g., fever, itching, vaginal bleeding, pain with urination, injury to genital area, vaginal  foreign body)     no  Protocols used: Vaginal Symptoms-A-AH

## 2022-11-27 DIAGNOSIS — G8929 Other chronic pain: Secondary | ICD-10-CM | POA: Diagnosis not present

## 2022-11-27 DIAGNOSIS — R413 Other amnesia: Secondary | ICD-10-CM | POA: Diagnosis not present

## 2022-11-27 DIAGNOSIS — R498 Other voice and resonance disorders: Secondary | ICD-10-CM | POA: Diagnosis not present

## 2022-11-27 DIAGNOSIS — G20C Parkinsonism, unspecified: Secondary | ICD-10-CM | POA: Diagnosis not present

## 2022-11-27 DIAGNOSIS — M545 Low back pain, unspecified: Secondary | ICD-10-CM | POA: Diagnosis not present

## 2022-11-27 DIAGNOSIS — R262 Difficulty in walking, not elsewhere classified: Secondary | ICD-10-CM | POA: Diagnosis not present

## 2023-01-03 DIAGNOSIS — H40003 Preglaucoma, unspecified, bilateral: Secondary | ICD-10-CM | POA: Diagnosis not present

## 2023-01-08 ENCOUNTER — Ambulatory Visit: Payer: Self-pay | Admitting: *Deleted

## 2023-01-08 NOTE — Telephone Encounter (Signed)
Pt schedueled appt with lesia for Tuesday 01/08/23

## 2023-01-08 NOTE — Telephone Encounter (Signed)
  Chief Complaint: SOB, cough, fever, congestion, fatigue, muscle pain Symptoms: see above Frequency: over 1 week Pertinent Negatives: Patient denies recent exposure to illness Disposition: [x] ED /[] Urgent Care (no appt availability in office) / [] Appointment(In office/virtual)/ []  [facility]/ [] Home Care/ [] Refused Recommended Disposition /[] [place] [facility]/ []  Follow-up with PCP Additional Notes: Due to symptoms- advised ED- patient declines -she wants appointment- advised needs ED and that is what provider may advise as well.

## 2023-01-08 NOTE — Telephone Encounter (Signed)
Reason for Disposition  [1] MODERATE difficulty breathing (e.g., speaks in phrases, SOB even at rest, pulse 100-120) AND [2] NEW-onset or WORSE than normal  Answer Assessment - Initial Assessment Questions 1. RESPIRATORY STATUS: "Describe your breathing?" (e.g., wheezing, shortness of breath, unable to speak, severe coughing)      SOB- with exertion, weakness 2. ONSET: "When did this breathing problem begin?"      Last week 3. PATTERN "Does the difficult breathing come and go, or has it been constant since it started?"      Comes and goes 4. SEVERITY: "How bad is your breathing?" (e.g., mild, moderate, severe)    - MILD: No SOB at rest, mild SOB with walking, speaks normally in sentences, can lie down, no retractions, pulse < 100.    - MODERATE: SOB at rest, SOB with minimal exertion and prefers to sit, cannot lie down flat, speaks in phrases, mild retractions, audible wheezing, pulse 100-120.    - SEVERE: Very SOB at rest, speaks in single words, struggling to breathe, sitting hunched forward, retractions, pulse > 120      Sometimes gasping for breath, moderate 5. RECURRENT SYMPTOM: "Have you had difficulty breathing before?" If Yes, ask: "When was the last time?" and "What happened that time?"      no 6. CARDIAC HISTORY: "Do you have any history of heart disease?" (e.g., heart attack, angina, bypass surgery, angioplasty)      no 7. LUNG HISTORY: "Do you have any history of lung disease?"  (e.g., pulmonary embolus, asthma, emphysema)     no 8. CAUSE: "What do you think is causing the breathing problem?"      Unsure- respiratory infection 9. OTHER SYMPTOMS: "Do you have any other symptoms? (e.g., dizziness, runny nose, cough, chest pain, fever)     Fever, nasal/chest congestion, cough  12. TRAVEL: "Have you traveled out of the country in the last month?" (e.g., travel history, exposures)       no  Protocols used: Breathing Difficulty-A-AH

## 2023-01-09 ENCOUNTER — Encounter: Payer: Self-pay | Admitting: Family Medicine

## 2023-01-09 ENCOUNTER — Ambulatory Visit (INDEPENDENT_AMBULATORY_CARE_PROVIDER_SITE_OTHER): Payer: Medicare Other | Admitting: Family Medicine

## 2023-01-09 ENCOUNTER — Ambulatory Visit
Admission: RE | Admit: 2023-01-09 | Discharge: 2023-01-09 | Disposition: A | Discharge status: Home / Self Care | Payer: Medicare Other | Attending: Family Medicine | Admitting: Family Medicine

## 2023-01-09 ENCOUNTER — Ambulatory Visit: Admission: RE | Admit: 2023-01-09 | Payer: Medicare Other | Source: Ambulatory Visit

## 2023-01-09 VITALS — BP 110/72 | HR 98 | Temp 97.7°F | Resp 16 | Ht 62.0 in | Wt 176.6 lb

## 2023-01-09 DIAGNOSIS — R0602 Shortness of breath: Secondary | ICD-10-CM | POA: Diagnosis not present

## 2023-01-09 DIAGNOSIS — R059 Cough, unspecified: Secondary | ICD-10-CM | POA: Insufficient documentation

## 2023-01-09 DIAGNOSIS — R509 Fever, unspecified: Secondary | ICD-10-CM

## 2023-01-09 DIAGNOSIS — R0789 Other chest pain: Secondary | ICD-10-CM | POA: Diagnosis not present

## 2023-01-09 DIAGNOSIS — R5383 Other fatigue: Secondary | ICD-10-CM | POA: Diagnosis not present

## 2023-01-09 MED ORDER — ALBUTEROL SULFATE (2.5 MG/3ML) 0.083% IN NEBU
2.5000 mg | INHALATION_SOLUTION | Freq: Once | RESPIRATORY_TRACT | Status: AC
Start: 2023-01-09 — End: 2023-01-09
  Administered 2023-01-09: 2.5 mg via RESPIRATORY_TRACT

## 2023-01-09 NOTE — Progress Notes (Signed)
Patient ID: Patricia Richardson, female    DOB: Jun 26, 1950, 72 y.o.   MRN: [id number]  PCP: Berniece Salines, FNP  Chief Complaint  Patient presents with   Cough    Sx on going for over a week or more   Shortness of Breath   Fever   Fatigue    Subjective:   Patricia Richardson is a 72 y.o. female, presents to clinic with CC of the following:  Cough Associated symptoms include a fever and shortness of breath.  Shortness of Breath Associated symptoms include a fever.  Fever  Associated symptoms include coughing.     2 weeks ago started feeling tired and generalized weakness and fatigue Cough  ha congestion, drainage throat discomfort from coughing and darinage Feels like it takes her a minute to catch her breath, no pleuritic CP, no wheeze  Cough clear sputum gotten thicker Tried OTC congestion and cough syrup  No known sick contacts Felt a little better today No SOB with showering or getting into clinic today She had subjective fever/hot/cold chills - most recently yesterday, she has not taken any tylenol  Mild GI sx - stool a little loose   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   Prediabetes 06/20/2018   At risk for heart disease 06/20/2018   Essential hypertension 06/17/2018   Hx of completed stroke 06/17/2018   Obesity (BMI 35.0-39.9 without comorbidity) 06/17/2018   Status post total hip replacement, 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   Primary osteoarthritis of right hip 03/12/2017      Current Outpatient Medications:     acetaminophen (TYLENOL) 500 MG tablet, Take 1-2 tablets (500-1,000 mg total) by mouth every 6 (six) hours as needed for mild pain (pain score 1-3 or temp > 100.5)., Disp: , Rfl:    amLODipine (NORVASC) 10 MG tablet, Take 1 tablet (10 mg total) by mouth daily., Disp: 90 tablet, Rfl: 3   aspirin EC 81 MG tablet, Take 1 tablet (81 mg total) by mouth daily., Disp: 90 tablet, Rfl: 3   atorvastatin (LIPITOR) 10 MG tablet, TAKE 1 TABLET BY MOUTH AT  BEDTIME 2 NIGHTS WEEKLY, Disp: 100 tablet, Rfl: 0   carbidopa-levodopa (SINEMET IR) 25-100 MG tablet, Take 1 tablet by mouth 3 (three) times daily., Disp: , Rfl:    fluconazole (DIFLUCAN) 150 MG tablet, Take 1 tablet (150 mg total) by mouth every other day., Disp: 3 tablet, Rfl: 0   gabapentin (NEURONTIN) 100 MG capsule, Take 100 mg by mouth 4 (four) times daily., Disp: , Rfl:    hydrochlorothiazide (HYDRODIURIL) 25 MG tablet, Take 1 tablet (25 mg total) by mouth daily., Disp: 90 tablet, Rfl: 3   meloxicam (MOBIC) 15 MG tablet, Take 15 mg by mouth daily., Disp: , Rfl:    methimazole (TAPAZOLE) 5 MG tablet, Take by mouth., Disp: , Rfl:    omeprazole (PRILOSEC) 20 MG capsule, TAKE 1 CAPSULE BY MOUTH EVERY  OTHER DAY, Disp: 50 capsule, Rfl: 2   OVER THE COUNTER MEDICATION, Take 1 tablet by mouth daily as needed (constipation). Swiss  Kriss herbal laxative, Disp: , Rfl:    Allergies  Allergen Reactions   Codeine Itching    Pin and needles      Social History   Tobacco Use   Smoking status: Never   Smokeless tobacco: Never  Vaping Use   Vaping status: Never Used  Substance Use Topics   Alcohol use: Yes    Comment: rarely   Drug use: No      Chart Review Today: I personally reviewed active problem list, medication list, allergies, family history, social history, health maintenance, notes from last encounter, lab results, imaging with the patient/caregiver today.   Review of Systems  Constitutional:  Positive for fever.  HENT: Negative.     Eyes: Negative.   Respiratory:  Positive for cough and shortness of breath.   Cardiovascular: Negative.   Gastrointestinal: Negative.   Endocrine: Negative.   Genitourinary: Negative.   Musculoskeletal: Negative.   Skin: Negative.   Allergic/Immunologic: Negative.   Neurological: Negative.   Hematological: Negative.   Psychiatric/Behavioral: Negative.    All other systems reviewed and are negative.      Objective:   Vitals:   01/09/23 0812  BP: 110/72  Pulse: 98  Resp: 16  Temp: 97.7 F (36.5 C)  TempSrc: Oral  SpO2: 94%  Weight: 176 lb 9.6 oz (80.1 kg)  Height: 5\' 2"  (1.575 m)    Body mass index is 32.3 kg/m.  Physical Exam Vitals and nursing note reviewed.  Constitutional:      General: She is not in acute distress.    Appearance: She is well-developed. She is not ill-appearing, toxic-appearing or diaphoretic.  HENT:     Head: Normocephalic and atraumatic.     Right Ear: Hearing, tympanic membrane, ear canal and external ear normal.     Left Ear: Hearing, tympanic membrane, ear canal and external ear normal.     Nose: Mucosal edema, congestion and rhinorrhea present. Rhinorrhea is clear.     Right Sinus: Maxillary sinus tenderness and frontal sinus tenderness present.     Left Sinus: Maxillary sinus tenderness and frontal sinus tenderness present.     Mouth/Throat:     Mouth: Mucous membranes are normal. Mucous membranes are moist. Mucous membranes are not pale.     Pharynx: Oropharynx is clear. Uvula midline. Posterior oropharyngeal erythema present. No pharyngeal swelling, oropharyngeal exudate or uvula swelling.     Tonsils: No tonsillar exudate or tonsillar abscesses.  Eyes:     General: No scleral icterus.       Right eye: No discharge.        Left eye: No discharge.     Conjunctiva/sclera: Conjunctivae normal.  Neck:     Trachea: No tracheal deviation.  Cardiovascular:     Rate and Rhythm: Normal rate and regular rhythm.     Pulses: Normal pulses.      Heart sounds: Normal heart sounds.  Pulmonary:     Effort: Pulmonary effort is normal. No tachypnea, accessory muscle usage or respiratory distress.     Breath sounds: No stridor. Examination of the right-lower field reveals decreased breath sounds and rales. Examination of the left-lower field reveals decreased breath sounds and rales. Decreased breath sounds and rales present. No wheezing or rhonchi.     Comments: Poor inspiratory effort Abdominal:     General: Bowel sounds are normal. There is no distension.     Palpations: Abdomen is soft.     Tenderness: There is no abdominal tenderness. There is no guarding  or rebound.  Musculoskeletal:     Cervical back: Normal range of motion and neck supple.     Right lower leg: No edema.     Left lower leg: No edema.  Skin:    General: Skin is warm and dry.     Coloration: Skin is not pale.     Findings: No lesion or rash.  Neurological:     Mental Status: She is alert.     Motor: No abnormal muscle tone.     Comments: Tremor, staggering gait  Psychiatric:        Mood and Affect: Mood and affect normal.      Results for orders placed or performed in visit on 09/19/22  CBC with Differential/Platelet  Result Value Ref Range   WBC 8.6 3.8 - 10.8 Thousand/uL   RBC 4.77 3.80 - 5.10 Million/uL   Hemoglobin 14.3 11.7 - 15.5 g/dL   HCT 40.9 81.1 - 91.4 %   MCV 89.3 80.0 - 100.0 fL   MCH 30.0 27.0 - 33.0 pg   MCHC 33.6 32.0 - 36.0 g/dL   RDW 78.2 95.6 - 21.3 %   Platelets 368 140 - 400 Thousand/uL   MPV 10.4 7.5 - 12.5 fL   Neutro Abs 6,424 1,500 - 7,800 cells/uL   Lymphs Abs 1,496 850 - 3,900 cells/uL   Absolute Monocytes 464 200 - 950 cells/uL   Eosinophils Absolute 172 15 - 500 cells/uL   Basophils Absolute 43 0 - 200 cells/uL   Neutrophils Relative % 74.7 %   Total Lymphocyte 17.4 %   Monocytes Relative 5.4 %   Eosinophils Relative 2.0 %   Basophils Relative 0.5 %  COMPLETE METABOLIC PANEL WITH GFR  Result Value Ref Range    Glucose, Bld 123 (H) 65 - 99 mg/dL   BUN 22 7 - 25 mg/dL   Creat 0.86 5.78 - 4.69 mg/dL   eGFR 75 > OR = 60 GE/XBM/8.41L2   BUN/Creatinine Ratio SEE NOTE: 6 - 22 (calc)   Sodium 142 135 - 146 mmol/L   Potassium 3.9 3.5 - 5.3 mmol/L   Chloride 106 98 - 110 mmol/L   CO2 22 20 - 32 mmol/L   Calcium 10.0 8.6 - 10.4 mg/dL   Total Protein 7.0 6.1 - 8.1 g/dL   Albumin 4.5 3.6 - 5.1 g/dL   Globulin 2.5 1.9 - 3.7 g/dL (calc)   AG Ratio 1.8 1.0 - 2.5 (calc)   Total Bilirubin 0.4 0.2 - 1.2 mg/dL   Alkaline phosphatase (APISO) 71 37 - 153 U/L   AST 14 10 - 35 U/L   ALT 11 6 - 29 U/L  Hemoglobin A1c  Result Value Ref Range   Hgb A1c MFr Bld 5.9 (H) <5.7 % of total Hgb   Mean Plasma Glucose 123 mg/dL   eAG (mmol/L) 6.8 mmol/L  Lipid panel  Result Value Ref Range   Cholesterol 150 <200 mg/dL   HDL 60 > OR = 50 mg/dL   Triglycerides 440 <102 mg/dL   LDL Cholesterol (Calc) 69 mg/dL (calc)   Total CHOL/HDL Ratio 2.5 <5.0 (calc)   Non-HDL Cholesterol (Calc) 90 <725 mg/dL (calc)       Assessment & Plan:   1. Cough, unspecified type Ongoing x 2 weeks, getting more productive with coughing fits, SOB Lungs with some rales/crackles to bilateral bases with few times of deep inspiration - poor overall inspiratory effort - may have been some atelectasis Neb given, she noted only very minor  improvement in her feeling like she could "catch her breath" it did not change her BS when reexamined - going to do CXR  Will tx CAP vs bronchitis - Novel Coronavirus, NAA ([facility]) - DG Chest 2 View - albuterol (PROVENTIL) (2.5 MG/3ML) 0.083% nebulizer solution 2.5 mg  2. Fever, unspecified fever cause Last subjective fever yesterday, sx onset 1-2 weeks ago - out of window for paxlovid, likely a viral URI - Novel Coronavirus, NAA ([facility]) - DG Chest 2 View  3. Shortness of breath - DG Chest 2 View - albuterol (PROVENTIL) (2.5 MG/3ML) 0.083% nebulizer solution 2.5 mg    Neb in office due to  coughing fits and SOB in clinic, some limited inspiration and crackles at the bases Which did get better when I listened to her lungs a second time after getting her to stand up and move around room  After neb tx not much change in improvement She declined cough medications She is not having any distress, VSS, explained if everythign was negative she can continue with OTC medications and hopefully she will continue to improve.  We will call her with CXR results and plan later this afternoon Going for CXR now      Danelle Berry, PA-C 01/09/23 8:45 AM

## 2023-01-09 NOTE — Patient Instructions (Signed)
Go across the street to outpatient imaging and get your xray done and we will call you later this afternoon with the results and let you know the plan.

## 2023-01-10 ENCOUNTER — Ambulatory Visit: Payer: Self-pay | Admitting: *Deleted

## 2023-01-10 MED ORDER — PREDNISONE 20 MG PO TABS: ORAL_TABLET | ORAL | 0 refills | Status: DC

## 2023-01-10 NOTE — Addendum Note (Signed)
Addended by: Danelle Berry on: 01/10/2023 08:13 AM   Modules accepted: Orders

## 2023-01-10 NOTE — Telephone Encounter (Signed)
Summary: cough, chest congestion, fever   Seen provider [facility], yesterday.  Cough, chest congestion, headache, fever, will I be receiving any medications?         Patient states she has not received Rx yet- advised it was sent first thing this morning. She will pick up.  Reason for Disposition  [1] Prescription prescribed recently is not at pharmacy AND [2] triager has access to patient's EMR AND [3] prescription is recorded in the EMR  Answer Assessment - Initial Assessment Questions 1. NAME of MEDICINE: "What medicine(s) are you calling about?"     Prednisone 2. QUESTION: "What is your question?" (e.g., double dose of medicine, side effect)     Rx not at pharmacy- patient advised it was sent this morning 3. PRESCRIBER: "Who prescribed the medicine?" Reason: if prescribed by specialist, call should be referred to that group.     [facility] 4. SYMPTOMS: "Do you have any symptoms?" If Yes, ask: "What symptoms are you having?"  "How bad are the symptoms (e.g., mild, moderate, severe)     cough,chest congestion  Protocols used: Medication Question Call-A-AH

## 2023-01-10 NOTE — Telephone Encounter (Signed)
  Chief Complaint: Rx not at pharmacy-Prednisone  Disposition: [] ED /[] Urgent Care (no appt availability in office) / [] Appointment(In office/virtual)/ []  [facility]/ [x] Home Care/ [] Refused Recommended Disposition /[] [facility]/ []  Follow-up with PCP Additional Notes: Patient advise it was sent this morning and should be ready this morning

## 2023-01-18 ENCOUNTER — Ambulatory Visit: Payer: Self-pay | Admitting: *Deleted

## 2023-01-18 NOTE — Telephone Encounter (Signed)
FYI

## 2023-01-18 NOTE — Telephone Encounter (Signed)
Summary: covid symptoms   Per agent: "Pt called saying she was diagnosed with covid last week and states over all her symptoms were better but she has a lot of fatigue."           Chief Complaint: Fatigue Symptoms: Generalized weakness and fatigue S/P Covid last week. Frequency: 1 week Pertinent Negatives: Patient denies SOB Disposition: [] ED /[] Urgent Care (no appt availability in office) / [x] Appointment(In office/virtual)/ []  [facility]/ [] Home Care/ [] Refused Recommended Disposition /[] [facility]/ []  Follow-up with PCP Additional Notes: Pt states other covid symptoms have resolved, "Just so tired, have to stop and rest frequently doing anything." Generalized weakness. Secured first available according to pt's schedule. Would like to be seen sooner, placed on wait list.  Care advise provided, pt verbalizes understanding.  Reason for Disposition  [1] Fatigue (i.e., tires easily, decreased energy) AND [2] persists > 1 week  Answer Assessment - Initial Assessment Questions 1. DESCRIPTION: "Describe how you are feeling."     Weak, fatigued 2. SEVERITY: "How bad is it?"  "Can you stand and walk?"   - MILD (0-3): Feels weak or tired, but does not interfere with work, school or normal activities.   - MODERATE (4-7): Able to stand and walk; weakness interferes with work, school, or normal activities.   - SEVERE (8-10): Unable to stand or walk; unable to do usual activities.     Need to rest often 3. ONSET: "When did these symptoms begin?" (e.g., hours, days, weeks, months)     With Covid 4. CAUSE: "What do you think is causing the weakness or fatigue?" (e.g., not drinking enough fluids, medical problem, trouble sleeping)     Covid 5. NEW MEDICINES:  "Have you started on any new medicines recently?" (e.g., opioid pain medicines, benzodiazepines, muscle relaxants, antidepressants, antihistamines, neuroleptics, beta blockers)     no 6. OTHER SYMPTOMS: "Do you have  any other symptoms?" (e.g., chest pain, fever, cough, SOB, vomiting, diarrhea, bleeding, other areas of pain)    "Just tired"  Protocols used: Weakness (Generalized) and Fatigue-A-AH

## 2023-01-22 DIAGNOSIS — H40003 Preglaucoma, unspecified, bilateral: Secondary | ICD-10-CM | POA: Diagnosis not present

## 2023-01-22 DIAGNOSIS — Z961 Presence of intraocular lens: Secondary | ICD-10-CM | POA: Diagnosis not present

## 2023-01-24 ENCOUNTER — Ambulatory Visit (INDEPENDENT_AMBULATORY_CARE_PROVIDER_SITE_OTHER): Payer: Medicare Other | Admitting: Nurse Practitioner

## 2023-01-24 ENCOUNTER — Encounter: Payer: Self-pay | Admitting: Nurse Practitioner

## 2023-01-24 VITALS — BP 122/72 | HR 95 | Temp 97.6°F | Resp 16 | Ht 62.0 in | Wt 180.5 lb

## 2023-01-24 DIAGNOSIS — R5381 Other malaise: Secondary | ICD-10-CM | POA: Diagnosis not present

## 2023-01-24 DIAGNOSIS — U071 COVID-19: Secondary | ICD-10-CM | POA: Diagnosis not present

## 2023-01-24 DIAGNOSIS — G9331 Postviral fatigue syndrome: Secondary | ICD-10-CM | POA: Diagnosis not present

## 2023-01-24 DIAGNOSIS — R5383 Other fatigue: Secondary | ICD-10-CM | POA: Diagnosis not present

## 2023-01-24 NOTE — Progress Notes (Signed)
BP 122/72   Pulse 95   Temp 97.6 F (36.4 C) (Oral)   Resp 16   Ht 5\' 2"  (1.575 m)   Wt 180 lb 8 oz (81.9 kg)   SpO2 98%   BMI 33.01 kg/m    Subjective:    Patient ID: Patricia Richardson, female    DOB: 31-May-1951, 72 y.o.   MRN: [id number]  HPI: Patricia Richardson is a 72 y.o. female  Chief Complaint  Patient presents with   Fatigue    Continues having tiredness after being positive for COVID   Covid/fatigue: patient was diagnosed with covid on 01/09/2023.  She symptoms had started about a week or more before she was seen.  Patient reports that she still has a cough,  she says she still feels winded after activity. She denies any fever.  She reports she just feels run down. She says that she has very little energy.  Discussed that covid fatigue could last for weeks to months.  Encouraged physical activity, getting rest and drinking water.   Will get labs to check for other causes.  If no underlying cause will refer back to neurology.  Patient is established with Dr. Malvin Johns.   Relevant past medical, surgical, family and social history reviewed and updated as indicated. Interim medical history since our last visit reviewed. Allergies and medications reviewed and updated.  Review of Systems  Constitutional: Negative for fever or weight change. Positive for fatigue Respiratory: Negative for cough and shortness of breath.   Cardiovascular: Negative for chest pain or palpitations.  Gastrointestinal: Negative for abdominal pain, no bowel changes.  Musculoskeletal: Negative for gait problem or joint swelling.  Skin: Negative for rash.  Neurological: Negative for dizziness or headache.  No other specific complaints in a complete review of systems (except as listed in HPI above).      Objective:    BP 122/72   Pulse 95   Temp 97.6 F (36.4 C) (Oral)   Resp 16   Ht 5\' 2"  (1.575 m)   Wt 180 lb 8 oz (81.9 kg)   SpO2 98%   BMI 33.01 kg/m   Wt Readings from Last 3 Encounters:  01/24/23 180  lb 8 oz (81.9 kg)  01/09/23 176 lb 9.6 oz (80.1 kg)  09/19/22 184 lb 11.2 oz (83.8 kg)    Physical Exam  Constitutional: Patient appears well-developed and well-nourished. Obese  No distress.  HEENT: head atraumatic, normocephalic, pupils equal and reactive to light, neck supple, throat within normal limits Cardiovascular: Normal rate, regular rhythm and normal heart sounds.  No murmur heard. No BLE edema. Pulmonary/Chest: Effort normal and breath sounds normal. No respiratory distress. Abdominal: Soft.  There is no tenderness. Psychiatric: Patient has a normal mood and affect. behavior is normal. Judgment and thought content normal.   Results for orders placed or performed in visit on 01/09/23  Novel Coronavirus, NAA ([facility])   Specimen: Nasopharyngeal(NP) swabs in vial transport medium   Nasopharynge  Resident  Result Value Ref Range   SARS-CoV-2, NAA Detected (A) Not Detected      Assessment & Plan:   Problem List Items Addressed This Visit   None Visit Diagnoses     COVID-19    -  Primary   Relevant Orders   CBC with Differential/Platelet   COMPLETE METABOLIC PANEL WITH GFR   TSH   VITAMIN D 25 Hydroxy (Vit-D Deficiency, Fractures)   B12 and Folate Panel   Iron, TIBC and Ferritin Panel  Postviral fatigue syndrome       Relevant Orders   CBC with Differential/Platelet   COMPLETE METABOLIC PANEL WITH GFR   TSH   VITAMIN D 25 Hydroxy (Vit-D Deficiency, Fractures)   B12 and Folate Panel   Iron, TIBC and Ferritin Panel        Follow up plan: No follow-ups on file.

## 2023-01-25 ENCOUNTER — Ambulatory Visit (INDEPENDENT_AMBULATORY_CARE_PROVIDER_SITE_OTHER): Payer: Medicare Other

## 2023-01-25 DIAGNOSIS — E538 Deficiency of other specified B group vitamins: Secondary | ICD-10-CM

## 2023-01-25 LAB — IRON,TIBC AND FERRITIN PANEL
%SAT: 25 % (ref 16–45)
Ferritin: 85 ng/mL (ref 16–288)
Iron: 72 ug/dL (ref 45–160)
TIBC: 283 ug/dL (ref 250–450)

## 2023-01-25 LAB — COMPLETE METABOLIC PANEL WITH GFR
AG Ratio: 1.5 (calc) (ref 1.0–2.5)
ALT: 4 U/L — ABNORMAL LOW (ref 6–29)
AST: 12 U/L (ref 10–35)
Albumin: 4.1 g/dL (ref 3.6–5.1)
Alkaline phosphatase (APISO): 73 U/L (ref 37–153)
BUN: 18 mg/dL (ref 7–25)
CO2: 26 mmol/L (ref 20–32)
Calcium: 9.8 mg/dL (ref 8.6–10.4)
Chloride: 102 mmol/L (ref 98–110)
Creat: 0.79 mg/dL (ref 0.60–1.00)
Globulin: 2.8 g/dL (ref 1.9–3.7)
Glucose, Bld: 92 mg/dL (ref 65–99)
Potassium: 3.6 mmol/L (ref 3.5–5.3)
Sodium: 140 mmol/L (ref 135–146)
Total Bilirubin: 0.5 mg/dL (ref 0.2–1.2)
Total Protein: 6.9 g/dL (ref 6.1–8.1)
eGFR: 78 mL/min/{1.73_m2} (ref >=60)

## 2023-01-25 LAB — CBC WITH DIFFERENTIAL/PLATELET
Absolute Monocytes: 509 {cells}/uL (ref 200–950)
Basophils Absolute: 38 {cells}/uL (ref 0–200)
Basophils Relative: 0.4 %
Eosinophils Absolute: 86 {cells}/uL (ref 15–500)
Eosinophils Relative: 0.9 %
HCT: 44.8 % (ref 35.0–45.0)
Hemoglobin: 15 g/dL (ref 11.7–15.5)
Lymphs Abs: 1382 {cells}/uL (ref 850–3900)
MCH: 29.9 pg (ref 27.0–33.0)
MCHC: 33.5 g/dL (ref 32.0–36.0)
MCV: 89.4 fL (ref 80.0–100.0)
MPV: 10.6 fL (ref 7.5–12.5)
Monocytes Relative: 5.3 %
Neutro Abs: 7584 {cells}/uL (ref 1500–7800)
Neutrophils Relative %: 79 %
Platelets: 359 10*3/uL (ref 140–400)
RBC: 5.01 10*6/uL (ref 3.80–5.10)
RDW: 12.7 % (ref 11.0–15.0)
Total Lymphocyte: 14.4 %
WBC: 9.6 10*3/uL (ref 3.8–10.8)

## 2023-01-25 LAB — TSH: TSH: 0.55 m[IU]/L (ref 0.40–4.50)

## 2023-01-25 LAB — B12 AND FOLATE PANEL
Folate: 11.7 ng/mL
Vitamin B-12: 390 pg/mL (ref 200–1100)

## 2023-01-25 LAB — VITAMIN D 25 HYDROXY (VIT D DEFICIENCY, FRACTURES): Vit D, 25-Hydroxy: 33 ng/mL (ref 30–100)

## 2023-01-25 MED ORDER — CYANOCOBALAMIN 1000 MCG/ML IJ SOLN
1000.0000 ug | Freq: Once | INTRAMUSCULAR | Status: AC
Start: 2023-01-25 — End: 2023-01-25
  Administered 2023-01-25: 1000 ug via INTRAMUSCULAR

## 2023-02-27 ENCOUNTER — Ambulatory Visit (INDEPENDENT_AMBULATORY_CARE_PROVIDER_SITE_OTHER): Payer: Medicare Other

## 2023-02-27 DIAGNOSIS — M545 Low back pain, unspecified: Secondary | ICD-10-CM | POA: Diagnosis not present

## 2023-02-27 DIAGNOSIS — E538 Deficiency of other specified B group vitamins: Secondary | ICD-10-CM

## 2023-02-27 DIAGNOSIS — R262 Difficulty in walking, not elsewhere classified: Secondary | ICD-10-CM | POA: Diagnosis not present

## 2023-02-27 DIAGNOSIS — G20C Parkinsonism, unspecified: Secondary | ICD-10-CM | POA: Diagnosis not present

## 2023-02-27 DIAGNOSIS — R498 Other voice and resonance disorders: Secondary | ICD-10-CM | POA: Diagnosis not present

## 2023-02-27 DIAGNOSIS — R413 Other amnesia: Secondary | ICD-10-CM | POA: Diagnosis not present

## 2023-02-27 DIAGNOSIS — G8929 Other chronic pain: Secondary | ICD-10-CM | POA: Diagnosis not present

## 2023-02-27 MED ORDER — CYANOCOBALAMIN 1000 MCG/ML IJ SOLN
1000.0000 ug | Freq: Once | INTRAMUSCULAR | Status: AC
Start: 2023-02-27 — End: 2023-02-27
  Administered 2023-02-27: 1000 ug via INTRAMUSCULAR

## 2023-02-27 NOTE — Progress Notes (Signed)
Patient presented to clinic in expressed good health for her monthly B-12 injection. Patient tolerated injection well to the left deltoid with no immediate adverse reactions noted. Patient had no questions or concerns at time of clinic departure.

## 2023-02-28 DIAGNOSIS — E042 Nontoxic multinodular goiter: Secondary | ICD-10-CM | POA: Diagnosis not present

## 2023-03-08 DIAGNOSIS — E042 Nontoxic multinodular goiter: Secondary | ICD-10-CM | POA: Diagnosis not present

## 2023-03-08 DIAGNOSIS — E0590 Thyrotoxicosis, unspecified without thyrotoxic crisis or storm: Secondary | ICD-10-CM | POA: Diagnosis not present

## 2023-03-13 ENCOUNTER — Telehealth: Payer: Self-pay | Admitting: Nurse Practitioner

## 2023-03-13 ENCOUNTER — Other Ambulatory Visit: Payer: Self-pay

## 2023-03-13 DIAGNOSIS — Z860100 Personal history of colon polyps, unspecified: Secondary | ICD-10-CM

## 2023-03-13 DIAGNOSIS — R933 Abnormal findings on diagnostic imaging of other parts of digestive tract: Secondary | ICD-10-CM

## 2023-03-13 NOTE — Telephone Encounter (Signed)
Order placed for repeat colonoscopy.

## 2023-03-13 NOTE — Telephone Encounter (Signed)
Copied from CRM [id number]. Topic: General - Other >> Mar 13, 2023  8:30 AM Patricia Richardson wrote: Patient called and stated that it is time for her to get a colonoscopy. Her insurance requires a prior auth from her pcp. Please advise.

## 2023-03-13 NOTE — Telephone Encounter (Signed)
FYI

## 2023-03-13 NOTE — Telephone Encounter (Signed)
Patient has called back in regards to her colonoscopy, stating that she spoke with her GI office and her GI Dr. wants her to have a colonoscopy done every year instead of every 3 years due to the type of polyps that was found and her GI office will be sending a letter over stating this.  Patients callback #[phone]

## 2023-03-15 DIAGNOSIS — E042 Nontoxic multinodular goiter: Secondary | ICD-10-CM | POA: Diagnosis not present

## 2023-03-15 DIAGNOSIS — E0590 Thyrotoxicosis, unspecified without thyrotoxic crisis or storm: Secondary | ICD-10-CM | POA: Diagnosis not present

## 2023-03-19 ENCOUNTER — Other Ambulatory Visit: Payer: Self-pay | Admitting: Nurse Practitioner

## 2023-03-19 DIAGNOSIS — Z1231 Encounter for screening mammogram for malignant neoplasm of breast: Secondary | ICD-10-CM

## 2023-03-26 ENCOUNTER — Other Ambulatory Visit: Payer: Self-pay

## 2023-03-26 ENCOUNTER — Encounter: Payer: Self-pay | Admitting: Nurse Practitioner

## 2023-03-26 ENCOUNTER — Ambulatory Visit (INDEPENDENT_AMBULATORY_CARE_PROVIDER_SITE_OTHER): Payer: Medicare Other | Admitting: Nurse Practitioner

## 2023-03-26 VITALS — BP 122/84 | HR 86 | Temp 97.9°F | Resp 16 | Ht 62.0 in | Wt 189.7 lb

## 2023-03-26 DIAGNOSIS — Z0000 Encounter for general adult medical examination without abnormal findings: Secondary | ICD-10-CM | POA: Diagnosis not present

## 2023-03-26 DIAGNOSIS — E538 Deficiency of other specified B group vitamins: Secondary | ICD-10-CM

## 2023-03-26 DIAGNOSIS — Z23 Encounter for immunization: Secondary | ICD-10-CM | POA: Diagnosis not present

## 2023-03-26 MED ORDER — CYANOCOBALAMIN 1000 MCG/ML IJ SOLN
1000.0000 ug | Freq: Once | INTRAMUSCULAR | Status: AC
Start: 2023-03-26 — End: 2023-03-26
  Administered 2023-03-26: 1000 ug via INTRAMUSCULAR

## 2023-03-26 NOTE — Progress Notes (Signed)
Name: Patricia Richardson   MRN: [id number]    DOB: 05/03/1951   Date:03/26/2023       Progress Note  Subjective  Chief Complaint  Chief Complaint  Patient presents with   Annual Exam    HPI  Patient presents for annual CPE.  Diet: Regular, tries to eat well balanced diet Exercise: 2 days 30 minutes , recommend 150 min of physical activity weekly   Sleep: 8 hours Last dental exam:June 2024 Last eye exam: April 2024  Flowsheet Row Office Visit from 03/26/2023 in [facility]  AUDIT-C Score 0      Depression: Phq 9 is  negative    03/26/2023    9:52 AM 01/24/2023   10:43 AM 01/09/2023    8:12 AM 11/23/2022   10:12 AM 09/19/2022   12:58 PM  Depression screen PHQ 2/9  Decreased Interest 0 0 0 0 0  Down, Depressed, Hopeless 0 0 0 0 0  PHQ - 2 Score 0 0 0 0 0  Altered sleeping  0 0 0 0  Tired, decreased energy  0 0 0 0  Change in appetite  0 0 0 0  Feeling bad or failure about yourself   0 0 0 0  Trouble concentrating  0 0 0 0  Moving slowly or fidgety/restless  0 0 0 0  Suicidal thoughts  0 0 0 0  PHQ-9 Score  0 0 0 0  Difficult doing work/chores  Not difficult at all Not difficult at all  Not difficult at all   Hypertension: BP Readings from Last 3 Encounters:  03/26/23 122/84  01/24/23 122/72  01/09/23 110/72   Obesity: Wt Readings from Last 3 Encounters:  03/26/23 189 lb 11.2 oz (86 kg)  01/24/23 180 lb 8 oz (81.9 kg)  01/09/23 176 lb 9.6 oz (80.1 kg)   BMI Readings from Last 3 Encounters:  03/26/23 34.70 kg/m  01/24/23 33.01 kg/m  01/09/23 32.30 kg/m     Vaccines:  HPV: up to at age 33 , ask insurance if age between 61-45  Shingrix: 87-64 yo and ask insurance if covered when patient above 63 yo Pneumonia:  educated and discussed with patient. Flu:  educated and discussed with patient.  Hep C Screening: completed STD testing and prevention (HIV/chl/gon/syphilis): completed Intimate partner violence:NA Sexual History :yes  sexually active Menstrual History/LMP/Abnormal Bleeding: hysterectomy Incontinence Symptoms: stress incontinence   Breast cancer:  - Last Mammogram: 05/17/2022, scheduled on 04/20/2023 - BRCA gene screening: 03/12/2019  Osteoporosis: Discussed high calcium and vitamin D supplementation, weight bearing exercises  Cervical cancer screening: hysterectomy   Skin cancer: Discussed monitoring for atypical lesions  Colorectal cancer: 04/21/2022   Lung cancer:   Low Dose CT Chest recommended if Age 25-80 years, 20 pack-year currently smoking OR have quit w/in 15years. Patient does not qualify.   ECG: 03/29/2018  Advanced Care Planning: A voluntary discussion about advance care planning including the explanation and discussion of advance directives.  Discussed health care proxy and Living will, and the patient was able to identify a health care proxy as son.  Patient does have a living will at present time. If patient does have living will, I have requested they bring this to the clinic to be scanned in to their chart.  Lipids: Lab Results  Component Value Date   CHOL 150 09/19/2022   CHOL 172 03/20/2022   CHOL 155 03/14/2021   Lab Results  Component Value Date   HDL 60 09/19/2022  HDL 65 03/20/2022   HDL 59 03/14/2021   Lab Results  Component Value Date   LDLCALC 69 09/19/2022   LDLCALC 86 03/20/2022   LDLCALC 75 03/14/2021   Lab Results  Component Value Date   TRIG 128 09/19/2022   TRIG 118 03/20/2022   TRIG 130 03/14/2021   Lab Results  Component Value Date   CHOLHDL 2.5 09/19/2022   CHOLHDL 2.6 03/20/2022   CHOLHDL 2.6 03/14/2021   No results found for: "LDLDIRECT"  Glucose: Glucose, Bld  Date Value Ref Range Status  01/24/2023 92 65 - 99 mg/dL Final    Comment:    .            Fasting reference interval .   09/19/2022 123 (H) 65 - 99 mg/dL Final    Comment:    .            Fasting reference interval . For someone without known diabetes, a glucose  value between 100 and 125 mg/dL is consistent with prediabetes and should be confirmed with a follow-up test. .   03/20/2022 99 65 - 99 mg/dL Final    Comment:    .            Fasting reference interval .    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   Prediabetes 06/20/2018   At risk for heart disease 06/20/2018   Essential hypertension 06/17/2018   Hx of completed stroke 06/17/2018   Obesity (BMI 35.0-39.9 without comorbidity) 06/17/2018   Status post total hip replacement, 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   Primary osteoarthritis of right hip 03/12/2017    Past Surgical History:  Procedure Laterality Date   ABDOMINAL HYSTERECTOMY     BREAST BIOPSY Right 11/03/2005   core biopsy w/ clip placement   CATARACT EXTRACTION W/PHACO Right 09/15/2020   Procedure: CATARACT EXTRACTION PHACO AND INTRAOCULAR LENS PLACEMENT (IOC) RIGHT;  Surgeon: Lockie Mola, MD;  Location: [facility];  Service: Ophthalmology;  Laterality: Right;  4.70 0:57.4 8.2%   CATARACT EXTRACTION W/PHACO Left 09/29/2020   Procedure: CATARACT EXTRACTION PHACO AND INTRAOCULAR LENS PLACEMENT (IOC) LEFT 3.01 00:45.7 6.6%;  Surgeon: Lockie Mola, MD;  Location: [facility];  Service: Ophthalmology;  Laterality: Left;   CHOLECYSTECTOMY     COLONOSCOPY     COLONOSCOPY WITH PROPOFOL N/A 11/05/2015   Procedure: COLONOSCOPY WITH PROPOFOL;  Surgeon:  Christena Deem, MD;  Location: [facility];  Service: Endoscopy;  Laterality: N/A;   COLONOSCOPY WITH PROPOFOL N/A 04/21/2022   Procedure: COLONOSCOPY WITH PROPOFOL;  Surgeon: Jaynie Collins, DO;  Location: [facility] ENDOSCOPY;  Service: Gastroenterology;  Laterality: N/A;   ESOPHAGOGASTRODUODENOSCOPY (EGD) WITH PROPOFOL N/A 04/21/2022   Procedure: ESOPHAGOGASTRODUODENOSCOPY (EGD) WITH PROPOFOL;  Surgeon: Jaynie Collins, DO;  Location: [facility] END[facility]COPY;  Service: Gastroenterology;  Laterality: N/A;   JOINT REPLACEMENT  04-09-2018   KNEE ARTHROSCOPY Right 02/21/2008   TOTAL HIP ARTHROPLASTY Right 04/09/2018   Procedure: TOTAL  HIP ARTHROPLASTY ANTERIOR APPROACH;  Surgeon: Kennedy Bucker, MD;  Location: [facility] ORS;  Service: Orthopedics;  Laterality: Right;   WISDOM TOOTH EXTRACTION      Family History  Problem Relation Age of Onset   Breast cancer Maternal Aunt        great   Kidney disease Mother    Diabetes Mother    Hypertension Father    Kidney disease Father    Diabetes Brother    Diabetes Paternal Grandmother     Social History   Socioeconomic History   Marital status: Divorced    Spouse name: Not on file   Number of children: 2   Years of education: 12   Highest education level: 12th grade  Occupational History   Not on file  Tobacco Use   Smoking status: Never   Smokeless tobacco: Never  Vaping Use   Vaping status: Never Used  Substance and Sexual Activity   Alcohol use: Yes    Comment: rarely   Drug use: No   Sexual activity: Not Currently  Other Topics Concern   Not on file  Social History Narrative   Pt lives alone   Social Determinants of Health   Financial Resource Strain: Low Risk  (03/26/2023)   Overall Financial Resource Strain (CARDIA)    Difficulty of Paying Living Expenses: Not hard at all  Food Insecurity: No Food Insecurity (03/26/2023)   Hunger Vital Sign    Worried About Running Out of Food in the Last Year: Never true    Ran Out  of Food in the Last Year: Never true  Transportation Needs: No Transportation Needs (03/26/2023)   PRAPARE - Administrator, Civil Service (Medical): No    Lack of Transportation (Non-Medical): No  Physical Activity: Insufficiently Active (03/26/2023)   Exercise Vital Sign    Days of Exercise per Week: 2 days    Minutes of Exercise per Session: 30 min  Stress: No Stress Concern Present (03/26/2023)   [employer] of Occupational Health - Occupational Stress Questionnaire    Feeling of Stress : Only a little  Social Connections: Moderately Integrated (03/26/2023)   Social Connection and Isolation Panel [NHANES]    Frequency of Communication with Friends and Family: More than three times a week    Frequency of Social Gatherings with Friends and Family: More than three times a week    Attends Religious Services: More than 4 times per year    Active Member of [employer] or Organizations: Yes    Attends Engineer, structural: More than 4 times per year    Marital Status: Divorced  Intimate Partner Violence: Not At Risk (03/26/2023)   Humiliation, Afraid, Rape, and Kick questionnaire    Fear of Current or Ex-Partner: No    Emotionally Abused: No    Physically Abused: No    Sexually Abused: No     Current Outpatient Medications:    acetaminophen (TYLENOL) 500 MG tablet, Take 1-2 tablets (500-1,000 mg total) by mouth every 6 (six) hours as needed for mild pain (pain score 1-3 or temp > 100.5)., Disp: , Rfl:    amLODipine (NORVASC) 10 MG tablet, Take 1 tablet (10 mg total) by mouth daily., Disp: 90 tablet, Rfl: 3   aspirin EC 81 MG tablet, Take 1 tablet (81 mg total) by mouth daily., Disp: 90 tablet, Rfl: 3   atorvastatin (LIPITOR) 10 MG tablet, TAKE 1 TABLET BY MOUTH AT  BEDTIME 2 NIGHTS WEEKLY, Disp: 100 tablet, Rfl:  0   carbidopa-levodopa (SINEMET IR) 25-100 MG tablet, Take 1 tablet by mouth 3 (three) times daily., Disp: , Rfl:    escitalopram (LEXAPRO) 10 MG tablet,  Take 10 mg by mouth at bedtime. 2 at night, Disp: , Rfl:    gabapentin (NEURONTIN) 100 MG capsule, Take 100 mg by mouth 4 (four) times daily., Disp: , Rfl:    hydrochlorothiazide (HYDRODIURIL) 25 MG tablet, Take 1 tablet (25 mg total) by mouth daily., Disp: 90 tablet, Rfl: 3   meloxicam (MOBIC) 15 MG tablet, Take 15 mg by mouth daily., Disp: , Rfl:    methimazole (TAPAZOLE) 5 MG tablet, Take by mouth., Disp: , Rfl:    omeprazole (PRILOSEC) 20 MG capsule, TAKE 1 CAPSULE BY MOUTH EVERY  OTHER DAY, Disp: 50 capsule, Rfl: 2   OVER THE COUNTER MEDICATION, Take 1 tablet by mouth daily as needed (constipation). Swiss Kriss herbal laxative, Disp: , Rfl:    Amantadine HCl 100 MG tablet, Take 100 mg by mouth 2 (two) times daily., Disp: , Rfl:    fluconazole (DIFLUCAN) 150 MG tablet, Take 1 tablet (150 mg total) by mouth every other day. (Patient not taking: Reported on 03/26/2023), Disp: 3 tablet, Rfl: 0   predniSONE (DELTASONE) 20 MG tablet, 2 tabs poqday 1-3, 1 tabs poqday 4-6, take in morning with food (Patient not taking: Reported on 03/26/2023), Disp: 9 tablet, Rfl: 0  Current Facility-Administered Medications:    cyanocobalamin (VITAMIN B12) injection 1,000 mcg, 1,000 mcg, Intramuscular, Once, Berniece Salines, FNP  Allergies  Allergen Reactions   Codeine Itching    Pin and needles      ROS  Constitutional: Negative for fever or weight change.  Respiratory: Negative for cough and shortness of breath.   Cardiovascular: Negative for chest pain or palpitations.  Gastrointestinal: Negative for abdominal pain, no bowel changes.  Musculoskeletal: Negative for gait problem or joint swelling.  Skin: Negative for rash.  Neurological: Negative for dizziness or headache.  No other specific complaints in a complete review of systems (except as listed in HPI above).   Objective  Vitals:   03/26/23 0946  BP: 122/84  Pulse: 86  Resp: 16  Temp: 97.9 F (36.6 C)  TempSrc: Oral  SpO2: 98%   Weight: 189 lb 11.2 oz (86 kg)  Height: 5\' 2"  (1.575 m)    Body mass index is 34.7 kg/m.  Physical Exam Constitutional: Patient appears well-developed and well-nourished. No distress.  HENT: Head: Normocephalic and atraumatic. Ears: B TMs ok, no erythema or effusion; Nose: Nose normal. Mouth/Throat: Oropharynx is clear and moist. No oropharyngeal exudate.  Eyes: Conjunctivae and EOM are normal. Pupils are equal, round, and reactive to light. No scleral icterus.  Neck: Normal range of motion. Neck supple. No JVD present. No thyromegaly present.  Cardiovascular: Normal rate, regular rhythm and normal heart sounds.  No murmur heard. No BLE edema. Pulmonary/Chest: Effort normal and breath sounds normal. No respiratory distress. Abdominal: Soft. Bowel sounds are normal, no distension. There is no tenderness. no masses Breast: no lumps or masses, no nipple discharge or rashes Musculoskeletal: Normal range of motion, no joint effusions. No gross deformities Neurological: he is alert and oriented to person, place, and time. No cranial nerve deficit. Coordination, balance, strength, speech and gait are normal.  Skin: Skin is warm and dry. No rash noted. No erythema.  Psychiatric: Patient has a normal mood and affect. behavior is normal. Judgment and thought content normal.      Fall Risk:  03/26/2023    9:52 AM 01/24/2023   10:43 AM 01/09/2023    8:12 AM 11/23/2022   10:12 AM 09/19/2022   12:58 PM  Fall Risk   Falls in the past year? 0 0 0 0 0  Number falls in past yr: 0 0 0 0 0  Injury with Fall? 0 0 0 0 0  Risk for fall due to : No Fall Risks No Fall Risks No Fall Risks No Fall Risks   Follow up Falls prevention discussed Falls prevention discussed;Education provided;Falls evaluation completed Falls prevention discussed;Education provided;Falls evaluation completed Falls prevention discussed      Functional Status Survey: Is the patient deaf or have difficulty hearing?: No Does the  patient have difficulty seeing, even when wearing glasses/contacts?: No Does the patient have difficulty concentrating, remembering, or making decisions?: No Does the patient have difficulty walking or climbing stairs?: Yes Does the patient have difficulty dressing or bathing?: No Does the patient have difficulty doing errands alone such as visiting a doctor's office or shopping?: No   Assessment & Plan  1. Annual physical exam Labs recently done Increase physical activity as tolerated Continue to eat well balanced diet Has appointment for mammogram Has appointment with GI for repeat colonoscopy   2. Need for influenza vaccination  - Flu Vaccine Trivalent High Dose (Fluad)  3. B12 deficiency  - cyanocobalamin (VITAMIN B12) injection 1,000 mcg   -USPSTF grade A and B recommendations reviewed with patient; age-appropriate recommendations, preventive care, screening tests, etc discussed and encouraged; healthy living encouraged; see AVS for patient education given to patient -Discussed importance of 150 minutes of physical activity weekly, eat two servings of fish weekly, eat one serving of tree nuts ( cashews, pistachios, pecans, almonds.Marland Kitchen) every other day, eat 6 servings of fruit/vegetables daily and drink plenty of water and avoid sweet beverages.   -Reviewed Health Maintenance: yes

## 2023-03-28 DIAGNOSIS — G8929 Other chronic pain: Secondary | ICD-10-CM | POA: Diagnosis not present

## 2023-03-28 DIAGNOSIS — R498 Other voice and resonance disorders: Secondary | ICD-10-CM | POA: Diagnosis not present

## 2023-03-28 DIAGNOSIS — G20C Parkinsonism, unspecified: Secondary | ICD-10-CM | POA: Diagnosis not present

## 2023-03-28 DIAGNOSIS — M545 Low back pain, unspecified: Secondary | ICD-10-CM | POA: Diagnosis not present

## 2023-04-03 ENCOUNTER — Ambulatory Visit: Payer: Medicare Other

## 2023-04-12 ENCOUNTER — Ambulatory Visit: Payer: Medicare Other

## 2023-04-12 DIAGNOSIS — Z1211 Encounter for screening for malignant neoplasm of colon: Secondary | ICD-10-CM

## 2023-04-12 DIAGNOSIS — Z0000 Encounter for general adult medical examination without abnormal findings: Secondary | ICD-10-CM

## 2023-04-12 NOTE — Progress Notes (Signed)
Subjective:   Patricia Richardson is a 72 y.o. female who presents for Medicare Annual (Subsequent) preventive examination.  Visit Complete: Virtual I connected with  Patricia Richardson on 04/12/23 by a audio enabled telemedicine application and verified that I am speaking with the correct person using two identifiers.  Patient Location: Home  Provider Location: Office/Clinic  I discussed the limitations of evaluation and management by telemedicine. The patient expressed understanding and agreed to proceed.  Vital Signs: Because this visit was a virtual/telehealth visit, some criteria may be missing or patient reported. Any vitals not documented were not able to be obtained and vitals that have been documented are patient reported.   Cardiac Risk Factors include: advanced age (>39men, >17 women);dyslipidemia;hypertension;obesity (BMI >30kg/m2)     Objective:    There were no vitals filed for this visit. There is no height or weight on file to calculate BMI.     04/12/2023   10:18 AM 04/21/2022    7:55 AM 04/06/2022    8:38 AM 04/05/2021    9:04 AM 09/29/2020    7:52 AM 09/15/2020    7:38 AM 03/04/2020    8:25 AM  Advanced Directives  Does Patient Have a Medical Advance Directive? No Yes No Yes Yes Yes Yes  Type of Advance Directive  Living will  Healthcare Power of [place];Living will Healthcare Power of [place];Living will Healthcare Power of [place];Living will Healthcare Power of [place];Living will  Does patient want to make changes to medical advance directive?     No - Patient declined No - Patient declined   Copy of Healthcare Power of Attorney in Chart?    No - copy requested No - copy requested Yes - validated most recent copy scanned in chart (See row information) No - copy requested  Would patient like information on creating a medical advance directive? No - Patient declined  No - Patient declined        Current Medications (verified) Outpatient Encounter Medications as of  04/12/2023  Medication Sig   acetaminophen (TYLENOL) 500 MG tablet Take 1-2 tablets (500-1,000 mg total) by mouth every 6 (six) hours as needed for mild pain (pain score 1-3 or temp > 100.5).   Amantadine HCl 100 MG tablet Take 100 mg by mouth 2 (two) times daily.   amLODipine (NORVASC) 10 MG tablet Take 1 tablet (10 mg total) by mouth daily.   aspirin EC 81 MG tablet Take 1 tablet (81 mg total) by mouth daily.   atorvastatin (LIPITOR) 10 MG tablet TAKE 1 TABLET BY MOUTH AT  BEDTIME 2 NIGHTS WEEKLY   carbidopa-levodopa (SINEMET IR) 25-100 MG tablet Take 1 tablet by mouth 3 (three) times daily.   escitalopram (LEXAPRO) 10 MG tablet Take 10 mg by mouth at bedtime. 2 at night   gabapentin (NEURONTIN) 100 MG capsule Take 100 mg by mouth 4 (four) times daily.   hydrochlorothiazide (HYDRODIURIL) 25 MG tablet Take 1 tablet (25 mg total) by mouth daily.   meloxicam (MOBIC) 15 MG tablet Take 15 mg by mouth daily.   methimazole (TAPAZOLE) 5 MG tablet Take by mouth.   omeprazole (PRILOSEC) 20 MG capsule TAKE 1 CAPSULE BY MOUTH EVERY  OTHER DAY   OVER THE COUNTER MEDICATION Take 1 tablet by mouth daily as needed (constipation). Swiss Kriss herbal laxative   fluconazole (DIFLUCAN) 150 MG tablet Take 1 tablet (150 mg total) by mouth every other day. (Patient not taking: Reported on 03/26/2023)   [DISCONTINUED] predniSONE (DELTASONE) 20 MG  tablet 2 tabs poqday 1-3, 1 tabs poqday 4-6, take in morning with food (Patient not taking: Reported on 03/26/2023)   No facility-administered encounter medications on file as of 04/12/2023.    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/2001   Past Surgical History:  Procedure Laterality  Date   ABDOMINAL HYSTERECTOMY     BREAST BIOPSY Right 11/03/2005   core biopsy w/ clip placement   CATARACT EXTRACTION W/PHACO Right 09/15/2020   Procedure: CATARACT EXTRACTION PHACO AND INTRAOCULAR LENS PLACEMENT (IOC) RIGHT;  Surgeon: Lockie Mola, MD;  Location[facility];  Service: Ophthalmology;  Laterality: Right;  4.70 0:57.4 8.2%   CATARACT EXTRACTION W/PHACO Left 09/29/2020   Procedure: CATARACT EXTRACTION PHACO AND INTRAOCULAR LENS PLACEMENT (IOC) LEFT 3.01 00:45.7 6.6%;  Surgeon: Lockie Mola, MD;  Location: [facility];  Service: Ophthalmology;  Laterality: Left;   CHOLECYSTECTOMY     COLONOSCOPY     COLONOSCOPY WITH PROPOFOL N/A 11/05/2015   Procedure: COLONOSCOPY WITH PROPOFOL;  Surgeon: Christena Deem, MD;  Location: [facility];  Service: Endoscopy;  Laterality: N/A;   COLONOSCOPY WITH PROPOFOL N/A 04/21/2022   Procedure: COLONOSCOPY WITH PROPOFOL;  Surgeon: Jaynie Collins, DO;  Location: [facility];  Service: Gastroenterology;  Laterality: N/A;   ESOPHAGOGASTRODUODENOSCOPY (EGD) WITH PROPOFOL N/A 04/21/2022   Procedure: ESOPHAGOGASTRODUODENOSCOPY (EGD) WITH PROPOFOL;  Surgeon: Jaynie Collins, DO;  Location: [facility];  Service: Gastroenterology;  Laterality: N/A;   JOINT REPLACEMENT  04-09-2018   KNEE ARTHROSCOPY Right 02/21/2008   TOTAL HIP ARTHROPLASTY Right 04/09/2018   Procedure: TOTAL HIP ARTHROPLASTY ANTERIOR APPROACH;  Surgeon: Kennedy Bucker, MD;  Location: [facility];  Service: Orthopedics;  Laterality: Right;   WISDOM TOOTH EXTRACTION     Family History  Problem Relation Age of Onset   Breast cancer Maternal Aunt        great   Kidney disease Mother    Diabetes Mother    Hypertension Father    Kidney disease Father    Diabetes Brother    Diabetes Paternal Grandmother    Social History   Socioeconomic History   Marital status: Divorced    Spouse name: Not on file   Number of children: 2    Years of education: 12   Highest education level: 12th grade  Occupational History   Not on file  Tobacco Use   Smoking status: Never   Smokeless tobacco: Never  Vaping Use   Vaping status: Never Used  Substance and Sexual Activity   Alcohol use: Yes    Comment: rarely   Drug use: No   Sexual activity: Not Currently  Other Topics Concern   Not on file  Social History Narrative   Pt lives alone   Social Determinants of Health   Financial Resource Strain: Low Risk  (04/12/2023)   Overall Financial Resource Strain (CARDIA)    Difficulty of Paying Living Expenses: Not hard at all  Food Insecurity: No Food Insecurity (04/12/2023)   Hunger Vital Sign    Worried About Running Out of Food in the Last Year: Never true    Ran Out of Food in the Last Year: Never true  Transportation Needs: No Transportation Needs (04/12/2023)   PRAPARE - Administrator, Civil Service (Medical): No    Lack of Transportation (Non-Medical): No  Physical Activity: Insufficiently Active (04/12/2023)   Exercise Vital Sign    Days of Exercise per Week: 2 days    Minutes of Exercise per Session: 60 min  Stress: No Stress Concern Present (04/12/2023)   [employer] of Occupational Health - Occupational Stress Questionnaire    Feeling of Stress : Not at all  Social Connections: Moderately Isolated (04/12/2023)   Social Connection and Isolation Panel [NHANES]    Frequency of Communication with Friends and Family: More than three times a week    Frequency of Social Gatherings with Friends and Family: More than three times a week    Attends Religious Services: More than 4 times per year    Active Member of [employer] or Organizations: No    Attends Engineer, structural: Never    Marital Status: Divorced    Tobacco Counseling Counseling given: Not Answered   Clinical Intake:  Pre-visit preparation completed: Yes  Pain : No/denies pain     BMI - recorded: 34 Nutritional Status: BMI >  30  Obese Nutritional Risks: None Diabetes: No  How often do you need to have someone help you when you read instructions, pamphlets, or other written materials from your doctor or pharmacy?: 1 - Never  Interpreter Needed?: No  Information entered by :: Kennedy Bucker, LPN   Activities of Daily Living    04/12/2023   10:19 AM 03/26/2023    9:52 AM  In your present state of health, do you have any difficulty performing the following activities:  Hearing? 0 0  Vision? 0 0  Difficulty concentrating or making decisions? 0 0  Walking or climbing stairs? 0 1  Dressing or bathing? 0 0  Doing errands, shopping? 0 0  Preparing Food and eating ? N   Using the Toilet? N   In the past six months, have you accidently leaked urine? N   Do you have problems with loss of bowel control? N   Managing your Medications? N   Managing your Finances? N   Housekeeping or managing your Housekeeping? N     Patient Care Team: Berniece Salines, FNP as PCP - General (Nurse Practitioner) Gwyneth Revels, DPM as Referring Physician (Podiatry) Morene Crocker, MD as Referring Physician (Neurology) Lockie Mola, MD as Referring Physician (Ophthalmology)  Indicate any recent Medical Services you may have received from other than [facility] providers in the past year (date may be approximate).     Assessment:   This is a routine wellness examination for Patricia Richardson.  Hearing/Vision screen Hearing Screening - Comments:: No aids Vision Screening - Comments:: Wears glasses- [facility] Dr.Brasington   Goals Addressed             This Visit's Progress    DIET - EAT MORE FRUITS AND VEGETABLES         Depression Screen    04/12/2023   10:16 AM 03/26/2023    9:52 AM 01/24/2023   10:43 AM 01/09/2023    8:12 AM 11/23/2022   10:12 AM 09/19/2022   12:58 PM 04/06/2022    8:39 AM  PHQ 2/9 Scores  PHQ - 2 Score 0 0 0 0 0 0 0  PHQ- 9 Score 0  0 0 0 0     Fall Risk    04/12/2023   10:19 AM 03/26/2023  9:52 AM 01/24/2023   10:43 AM 01/09/2023    8:12 AM 11/23/2022   10:12 AM  Fall Risk   Falls in the past year? 0 0 0 0 0  Number falls in past yr: 0 0 0 0 0  Injury with Fall? 0 0 0 0 0  Risk for fall due to : No Fall Risks No Fall Risks No Fall Risks No Fall Risks No Fall Risks  Follow up Falls prevention discussed;Falls evaluation completed Falls prevention discussed Falls prevention discussed;Education provided;Falls evaluation completed Falls prevention discussed;Education provided;Falls evaluation completed Falls prevention discussed    MEDICARE RISK AT HOME: Medicare Risk at Home Any stairs in or around the home?: No If so, are there any without handrails?: No Home free of loose throw rugs in walkways, pet beds, electrical cords, etc?: Yes Adequate lighting in your home to reduce risk of falls?: Yes Life alert?: No Use of a cane, walker or w/c?: No Grab bars in the bathroom?: Yes Shower chair or bench in shower?: Yes Elevated toilet seat or a handicapped toilet?: Yes  TIMED UP AND GO:  Was the test performed?  No    Cognitive Function:        04/12/2023   10:20 AM 04/06/2022    8:40 AM 03/04/2019    8:28 AM  6CIT Screen  What Year? 0 points 0 points 0 points  What month? 0 points 0 points 0 points  What time? 0 points 0 points 0 points  Count back from 20 0 points 0 points 0 points  Months in reverse 0 points 0 points 0 points  Repeat phrase 0 points  0 points  Total Score 0 points  0 points    Immunizations Immunization History  Administered Date(s) Administered   Fluad Quad(high Dose 65+) 02/06/2019, 02/17/2020, 03/14/2021, 02/13/2022   Fluad Trivalent(High Dose 65+) 03/26/2023   Influenza Split 03/01/2015   Influenza, High Dose Seasonal PF 04/10/2018   Influenza-Unspecified 03/06/2014, 03/06/2016, 03/13/2017   PFIZER(Purple Top)SARS-COV-2 Vaccination 07/31/2019, 08/26/2019   Pneumococcal Conjugate-13 02/06/2019   Pneumococcal Polysaccharide-23 02/25/2014,  03/13/2017   Tdap 03/01/2015   Zoster, Live 11/04/2015    TDAP status: Up to date  Flu Vaccine status: Up to date  Pneumococcal vaccine status: Up to date  Covid-19 vaccine status: Completed vaccines  Qualifies for Shingles Vaccine? Yes   Zostavax completed Yes   Shingrix Completed?: No.    Education has been provided regarding the importance of this vaccine. Patient has been advised to call insurance company to determine out of pocket expense if they have not yet received this vaccine. Advised may also receive vaccine at local pharmacy or Health Dept. Verbalized acceptance and understanding.  Screening Tests Health Maintenance  Topic Date Due   Zoster Vaccines- Shingrix (1 of 2) 06[birth year]26/1967   COVID-19 Vaccine (3 - Pfizer risk series) 09/23/2019   MAMMOGRAM  04/19/2023   DEXA SCAN  03/11/2024   Medicare Annual Wellness (AWV)  04/11/2024   DTaP/Tdap/Td (2 - Td or Tdap) 02/28/2025   Colonoscopy  04/21/2025   Pneumonia Vaccine 22+ Years old  Completed   INFLUENZA VACCINE  Completed   Hepatitis C Screening  Completed   HPV VACCINES  Aged Out  Had [facility] at [facility] [place] last year  Health Maintenance  Health Maintenance Due  Topic Date Due   Zoster Vaccines- Shingrix (1 of 2) 06[birth year]26/1969   COVID-19 Vaccine (3 - Pfizer risk series) 09/23/2019    Colorectal cancer screening: Type of screening:  Colonoscopy. Completed 04/21/22. Repeat every 1 years  Mammogram status: Completed SCHEDULED 04/20/23. Repeat every year  Bone Density status: Completed 03/12/19. Results reflect: Bone density results: NORMAL. Repeat every 5 years.  Lung Cancer Screening: (Low Dose CT Chest recommended if Age 22-80 years, 20 pack-year currently smoking OR have quit w/in 15years.) does not qualify.    Additional Screening:  Hepatitis C Screening: does qualify; Completed 11/04/18  Vision Screening: Recommended annual ophthalmology exams for early detection of glaucoma and other disorders of the  eye. Is the patient up to date with their annual eye exam?  Yes  Who is the provider or what is the name of the office in which the patient attends annual eye exams? Dr.Brasington If pt is not established with a provider, would they like to be referred to a provider to establish care? No .   Dental Screening: Recommended annual dental exams for proper oral hygiene   Community Resource Referral / Chronic Care Management: CRR required this visit?  No   CCM required this visit?  No     Plan:     I have personally reviewed and noted the following in the patient's chart:   Medical and social history Use of alcohol, tobacco or illicit drugs  Current medications and supplements including opioid prescriptions. Patient is not currently taking opioid prescriptions. Functional ability and status Nutritional status Physical activity Advanced directives List of other physicians Hospitalizations, surgeries, and ER visits in previous 12 months Vitals Screenings to include cognitive, depression, and falls Referrals and appointments  In addition, I have reviewed and discussed with patient certain preventive protocols, quality metrics, and best practice recommendations. A written personalized care plan for preventive services as well as general preventive health recommendations were provided to patient.     Hal Hope, LPN   16/06/958   After Visit Summary: (MyChart) Due to this being a telephonic visit, the after visit summary with patients personalized plan was offered to patient via MyChart   Nurse Notes: none

## 2023-04-12 NOTE — Patient Instructions (Addendum)
Patricia Richardson , Thank you for taking time to come for your Medicare Wellness Visit. I appreciate your ongoing commitment to your health goals. Please review the following plan we discussed and let me know if I can assist you in the future.   Referrals/Orders/Follow-Ups/Clinician Recommendations: none- referral for gastroenterology sent  This is a list of the screening recommended for you and due dates:  Health Maintenance  Topic Date Due   Zoster (Shingles) Vaccine (1 of 2) 06/26/1967   COVID-19 Vaccine (3 - Pfizer risk series) 09/23/2019   Mammogram  04/19/2023   DEXA scan (bone density measurement)  03/11/2024   Medicare Annual Wellness Visit  04/11/2024   DTaP/Tdap/Td vaccine (2 - Td or Tdap) 02/28/2025   Colon Cancer Screening  04/21/2025   Pneumonia Vaccine  Completed   Flu Shot  Completed   Hepatitis C Screening  Completed   HPV Vaccine  Aged Out    Advanced directives: (ACP Link)Information on Advanced Care Planning can be found at [facility] Advance Health Care Directives Advance Health Care Directives ([email or web address]    Next Medicare Annual Wellness Visit scheduled for next year: Yes    04/17/24 @ 10:55 am by video

## 2023-04-20 ENCOUNTER — Ambulatory Visit
Admission: RE | Admit: 2023-04-20 | Discharge: 2023-04-20 | Disposition: A | Discharge status: Home / Self Care | Payer: Medicare Other | Source: Ambulatory Visit | Attending: Nurse Practitioner | Admitting: Nurse Practitioner

## 2023-04-20 DIAGNOSIS — Z1231 Encounter for screening mammogram for malignant neoplasm of breast: Secondary | ICD-10-CM | POA: Diagnosis not present

## 2023-04-30 ENCOUNTER — Ambulatory Visit (INDEPENDENT_AMBULATORY_CARE_PROVIDER_SITE_OTHER): Payer: Medicare Other

## 2023-04-30 ENCOUNTER — Ambulatory Visit: Payer: Medicare Other

## 2023-04-30 DIAGNOSIS — E538 Deficiency of other specified B group vitamins: Secondary | ICD-10-CM | POA: Diagnosis not present

## 2023-04-30 MED ORDER — CYANOCOBALAMIN 1000 MCG/ML IJ SOLN
1000.0000 ug | Freq: Once | INTRAMUSCULAR | Status: AC
  Administered 2023-04-30: 1000 ug via INTRAMUSCULAR

## 2023-05-28 ENCOUNTER — Ambulatory Visit (INDEPENDENT_AMBULATORY_CARE_PROVIDER_SITE_OTHER): Payer: Medicare Other

## 2023-05-28 DIAGNOSIS — E538 Deficiency of other specified B group vitamins: Secondary | ICD-10-CM

## 2023-05-28 MED ORDER — CYANOCOBALAMIN 1000 MCG/ML IJ SOLN
1000.0000 ug | Freq: Once | INTRAMUSCULAR | Status: AC
  Administered 2023-05-28: 1000 ug via INTRAMUSCULAR

## 2023-06-20 ENCOUNTER — Other Ambulatory Visit: Payer: Self-pay | Admitting: Nurse Practitioner

## 2023-06-20 DIAGNOSIS — R609 Edema, unspecified: Secondary | ICD-10-CM

## 2023-06-20 DIAGNOSIS — Z8673 Personal history of transient ischemic attack (TIA), and cerebral infarction without residual deficits: Secondary | ICD-10-CM

## 2023-06-20 DIAGNOSIS — I10 Essential (primary) hypertension: Secondary | ICD-10-CM

## 2023-06-21 ENCOUNTER — Telehealth: Payer: Self-pay | Admitting: Nurse Practitioner

## 2023-06-21 NOTE — Telephone Encounter (Signed)
Requested Prescriptions  Pending Prescriptions Disp Refills   atorvastatin (LIPITOR) 10 MG tablet [Pharmacy Med Name: Atorvastatin Calcium 10 MG Oral Tablet] 29 tablet 2    Sig: TAKE 1 TABLET BY MOUTH AT  BEDTIME 2 NIGHTS WEEKLY     Cardiovascular:  Antilipid - Statins Failed - 06/21/2023 12:25 PM      Failed - Lipid Panel in normal range within the last 12 months    Cholesterol  Date Value Ref Range Status  09/19/2022 150 <200 mg/dL Final   LDL Cholesterol (Calc)  Date Value Ref Range Status  09/19/2022 69 mg/dL (calc) Final    Comment:    Reference range: <100 . Desirable range <100 mg/dL for primary prevention;   <70 mg/dL for patients with CHD or diabetic patients  with > or = 2 CHD risk factors. Marland Kitchen LDL-C is now calculated using the Martin-Hopkins  calculation, which is a validated novel method providing  better accuracy than the Friedewald equation in the  estimation of LDL-C.  Horald Pollen et al. Lenox Ahr. 7829;562(13): 2061-2068  (http://education.[employer].com/faq/FAQ164)    HDL  Date Value Ref Range Status  09/19/2022 60 > OR = 50 mg/dL Final   Triglycerides  Date Value Ref Range Status  09/19/2022 128 <150 mg/dL Final         Passed - Patient is not pregnant      Passed - Valid encounter within last 12 months    Recent Outpatient Visits           2 months ago Annual physical exam   [facility] Berniece Salines, FNP   4 months ago COVID-19   [facility] Berniece Salines, FNP   5 months ago Cough, unspecified type   [facility] Danelle Berry, PA-C   7 months ago Antibiotic-induced yeast infection   [facility] Alba Cory, MD   9 months ago B12 deficiency   [facility] Berniece Salines, FNP       Future Appointments             In 3 months Zane Herald, Rudolpho Sevin, FNP [facility]              hydrochlorothiazide (HYDRODIURIL) 25 MG tablet [Pharmacy Med Name: hydroCHLOROthiazide 25 MG Oral Tablet] 100 tablet 0    Sig: TAKE 1 TABLET BY MOUTH DAILY     Cardiovascular: Diuretics - Thiazide Passed - 06/21/2023 12:25 PM      Passed - Cr in normal range and within 180 days    Creat  Date Value Ref Range Status  01/24/2023 0.79 0.60 - 1.00 mg/dL Final         Passed - K in normal range and within 180 days    Potassium  Date Value Ref Range Status  01/24/2023 3.6 3.5 - 5.3 mmol/L Final         Passed - Na in normal range and within 180 days    Sodium  Date Value Ref Range Status  01/24/2023 140 135 - 146 mmol/L Final         Passed - Last BP in normal range    BP Readings from Last 1 Encounters:  03/26/23 122/84         Passed - Valid encounter within last 6 months    Recent Outpatient Visits           2 months ago  Annual physical exam   [facility] Berniece Salines, FNP   4 months ago COVID-19   [facility] Berniece Salines, FNP   5 months ago Cough, unspecified type   [facility] Danelle Berry, [place]   7 months ago Antibiotic-induced yeast infection   [facility] Alba Cory, MD   9 months ago B12 deficiency   [facility] Berniece Salines, FNP       Future Appointments             In 3 months Zane Herald, Rudolpho Sevin, FNP [facility], [facility]

## 2023-06-21 NOTE — Telephone Encounter (Signed)
Pt notified its not recommended but up to her we are unsure if it will effect ins.?

## 2023-06-21 NOTE — Telephone Encounter (Signed)
Copied from CRM [id number]. Topic: Appointment Scheduling - Scheduling Inquiry for Clinic >> Jun 21, 2023  8:30 AM Franchot Heidelberg wrote: Reason for CRM: Pt wants to reschedule her B12 injection from next week to today because it is going to be so cold she says. Please advise  Best contact:  [phone]

## 2023-06-21 NOTE — Telephone Encounter (Signed)
Is this okay? Will it be to soon?

## 2023-06-25 DIAGNOSIS — R0602 Shortness of breath: Secondary | ICD-10-CM | POA: Diagnosis not present

## 2023-06-25 DIAGNOSIS — R600 Localized edema: Secondary | ICD-10-CM | POA: Diagnosis not present

## 2023-06-26 ENCOUNTER — Ambulatory Visit (INDEPENDENT_AMBULATORY_CARE_PROVIDER_SITE_OTHER): Payer: Medicare Other

## 2023-06-26 DIAGNOSIS — E538 Deficiency of other specified B group vitamins: Secondary | ICD-10-CM

## 2023-06-26 MED ORDER — CYANOCOBALAMIN 1000 MCG/ML IJ SOLN
1000.0000 ug | Freq: Once | INTRAMUSCULAR | Status: AC
  Administered 2023-06-26: 1000 ug via INTRAMUSCULAR

## 2023-06-26 NOTE — Progress Notes (Signed)
Patient is in office today for a nurse visit for B12 Injection. Patient Injection was given in the  Left deltoid. Patient tolerated injection well.

## 2023-07-10 ENCOUNTER — Other Ambulatory Visit: Payer: Self-pay | Admitting: Nurse Practitioner

## 2023-07-10 DIAGNOSIS — Z860101 Personal history of adenomatous and serrated colon polyps: Secondary | ICD-10-CM | POA: Diagnosis not present

## 2023-07-10 DIAGNOSIS — K224 Dyskinesia of esophagus: Secondary | ICD-10-CM | POA: Diagnosis not present

## 2023-07-10 DIAGNOSIS — I10 Essential (primary) hypertension: Secondary | ICD-10-CM

## 2023-07-10 DIAGNOSIS — K219 Gastro-esophageal reflux disease without esophagitis: Secondary | ICD-10-CM | POA: Diagnosis not present

## 2023-07-10 DIAGNOSIS — K5790 Diverticulosis of intestine, part unspecified, without perforation or abscess without bleeding: Secondary | ICD-10-CM | POA: Diagnosis not present

## 2023-07-12 NOTE — Telephone Encounter (Signed)
 Requested Prescriptions  Pending Prescriptions Disp Refills   amLODipine  (NORVASC ) 10 MG tablet [Pharmacy Med Name: amLODIPine  Besylate 10 MG Oral Tablet] 100 tablet 1    Sig: TAKE 1 TABLET BY MOUTH DAILY     Cardiovascular: Calcium  Channel Blockers 2 Passed - 07/12/2023 11:29 AM      Passed - Last BP in normal range    BP Readings from Last 1 Encounters:  03/26/23 122/84         Passed - Last Heart Rate in normal range    Pulse Readings from Last 1 Encounters:  03/26/23 86         Passed - Valid encounter within last 6 months    Recent Outpatient Visits           3 months ago Annual physical exam   [facility] Gareth Mliss FALCON, FNP   5 months ago COVID-19   [facility] Gareth Mliss FALCON, FNP   6 months ago Cough, unspecified type   [facility] Leavy Mole, PA-C   7 months ago Antibiotic-induced yeast infection   [facility] Glenard Mire, MD   9 months ago B12 deficiency   [facility] Gareth Mliss FALCON, FNP       Future Appointments             In 2 months Gareth, Mliss FALCON, FNP [facility], [facility]

## 2023-07-20 DIAGNOSIS — Z961 Presence of intraocular lens: Secondary | ICD-10-CM | POA: Diagnosis not present

## 2023-07-20 DIAGNOSIS — H40003 Preglaucoma, unspecified, bilateral: Secondary | ICD-10-CM | POA: Diagnosis not present

## 2023-07-20 DIAGNOSIS — H26491 Other secondary cataract, right eye: Secondary | ICD-10-CM | POA: Diagnosis not present

## 2023-07-23 DIAGNOSIS — H26491 Other secondary cataract, right eye: Secondary | ICD-10-CM | POA: Diagnosis not present

## 2023-07-26 ENCOUNTER — Ambulatory Visit: Payer: Medicare Other

## 2023-07-30 ENCOUNTER — Ambulatory Visit (INDEPENDENT_AMBULATORY_CARE_PROVIDER_SITE_OTHER): Payer: Medicare Other

## 2023-07-30 DIAGNOSIS — E538 Deficiency of other specified B group vitamins: Secondary | ICD-10-CM | POA: Diagnosis not present

## 2023-07-30 MED ORDER — CYANOCOBALAMIN 1000 MCG/ML IJ SOLN
1000.0000 ug | Freq: Once | INTRAMUSCULAR | Status: AC
  Administered 2023-07-30: 1000 ug via INTRAMUSCULAR

## 2023-07-30 NOTE — Progress Notes (Signed)
 Patient is in office today for a nurse visit for B12 Injection. Patient Injection was given in the  Left deltoid. Patient tolerated injection well.

## 2023-08-23 ENCOUNTER — Ambulatory Visit
Admission: RE | Admit: 2023-08-23 | Discharge: 2023-08-23 | Disposition: A | Discharge status: Home / Self Care | Attending: Nurse Practitioner | Admitting: Nurse Practitioner

## 2023-08-23 ENCOUNTER — Ambulatory Visit (INDEPENDENT_AMBULATORY_CARE_PROVIDER_SITE_OTHER): Admitting: Nurse Practitioner

## 2023-08-23 ENCOUNTER — Ambulatory Visit
Admission: RE | Admit: 2023-08-23 | Discharge: 2023-08-23 | Disposition: A | Discharge status: Home / Self Care | Source: Ambulatory Visit | Attending: Nurse Practitioner | Admitting: Nurse Practitioner

## 2023-08-23 ENCOUNTER — Encounter: Payer: Self-pay | Admitting: Nurse Practitioner

## 2023-08-23 VITALS — BP 126/78 | HR 91 | Temp 97.7°F | Resp 18 | Ht 62.0 in | Wt 181.1 lb

## 2023-08-23 DIAGNOSIS — R0602 Shortness of breath: Secondary | ICD-10-CM

## 2023-08-23 DIAGNOSIS — R600 Localized edema: Secondary | ICD-10-CM

## 2023-08-23 NOTE — Progress Notes (Signed)
 BP 126/78   Pulse 91   Temp 97.7 F (36.5 C)   Resp 18   Ht 5\' 2"  (1.575 m)   Wt 181 lb 1.6 oz (82.1 kg)   SpO2 96%   BMI 33.12 kg/m    Subjective:    Patient ID: Patricia Richardson, female    DOB: [birth year], 73 y.o.   MRN: [id number]  HPI: Patricia Richardson is a 73 y.o. female  Chief Complaint  Patient presents with   Edema    Bilateral legs and feet and SOB    Discussed the use of AI scribe software for clinical note transcription with the patient, who gave verbal consent to proceed.  History of Present Illness Patricia Richardson is a 73 year old female who presents with swelling of the feet and legs after starting amantadine.  She has experienced swelling in her feet and legs after starting amantadine, which she took for about a month. The swelling was significant, making it difficult for her to wear shoes and walk, and she described her feet as 'super swollen' with an inability to stretch her toes apart. After discontinuing the medication, the swelling has reduced but persists to some extent.  She experiences chronic shortness of breath that has been ongoing for months. She needs to rest frequently, even when walking short distances, and has placed chairs outside to accommodate this need. No history of heart problems.  She occasionally experiences chest pain, described as occurring 'once in a while,' but does not have a cardiologist. The pain is located in her upper chest and neck.  She denies currently having chest pain.   Due to her symptoms, she no longer goes shopping with her children.       04/12/2023   10:16 AM 03/26/2023    9:52 AM 01/24/2023   10:43 AM  Depression screen PHQ 2/9  Decreased Interest 0 0 0  Down, Depressed, Hopeless 0 0 0  PHQ - 2 Score 0 0 0  Altered sleeping 0  0  Tired, decreased energy 0  0  Change in appetite 0  0  Feeling bad or failure about yourself  0  0  Trouble concentrating 0  0  Moving slowly or fidgety/restless 0  0  Suicidal thoughts 0   0  PHQ-9 Score 0  0  Difficult doing work/chores Not difficult at all  Not difficult at all    Relevant past medical, surgical, family and social history reviewed and updated as indicated. Interim medical history since our last visit reviewed. Allergies and medications reviewed and updated.  Review of Systems  Ten systems reviewed and is negative except as mentioned in HPI      Objective:    BP 126/78   Pulse 91   Temp 97.7 F (36.5 C)   Resp 18   Ht 5\' 2"  (1.575 m)   Wt 181 lb 1.6 oz (82.1 kg)   SpO2 96%   BMI 33.12 kg/m    Wt Readings from Last 3 Encounters:  08/23/23 181 lb 1.6 oz (82.1 kg)  03/26/23 189 lb 11.2 oz (86 kg)  01/24/23 180 lb 8 oz (81.9 kg)    Physical Exam Vitals reviewed.  Constitutional:      Appearance: Normal appearance.  HENT:     Head: Normocephalic.  Cardiovascular:     Rate and Rhythm: Normal rate and regular rhythm.     Pulses: Normal pulses.     Heart sounds: Normal heart sounds.  Pulmonary:     Effort: Pulmonary effort is normal.     Breath sounds: Normal breath sounds.  Musculoskeletal:     Right lower leg: Edema present.     Left lower leg: Edema present.  Neurological:     General: No focal deficit present.     Mental Status: She is alert and oriented to person, place, and time. Mental status is at baseline.  Psychiatric:        Mood and Affect: Mood normal.        Behavior: Behavior normal.        Thought Content: Thought content normal.        Judgment: Judgment normal.     Results for orders placed or performed in visit on 01/24/23  CBC with Differential/Platelet   Collection Time: 01/24/23 11:12 AM  Result Value Ref Range   WBC 9.6 3.8 - 10.8 Thousand/uL   RBC 5.01 3.80 - 5.10 Million/uL   Hemoglobin 15.0 11.7 - 15.5 g/dL   HCT 81.1 91.4 - 78.2 %   MCV 89.4 80.0 - 100.0 fL   MCH 29.9 27.0 - 33.0 pg   MCHC 33.5 32.0 - 36.0 g/dL   RDW 95.6 21.3 - 08.6 %   Platelets 359 140 - 400 Thousand/uL   MPV 10.6 7.5 - 12.5  fL   Neutro Abs 7,584 1,500 - 7,800 cells/uL   Lymphs Abs 1,382 850 - 3,900 cells/uL   Absolute Monocytes 509 200 - 950 cells/uL   Eosinophils Absolute 86 15 - 500 cells/uL   Basophils Absolute 38 0 - 200 cells/uL   Neutrophils Relative % 79 %   Total Lymphocyte 14.4 %   Monocytes Relative 5.3 %   Eosinophils Relative 0.9 %   Basophils Relative 0.4 %  COMPLETE METABOLIC PANEL WITH GFR   Collection Time: 01/24/23 11:12 AM  Result Value Ref Range   Glucose, Bld 92 65 - 99 mg/dL   BUN 18 7 - 25 mg/dL   Creat 5.78 4.69 - 6.29 mg/dL   eGFR 78 > OR = 60 BM/WUX/3.24M0   BUN/Creatinine Ratio SEE NOTE: 6 - 22 (calc)   Sodium 140 135 - 146 mmol/L   Potassium 3.6 3.5 - 5.3 mmol/L   Chloride 102 98 - 110 mmol/L   CO2 26 20 - 32 mmol/L   Calcium 9.8 8.6 - 10.4 mg/dL   Total Protein 6.9 6.1 - 8.1 g/dL   Albumin 4.1 3.6 - 5.1 g/dL   Globulin 2.8 1.9 - 3.7 g/dL (calc)   AG Ratio 1.5 1.0 - 2.5 (calc)   Total Bilirubin 0.5 0.2 - 1.2 mg/dL   Alkaline phosphatase (APISO) 73 37 - 153 U/L   AST 12 10 - 35 U/L   ALT 4 (L) 6 - 29 U/L  TSH   Collection Time: 01/24/23 11:12 AM  Result Value Ref Range   TSH 0.55 0.40 - 4.50 mIU/L  VITAMIN D 25 Hydroxy (Vit-D Deficiency, Fractures)   Collection Time: 01/24/23 11:12 AM  Result Value Ref Range   Vit D, 25-Hydroxy 33 30 - 100 ng/mL  B12 and Folate Panel   Collection Time: 01/24/23 11:12 AM  Result Value Ref Range   Vitamin B-12 390 200 - 1,100 pg/mL   Folate 11.7 ng/mL  Iron, TIBC and Ferritin Panel   Collection Time: 01/24/23 11:12 AM  Result Value Ref Range   Iron 72 45 - 160 mcg/dL   TIBC 102 725 - 366 mcg/dL (calc)   %SAT 25 16 -  45 % (calc)   Ferritin 85 16 - 288 ng/mL       Assessment & Plan:   Problem List Items Addressed This Visit   None Visit Diagnoses       Shortness of breath    -  Primary   Relevant Orders   EKG 12-Lead   DG Chest 2 View   CBC with Differential/Platelet   COMPLETE METABOLIC PANEL WITH GFR   Brain  natriuretic peptide   Troponin I     Bilateral lower extremity edema       Relevant Orders   COMPLETE METABOLIC PANEL WITH GFR   Brain natriuretic peptide       Assessment and Plan Assessment & Plan Peripheral Edema Bilateral swelling of feet and legs, noted after starting amantadine. Swelling was severe enough to affect shoe fitting and ambulation. Swelling improved after discontinuation of amantadine but persists mildly. Differential diagnosis includes medication-induced edema, heart failure, or vascular issues. Medical decision making involves determining the underlying cause through diagnostic tests to decide on the need for cardiology or vascular referral. - Order EKG, blood work, and chest x-ray - Evaluate results to determine need for cardiology or vascular referral  Dyspnea Chronic dyspnea for several months, exacerbated by minimal exertion. No prior cardiac history. Differential diagnosis includes cardiac or pulmonary causes. Medical decision making involves using diagnostic tests to assess cardiac function and determine the need for cardiology referral. - Order EKG, blood work, and chest x-ray - Evaluate results to determine need for cardiology referral  Intermittent Chest Pain Occasional chest pain reported, with no clear pattern or associated symptoms. Differential diagnosis includes cardiac or musculoskeletal causes. Medical decision making involves using diagnostic tests to assess cardiac function and determine the need for cardiology referral. - Order EKG, blood work, and chest x-ray - Evaluate results to determine need for cardiology referral  Follow-up Plan to review diagnostic results and determine further management steps. Medical decision making involves contacting her with results and discussing further management based on findings. - Review EKG, blood work, and chest x-ray results - Contact her with results and further management plan   Follow up plan: Return if  symptoms worsen or fail to improve.

## 2023-08-24 ENCOUNTER — Other Ambulatory Visit: Payer: Self-pay | Admitting: Nurse Practitioner

## 2023-08-24 ENCOUNTER — Encounter: Payer: Self-pay | Admitting: Nurse Practitioner

## 2023-08-24 DIAGNOSIS — R0602 Shortness of breath: Secondary | ICD-10-CM

## 2023-08-24 DIAGNOSIS — R600 Localized edema: Secondary | ICD-10-CM

## 2023-08-24 DIAGNOSIS — R079 Chest pain, unspecified: Secondary | ICD-10-CM

## 2023-08-24 LAB — COMPLETE METABOLIC PANEL WITH GFR
AG Ratio: 2 (calc) (ref 1.0–2.5)
ALT: 14 U/L (ref 6–29)
AST: 13 U/L (ref 10–35)
Albumin: 4.7 g/dL (ref 3.6–5.1)
Alkaline phosphatase (APISO): 82 U/L (ref 37–153)
BUN: 13 mg/dL (ref 7–25)
CO2: 25 mmol/L (ref 20–32)
Calcium: 9.9 mg/dL (ref 8.6–10.4)
Chloride: 103 mmol/L (ref 98–110)
Creat: 0.72 mg/dL (ref 0.60–1.00)
Globulin: 2.4 g/dL (ref 1.9–3.7)
Glucose, Bld: 88 mg/dL (ref 65–99)
Potassium: 3.9 mmol/L (ref 3.5–5.3)
Sodium: 141 mmol/L (ref 135–146)
Total Bilirubin: 0.6 mg/dL (ref 0.2–1.2)
Total Protein: 7.1 g/dL (ref 6.1–8.1)

## 2023-08-24 LAB — BRAIN NATRIURETIC PEPTIDE: Brain Natriuretic Peptide: 13 pg/mL (ref <100)

## 2023-08-24 LAB — CBC WITH DIFFERENTIAL/PLATELET
Absolute Lymphocytes: 1087 {cells}/uL (ref 850–3900)
Absolute Monocytes: 281 {cells}/uL (ref 200–950)
Basophils Absolute: 30 {cells}/uL (ref 0–200)
Basophils Relative: 0.4 %
Eosinophils Absolute: 61 {cells}/uL (ref 15–500)
Eosinophils Relative: 0.8 %
HCT: 45.8 % — ABNORMAL HIGH (ref 35.0–45.0)
Hemoglobin: 15.4 g/dL (ref 11.7–15.5)
MCH: 29.9 pg (ref 27.0–33.0)
MCHC: 33.6 g/dL (ref 32.0–36.0)
MCV: 88.9 fL (ref 80.0–100.0)
MPV: 10.4 fL (ref 7.5–12.5)
Monocytes Relative: 3.7 %
Neutro Abs: 6141 {cells}/uL (ref 1500–7800)
Neutrophils Relative %: 80.8 %
Platelets: 362 10*3/uL (ref 140–400)
RBC: 5.15 10*6/uL — ABNORMAL HIGH (ref 3.80–5.10)
RDW: 12.8 % (ref 11.0–15.0)
Total Lymphocyte: 14.3 %
WBC: 7.6 10*3/uL (ref 3.8–10.8)

## 2023-08-24 LAB — TROPONIN I: Troponin I: <3 ng/L (ref <=47)

## 2023-08-28 ENCOUNTER — Ambulatory Visit: Payer: Medicare Other

## 2023-08-28 DIAGNOSIS — E538 Deficiency of other specified B group vitamins: Secondary | ICD-10-CM

## 2023-08-28 MED ORDER — CYANOCOBALAMIN 1000 MCG/ML IJ SOLN
1000.0000 ug | Freq: Once | INTRAMUSCULAR | Status: AC
  Administered 2023-08-28: 1000 ug via INTRAMUSCULAR

## 2023-08-28 NOTE — Progress Notes (Signed)
 Patient is in office today for a nurse visit for B12 Injection. Patient Injection was given in the  Left deltoid. Patient tolerated injection well.

## 2023-08-29 ENCOUNTER — Other Ambulatory Visit: Payer: Self-pay | Admitting: Nurse Practitioner

## 2023-08-29 DIAGNOSIS — Z8673 Personal history of transient ischemic attack (TIA), and cerebral infarction without residual deficits: Secondary | ICD-10-CM

## 2023-08-29 DIAGNOSIS — I10 Essential (primary) hypertension: Secondary | ICD-10-CM

## 2023-08-29 DIAGNOSIS — R609 Edema, unspecified: Secondary | ICD-10-CM

## 2023-08-31 NOTE — Telephone Encounter (Signed)
 Requested Prescriptions  Pending Prescriptions Disp Refills   atorvastatin (LIPITOR) 10 MG tablet [Pharmacy Med Name: Atorvastatin Calcium 10 MG Oral Tablet] 29 tablet 2    Sig: TAKE 1 TABLET BY MOUTH AT  BEDTIME 2 NIGHTS WEEKLY     Cardiovascular:  Antilipid - Statins Failed - 08/31/2023  9:56 AM      Failed - Valid encounter within last 12 months    Recent Outpatient Visits           1 week ago Shortness of breath   [facility] Berniece Salines, FNP       Future Appointments             In 3 weeks Zane Herald Rudolpho Sevin, FNP [facility]   In 2 months Debbe Odea, MD [facility]            Failed - Lipid Panel in normal range within the last 12 months    Cholesterol  Date Value Ref Range Status  09/19/2022 150 <200 mg/dL Final   LDL Cholesterol (Calc)  Date Value Ref Range Status  09/19/2022 69 mg/dL (calc) Final    Comment:    Reference range: <100 . Desirable range <100 mg/dL for primary prevention;   <70 mg/dL for patients with CHD or diabetic patients  with > or = 2 CHD risk factors. Marland Kitchen LDL-C is now calculated using the Martin-Hopkins  calculation, which is a validated novel method providing  better accuracy than the Friedewald equation in the  estimation of LDL-C.  Horald Pollen et al. Lenox Ahr. 1610;960(45): 2061-2068  (http://education.[employer].com/faq/FAQ164)    HDL  Date Value Ref Range Status  09/19/2022 60 > OR = 50 mg/dL Final   Triglycerides  Date Value Ref Range Status  09/19/2022 128 <150 mg/dL Final         Passed - Patient is not pregnant       hydrochlorothiazide (HYDRODIURIL) 25 MG tablet [Pharmacy Med Name: hydroCHLOROthiazide 25 MG Oral Tablet] 100 tablet 1    Sig: TAKE 1 TABLET BY MOUTH DAILY     Cardiovascular: Diuretics - Thiazide Failed - 08/31/2023  9:56 AM      Failed - Valid encounter within last 6 months    Recent Outpatient Visits            1 week ago Shortness of breath   [facility] Berniece Salines, FNP       Future Appointments             In 3 weeks Berniece Salines, FNP [facility]   In 2 months Debbe Odea, MD [facility] - Cr in normal range and within 180 days    Creat  Date Value Ref Range Status  08/23/2023 0.72 0.60 - 1.00 mg/dL Final         Passed - K in normal range and within 180 days    Potassium  Date Value Ref Range Status  08/23/2023 3.9 3.5 - 5.3 mmol/L Final         Passed - Na in normal range and within 180 days    Sodium  Date Value Ref Range Status  08/23/2023 141 135 - 146 mmol/L Final         Passed - Last BP in normal range    BP Readings  from Last 1 Encounters:  08/23/23 126/78

## 2023-09-10 DIAGNOSIS — E0590 Thyrotoxicosis, unspecified without thyrotoxic crisis or storm: Secondary | ICD-10-CM | POA: Diagnosis not present

## 2023-09-17 DIAGNOSIS — E042 Nontoxic multinodular goiter: Secondary | ICD-10-CM | POA: Diagnosis not present

## 2023-09-17 DIAGNOSIS — E0590 Thyrotoxicosis, unspecified without thyrotoxic crisis or storm: Secondary | ICD-10-CM | POA: Diagnosis not present

## 2023-09-24 ENCOUNTER — Ambulatory Visit (INDEPENDENT_AMBULATORY_CARE_PROVIDER_SITE_OTHER): Payer: Self-pay | Admitting: Nurse Practitioner

## 2023-09-24 VITALS — BP 114/54 | HR 88 | Resp 18 | Ht 62.0 in | Wt 182.4 lb

## 2023-09-24 DIAGNOSIS — G20C Parkinsonism, unspecified: Secondary | ICD-10-CM | POA: Diagnosis not present

## 2023-09-24 DIAGNOSIS — E782 Mixed hyperlipidemia: Secondary | ICD-10-CM

## 2023-09-24 DIAGNOSIS — R7303 Prediabetes: Secondary | ICD-10-CM

## 2023-09-24 DIAGNOSIS — I10 Essential (primary) hypertension: Secondary | ICD-10-CM

## 2023-09-24 DIAGNOSIS — I739 Peripheral vascular disease, unspecified: Secondary | ICD-10-CM | POA: Diagnosis not present

## 2023-09-24 DIAGNOSIS — E538 Deficiency of other specified B group vitamins: Secondary | ICD-10-CM | POA: Diagnosis not present

## 2023-09-24 DIAGNOSIS — E669 Obesity, unspecified: Secondary | ICD-10-CM

## 2023-09-24 DIAGNOSIS — Z8673 Personal history of transient ischemic attack (TIA), and cerebral infarction without residual deficits: Secondary | ICD-10-CM | POA: Diagnosis not present

## 2023-09-24 DIAGNOSIS — K219 Gastro-esophageal reflux disease without esophagitis: Secondary | ICD-10-CM

## 2023-09-24 DIAGNOSIS — R609 Edema, unspecified: Secondary | ICD-10-CM | POA: Diagnosis not present

## 2023-09-24 MED ORDER — CYANOCOBALAMIN 1000 MCG/ML IJ SOLN
1000.0000 ug | Freq: Once | INTRAMUSCULAR | Status: AC
  Administered 2023-09-24: 1000 ug via INTRAMUSCULAR

## 2023-09-24 MED ORDER — HYDROCHLOROTHIAZIDE 25 MG PO TABS: 25.0000 mg | ORAL_TABLET | Freq: Every day | ORAL | 1 refills | Status: DC

## 2023-09-24 NOTE — Progress Notes (Signed)
 BP (!) 114/54   Pulse 88   Resp 18   Ht 5\' 2"  (1.575 m)   Wt 182 lb 6.4 oz (82.7 kg)   SpO2 100%   BMI 33.36 kg/m    Subjective:    Patient ID: Patricia Richardson, female    DOB: 12/07/1950, 73 y.o.   MRN: [id number]  HPI: Patricia Richardson is a 73 y.o. female  Chief Complaint  Patient presents with   Follow-up    Discussed the use of AI scribe software for clinical note transcription with the patient, who gave verbal consent to proceed.  History of Present Illness Patricia Richardson is a 73 year old female with hypertension and peripheral artery disease who presents with shortness of breath and peripheral edema, and routine follow up.   She experiences persistent shortness of breath and peripheral edema, particularly noticeable during ambulation. Previous evaluations, including an EKG, blood work, and chest x-ray, were normal. She is awaiting a cardiology appointment scheduled for late June and is on a cancellation list for an earlier appointment.  She has a history of hypertension and is currently taking hydrochlorothiazide  25 mg daily. She requested a refill of her blood pressure medication during the visit.  She has Parkinsonism and was last seen by neurology in October 2024 for tremors. Her tremors have not worsened, and she stopped a medication prescribed by neurology due to swelling in her feet and legs, which improved after discontinuation. She is currently taking carbidopa-levodopa 25/100 mg three times daily and gabapentin  400 mg four times a day.  She is followed by endocrinology for subclinical hyperthyroidism and multinodular goiter, with the last TSH in normal range as of October 2024. She takes Tapazole 5 mg daily.  She has a history of B12 deficiency and is scheduled to receive a B12 injection. She also takes omeprazole  20 mg daily for GERD, atorvastatin  10 mg twice a week for hyperlipidemia, and meloxicam  15 mg daily.         04/12/2023   10:16 AM 03/26/2023    9:52 AM  01/24/2023   10:43 AM  Depression screen PHQ 2/9  Decreased Interest 0 0 0  Down, Depressed, Hopeless 0 0 0  PHQ - 2 Score 0 0 0  Altered sleeping 0  0  Tired, decreased energy 0  0  Change in appetite 0  0  Feeling bad or failure about yourself  0  0  Trouble concentrating 0  0  Moving slowly or fidgety/restless 0  0  Suicidal thoughts 0  0  PHQ-9 Score 0  0  Difficult doing work/chores Not difficult at all  Not difficult at all    Relevant past medical, surgical, family and social history reviewed and updated as indicated. Interim medical history since our last visit reviewed. Allergies and medications reviewed and updated.  Review of Systems  Constitutional: Negative for fever or weight change.  Respiratory: Negative for cough and shortness of breath.   Cardiovascular: Negative for chest pain or palpitations.  Gastrointestinal: Negative for abdominal pain, no bowel changes.  Musculoskeletal: Negative for gait problem or joint swelling.  Skin: Negative for rash.  Neurological: Negative for dizziness or headache.  No other specific complaints in a complete review of systems (except as listed in HPI above).      Objective:    BP (!) 114/54   Pulse 88   Resp 18   Ht 5\' 2"  (1.575 m)   Wt 182 lb 6.4 oz (82.7 kg)  SpO2 100%   BMI 33.36 kg/m    Wt Readings from Last 3 Encounters:  09/24/23 182 lb 6.4 oz (82.7 kg)  08/23/23 181 lb 1.6 oz (82.1 kg)  03/26/23 189 lb 11.2 oz (86 kg)    Physical Exam Physical Exam GENERAL: Alert, cooperative, well developed, no acute distress HEENT: Normocephalic, normal oropharynx, moist mucous membranes CHEST: Clear to auscultation bilaterally, no wheezes, rhonchi, or crackles CARDIOVASCULAR: Normal heart rate and rhythm, S1 and S2 normal without murmurs ABDOMEN: Soft, non-tender, non-distended, without organomegaly, normal bowel sounds EXTREMITIES: No cyanosis, 1+ edema NEUROLOGICAL: Cranial nerves grossly intact, moves all extremities  without gross motor or sensory deficit   Results for orders placed or performed in visit on 08/23/23  CBC with Differential/Platelet   Collection Time: 08/23/23  9:49 AM  Result Value Ref Range   WBC 7.6 3.8 - 10.8 Thousand/uL   RBC 5.15 (H) 3.80 - 5.10 Million/uL   Hemoglobin 15.4 11.7 - 15.5 g/dL   HCT 96.0 (H) 45.4 - 09.8 %   MCV 88.9 80.0 - 100.0 fL   MCH 29.9 27.0 - 33.0 pg   MCHC 33.6 32.0 - 36.0 g/dL   RDW 11.9 14.7 - 82.9 %   Platelets 362 140 - 400 Thousand/uL   MPV 10.4 7.5 - 12.5 fL   Neutro Abs 6,141 1,500 - 7,800 cells/uL   Absolute Lymphocytes 1,087 850 - 3,900 cells/uL   Absolute Monocytes 281 200 - 950 cells/uL   Eosinophils Absolute 61 15 - 500 cells/uL   Basophils Absolute 30 0 - 200 cells/uL   Neutrophils Relative % 80.8 %   Total Lymphocyte 14.3 %   Monocytes Relative 3.7 %   Eosinophils Relative 0.8 %   Basophils Relative 0.4 %  COMPLETE METABOLIC PANEL WITH GFR   Collection Time: 08/23/23  9:49 AM  Result Value Ref Range   Glucose, Bld 88 65 - 99 mg/dL   BUN 13 7 - 25 mg/dL   Creat 5.62 1.30 - 8.65 mg/dL   BUN/Creatinine Ratio SEE NOTE: 6 - 22 (calc)   Sodium 141 135 - 146 mmol/L   Potassium 3.9 3.5 - 5.3 mmol/L   Chloride 103 98 - 110 mmol/L   CO2 25 20 - 32 mmol/L   Calcium  9.9 8.6 - 10.4 mg/dL   Total Protein 7.1 6.1 - 8.1 g/dL   Albumin 4.7 3.6 - 5.1 g/dL   Globulin 2.4 1.9 - 3.7 g/dL (calc)   AG Ratio 2.0 1.0 - 2.5 (calc)   Total Bilirubin 0.6 0.2 - 1.2 mg/dL   Alkaline phosphatase (APISO) 82 37 - 153 U/L   AST 13 10 - 35 U/L   ALT 14 6 - 29 U/L  Brain natriuretic peptide   Collection Time: 08/23/23  9:49 AM  Result Value Ref Range   Brain Natriuretic Peptide 13 <100 pg/mL  Troponin I   Collection Time: 08/23/23  9:49 AM  Result Value Ref Range   Troponin I <3 < OR = 47 ng/L       Assessment & Plan:   Problem List Items Addressed This Visit       Cardiovascular and Mediastinum   Essential hypertension - Primary   Relevant  Medications   hydrochlorothiazide  (HYDRODIURIL ) 25 MG tablet   PAD (peripheral artery disease) (HCC)   Relevant Medications   hydrochlorothiazide  (HYDRODIURIL ) 25 MG tablet     Digestive   Gastroesophageal reflux disease without esophagitis     Nervous and Auditory   Parkinsonism (HCC)  Other   Hx of completed stroke (Chronic)   Obesity (BMI 35.0-39.9 without comorbidity) (Chronic)   Prediabetes   Mixed hyperlipidemia   Relevant Medications   hydrochlorothiazide  (HYDRODIURIL ) 25 MG tablet   B12 deficiency   Other Visit Diagnoses       Dependent edema       Relevant Medications   hydrochlorothiazide  (HYDRODIURIL ) 25 MG tablet        Assessment and Plan Assessment & Plan Shortness of breath and peripheral edema Persistent symptoms with normal chest x-ray and lab results. Differential diagnosis includes heart failure. Awaiting cardiology consultation, scheduled for late June, with an earlier appointment sought via cancellation list. - use compression socks -consider vascular referral if still having lower extremity edema - Ensure she remains on the cancellation list for cardiology appointment - Send hydrochlorothiazide  prescription to pharmacy  Hypertension Chronic condition managed with hydrochlorothiazide . Prescription needs to be sent to the pharmacy. - Send hydrochlorothiazide  prescription to pharmacy  Peripheral artery disease Chronic condition with no new symptoms or changes. -consider vascular referral if still having lower extremity edema  Parkinsonism Managed with carbidopa-levodopa. Swelling improved after discontinuing a medication. No worsening of tremors. She is frustrated with medication burden and may seek another neurologist if management does not improve.  GERD Managed with omeprazole . No new symptoms or changes.  Obesity Continue to work on lifestyle modification   Hyperlipidemia Managed with atorvastatin . No new symptoms or changes.  Hx  of Stroke No new symptoms or changes.  B12 deficiency Requires B12 injections. Scheduled for next Monday but requests administration today. - Administer B12 injection today  Subclinical hyperthyroidism and multinodular goiter Followed by endocrinology. Last TSH normal. No new symptoms or changes.  General Health Maintenance Scheduled for colonoscopy in May. Concerns about insurance coverage discussed. She plans to verify coverage. - Verify insurance coverage for upcoming colonoscopy  Follow-up Cardiology appointment scheduled for late June with cancellation list for earlier availability. Return next Monday for B12 shot if not administered today. - Ensure she remains on the cancellation list for cardiology appointment - Administer B12 injection today        Follow up plan: Return in about 6 months (around 03/25/2024) for follow up.

## 2023-10-01 ENCOUNTER — Telehealth: Payer: Self-pay | Admitting: Nurse Practitioner

## 2023-10-01 ENCOUNTER — Ambulatory Visit

## 2023-10-01 NOTE — Telephone Encounter (Signed)
 Copied from CRM [id number]. Topic: Clinical - Medication Refill >> Oct 01, 2023  4:20 PM Phil Braun wrote: Most Recent Primary Care Visit:  Provider: PENDER, JULIE F  Department: [facility]  Visit Type: OFFICE VISIT  Date: 09/24/2023  Medication: amLODipine  (NORVASC ) 10 MG tablet   Has the patient contacted their pharmacy? Yes   Is this the correct pharmacy for this prescription? Yes  This is the patient's preferred pharmacy:   [facility] - [place], [place] - [address] [facility] [address] [place] [address] [place] [address] Phone: [phone] Fax: [phone]    Has the prescription been filled recently? Yes  Is the patient out of the medication? Yes  Has the patient been seen for an appointment in the last year OR does the patient have an upcoming appointment? Yes  Can we respond through MyChart? Yes  Agent: Please be advised that Rx refills may take up to 3 business days. We ask that you follow-up with your pharmacy.

## 2023-10-02 ENCOUNTER — Telehealth: Payer: Self-pay

## 2023-10-02 NOTE — Telephone Encounter (Signed)
 Will check with Concha Deed when she return do to not seeing Amlodipine  on med list

## 2023-10-02 NOTE — Telephone Encounter (Signed)
 Pt.notified

## 2023-10-02 NOTE — Telephone Encounter (Signed)
 Patient calling and would like to speak with PCP about status of Amlodopine. This RN not seeing on active medication list. Please advise and see notes below.    Copied from CRM [id number]. Topic: Clinical - Medication Refill >> Oct 01, 2023  4:20 PM Phil Braun wrote: Most Recent Primary Care Visit:  Provider: PENDER, JULIE F  Department: [facility]  Visit Type: OFFICE VISIT  Date: 09/24/2023  Medication: amLODipine  (NORVASC ) 10 MG tablet   Has the patient contacted their pharmacy? Yes   Is this the correct pharmacy for this prescription? Yes  This is the patient's preferred pharmacy:   [facility] - [place], [place] - [address] [facility] [address] [place] [address] [place] [address] Phone: [phone] Fax: [phone]    Has the prescription been filled recently? Yes  Is the patient out of the medication? Yes  Has the patient been seen for an appointment in the last year OR does the patient have an upcoming appointment? Yes  Can we respond through MyChart? Yes  Agent: Please be advised that Rx refills may take up to 3 business days. We ask that you follow-up with your pharmacy. >> Oct 02, 2023 12:16 PM Felizardo Hotter wrote: Pt calling on status of amLODipine  (NORVASC ) 10 MG tablet. Pt stated she has been taking medication for years. Please call pt at [phone]

## 2023-10-11 ENCOUNTER — Other Ambulatory Visit: Payer: Self-pay

## 2023-10-11 ENCOUNTER — Ambulatory Visit: Admitting: Anesthesiology

## 2023-10-11 ENCOUNTER — Encounter
Admission: RE | Disposition: A | Discharge status: Home / Self Care | Payer: Self-pay | Source: Home / Self Care | Attending: Gastroenterology

## 2023-10-11 ENCOUNTER — Ambulatory Visit
Admission: RE | Admit: 2023-10-11 | Discharge: 2023-10-11 | Disposition: A | Discharge status: Home / Self Care | Attending: Gastroenterology | Admitting: Gastroenterology

## 2023-10-11 ENCOUNTER — Encounter: Payer: Self-pay | Admitting: Gastroenterology

## 2023-10-11 DIAGNOSIS — D123 Benign neoplasm of transverse colon: Secondary | ICD-10-CM | POA: Diagnosis not present

## 2023-10-11 DIAGNOSIS — K635 Polyp of colon: Secondary | ICD-10-CM | POA: Diagnosis not present

## 2023-10-11 DIAGNOSIS — F32A Depression, unspecified: Secondary | ICD-10-CM | POA: Insufficient documentation

## 2023-10-11 DIAGNOSIS — Z791 Long term (current) use of non-steroidal anti-inflammatories (NSAID): Secondary | ICD-10-CM | POA: Diagnosis not present

## 2023-10-11 DIAGNOSIS — K5730 Diverticulosis of large intestine without perforation or abscess without bleeding: Secondary | ICD-10-CM | POA: Insufficient documentation

## 2023-10-11 DIAGNOSIS — K219 Gastro-esophageal reflux disease without esophagitis: Secondary | ICD-10-CM | POA: Insufficient documentation

## 2023-10-11 DIAGNOSIS — Z79899 Other long term (current) drug therapy: Secondary | ICD-10-CM | POA: Insufficient documentation

## 2023-10-11 DIAGNOSIS — D124 Benign neoplasm of descending colon: Secondary | ICD-10-CM | POA: Diagnosis not present

## 2023-10-11 DIAGNOSIS — Z83719 Family history of colon polyps, unspecified: Secondary | ICD-10-CM | POA: Insufficient documentation

## 2023-10-11 DIAGNOSIS — D125 Benign neoplasm of sigmoid colon: Secondary | ICD-10-CM | POA: Insufficient documentation

## 2023-10-11 DIAGNOSIS — Z7982 Long term (current) use of aspirin: Secondary | ICD-10-CM | POA: Diagnosis not present

## 2023-10-11 DIAGNOSIS — Z1211 Encounter for screening for malignant neoplasm of colon: Secondary | ICD-10-CM | POA: Insufficient documentation

## 2023-10-11 DIAGNOSIS — Z860101 Personal history of adenomatous and serrated colon polyps: Secondary | ICD-10-CM | POA: Diagnosis not present

## 2023-10-11 DIAGNOSIS — Z8673 Personal history of transient ischemic attack (TIA), and cerebral infarction without residual deficits: Secondary | ICD-10-CM | POA: Insufficient documentation

## 2023-10-11 DIAGNOSIS — K5140 Inflammatory polyps of colon without complications: Secondary | ICD-10-CM | POA: Diagnosis not present

## 2023-10-11 DIAGNOSIS — I739 Peripheral vascular disease, unspecified: Secondary | ICD-10-CM | POA: Insufficient documentation

## 2023-10-11 DIAGNOSIS — I10 Essential (primary) hypertension: Secondary | ICD-10-CM | POA: Insufficient documentation

## 2023-10-11 HISTORY — PX: COLONOSCOPY: SHX5424

## 2023-10-11 HISTORY — PX: POLYPECTOMY: SHX149

## 2023-10-11 SURGERY — COLONOSCOPY
Anesthesia: General

## 2023-10-11 MED ORDER — PHENYLEPHRINE 80 MCG/ML (10ML) SYRINGE FOR IV PUSH (FOR BLOOD PRESSURE SUPPORT)
PREFILLED_SYRINGE | INTRAVENOUS | Status: DC | PRN
  Administered 2023-10-11 (×2): 160 ug via INTRAVENOUS

## 2023-10-11 MED ORDER — EPHEDRINE SULFATE-NACL 50-0.9 MG/10ML-% IV SOSY
PREFILLED_SYRINGE | INTRAVENOUS | Status: DC | PRN
Start: 2023-10-11 — End: 2023-10-11
  Administered 2023-10-11 (×2): 5 mg via INTRAVENOUS

## 2023-10-11 MED ORDER — GLYCOPYRROLATE 0.2 MG/ML IJ SOLN
INTRAMUSCULAR | Status: DC | PRN
  Administered 2023-10-11: .2 mg via INTRAVENOUS

## 2023-10-11 MED ORDER — PROPOFOL 500 MG/50ML IV EMUL
INTRAVENOUS | Status: DC | PRN
  Administered 2023-10-11: 155 ug/kg/min via INTRAVENOUS

## 2023-10-11 MED ORDER — PROPOFOL 10 MG/ML IV BOLUS
INTRAVENOUS | Status: DC | PRN
Start: 2023-10-11 — End: 2023-10-11
  Administered 2023-10-11: 50 mg via INTRAVENOUS

## 2023-10-11 MED ORDER — SODIUM CHLORIDE 0.9 % IV SOLN: INTRAVENOUS | Status: DC

## 2023-10-11 MED ORDER — LIDOCAINE HCL (CARDIAC) PF 100 MG/5ML IV SOSY
PREFILLED_SYRINGE | INTRAVENOUS | Status: DC | PRN
  Administered 2023-10-11: 100 mg via INTRAVENOUS

## 2023-10-11 NOTE — Transfer of Care (Signed)
 Immediate Anesthesia Transfer of Care Note  Patient: Patricia Richardson  Procedure(s) Performed: COLONOSCOPY POLYPECTOMY, INTESTINE  Patient Location: Endoscopy Unit  Anesthesia Type:General  Level of Consciousness: drowsy and patient cooperative  Airway & Oxygen Therapy: Patient Spontanous Breathing and Patient connected to face mask oxygen  Post-op Assessment: Report given to RN and Post -op Vital signs reviewed and stable  Post vital signs: Reviewed and stable  Last Vitals:  Vitals Value Taken Time  BP 97/48 10/11/23 0854  Temp    Pulse 102 10/11/23 0854  Resp 16 10/11/23 0854  SpO2 100 % 10/11/23 0854    Last Pain:  Vitals:   10/11/23 0854  TempSrc:   PainSc: Asleep         Complications: No notable events documented.

## 2023-10-11 NOTE — Anesthesia Procedure Notes (Signed)
 Procedure Name: General with mask airway Date/Time: 10/11/2023 8:30 AM  Performed by: Niki Barter, CRNAPre-anesthesia Checklist: Patient identified, Emergency Drugs available, Suction available and Patient being monitored Patient Re-evaluated:Patient Re-evaluated prior to induction Oxygen Delivery Method: Simple face mask Induction Type: IV induction Placement Confirmation: positive ETCO2 and breath sounds checked- equal and bilateral Dental Injury: Teeth and Oropharynx as per pre-operative assessment

## 2023-10-11 NOTE — Interval H&P Note (Signed)
 History and Physical Interval Note: Preprocedure H&P from 10/11/23  was reviewed and there was no interval change after seeing and examining the patient.  Written consent was obtained from the patient after discussion of risks, benefits, and alternatives. 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  The various methods of treatment have been discussed with the patient and family. After consideration of risks, benefits and other options for treatment, the patient has consented to  Procedure(s): COLONOSCOPY (N/A) as a surgical intervention.  The patient's history has been reviewed, patient examined, no change in status, stable for surgery.  I have reviewed the patient's chart and labs.  Questions were answered to the patient's satisfaction.     Quintin Buckle

## 2023-10-11 NOTE — Op Note (Signed)
 [facility] Gastroenterology Patient Name: Nakala Schoeller Procedure Date: 10/11/2023 8:27 AM MRN: [id number] Account #: [id number] Date of Birth: 13-Jul-1950 Admit Type: Outpatient Age: 73 Room: [facility] ENDO ROOM 1 Gender: Female Note Status: Finalized Instrument Name: Peds Colonoscope [id number] Procedure:             Colonoscopy Indications:           High risk colon cancer surveillance: Personal history                         of colonic polyps Providers:             Bridgett Camps, DO Referring MD:          Quintin Buckle DO, DO (Referring MD), Monalisa Angles.                         Abram Hoguet (Referring MD) Medicines:             Monitored Anesthesia Care Complications:         No immediate complications. Estimated blood loss:                         Minimal. Procedure:             Pre-Anesthesia Assessment:                        - Prior to the procedure, a History and Physical was                         performed, and patient medications and allergies were                         reviewed. The patient is competent. The risks and                         benefits of the procedure and the sedation options and                         risks were discussed with the patient. All questions                         were answered and informed consent was obtained.                         Patient identification and proposed procedure were                         verified by the physician, the nurse, the anesthetist                         and the technician in the endoscopy suite. Mental                         Status Examination: alert and oriented. Airway                         Examination: normal oropharyngeal airway and neck  mobility. Respiratory Examination: clear to                         auscultation. CV Examination: RRR, no murmurs, no S3                         or S4. Prophylactic Antibiotics: The patient does not                          require prophylactic antibiotics. Prior                         Anticoagulants: The patient has taken no anticoagulant                         or antiplatelet agents. ASA Grade Assessment: III - A                         patient with severe systemic disease. After reviewing                         the risks and benefits, the patient was deemed in                         satisfactory condition to undergo the procedure. The                         anesthesia plan was to use monitored anesthesia care                         (MAC). Immediately prior to administration of                         medications, the patient was re-assessed for adequacy                         to receive sedatives. The heart rate, respiratory                         rate, oxygen saturations, blood pressure, adequacy of                         pulmonary ventilation, and response to care were                         monitored throughout the procedure. The physical                         status of the patient was re-assessed after the                         procedure.                        After obtaining informed consent, the colonoscope was                         passed under direct vision. Throughout the procedure,  the patient's blood pressure, pulse, and oxygen                         saturations were monitored continuously. The                         Colonoscope was introduced through the anus and                         advanced to the the terminal ileum, with                         identification of the appendiceal orifice and IC                         valve. The colonoscopy was performed without                         difficulty. The patient tolerated the procedure well.                         The quality of the bowel preparation was evaluated                         using the BBPS [facility] Bowel Preparation Scale) with                         scores of: Right Colon = 3 (entire  mucosa seen well                         with no residual staining, small fragments of stool or                         opaque liquid), Transverse Colon = 3 (entire mucosa                         seen well with no residual staining, small fragments                         of stool or opaque liquid) and Left Colon = 2 (minor                         amount of residual staining, small fragments of stool                         and/or opaque liquid, but mucosa seen well). The total                         BBPS score equals 8. The quality of the bowel                         preparation was excellent. The terminal ileum,                         ileocecal valve, appendiceal orifice, and rectum were  photographed. Findings:      The perianal and digital rectal examinations were normal. Pertinent       negatives include normal sphincter tone.      The terminal ileum appeared normal. Estimated blood loss: none.      Multiple small-mouthed diverticula were found in the entire colon.       Estimated blood loss: none.      Three sessile polyps were found in the descending colon, transverse       colon and cecum. The polyps were 1 to 2 mm in size. These polyps were       removed with a jumbo cold forceps. Resection and retrieval were       complete. Estimated blood loss was minimal.      A 3 to 4 mm polyp was found in the sigmoid colon. The polyp was       semi-pedunculated. Inflammatory appearing polyp emanating from       diverticulum. Polypectomy was not attempted due to given location and       inflammation. Biopsies were taken with a cold forceps for histology.       Estimated blood loss was minimal.      The exam was otherwise without abnormality on direct and retroflexion       views. Impression:            - The examined portion of the ileum was normal.                        - Diverticulosis in the entire examined colon.                        - Three 1 to 2 mm polyps  in the descending colon, in                         the transverse colon and in the cecum, removed with a                         jumbo cold forceps. Resected and retrieved.                        - One 3 to 4 mm polyp in the sigmoid colon. Resection                         not attempted. Biopsied.                        - The examination was otherwise normal on direct and                         retroflexion views. Recommendation:        - Patient has a contact number available for                         emergencies. The signs and symptoms of potential                         delayed complications were discussed with the patient.                         Return  to normal activities tomorrow. Written                         discharge instructions were provided to the patient.                        - Discharge patient to home.                        - Resume previous diet.                        - Continue present medications.                        - Await pathology results.                        - Repeat colonoscopy for surveillance based on                         pathology results.                        - Return to referring physician as previously                         scheduled.                        - The findings and recommendations were discussed with                         the patient. Procedure Code(s):     --- Professional ---                        [phone], Colonoscopy, flexible; with biopsy, single or                         multiple Diagnosis Code(s):     --- Professional ---                        Z86.010, Personal history of colonic polyps                        D12.4, Benign neoplasm of descending colon                        D12.3, Benign neoplasm of transverse colon (hepatic                         flexure or splenic flexure)                        D12.0, Benign neoplasm of cecum                        D12.5, Benign neoplasm of sigmoid colon                         K57.30, Diverticulosis of large intestine without  perforation or abscess without bleeding CPT copyright 2022 American Medical Association. All rights reserved. The codes documented in this report are preliminary and upon coder review may  be revised to meet current compliance requirements. Attending Participation:      I personally performed the entire procedure. Polo Brisk, DO Quintin Buckle DO, DO 10/11/2023 8:55:08 AM This report has been signed electronically. Number of Addenda: 0 Note Initiated On: 10/11/2023 8:27 AM Scope Withdrawal Time: 0 hours 13 minutes 36 seconds  Total Procedure Duration: 0 hours 16 minutes 45 seconds  Estimated Blood Loss:  Estimated blood loss was minimal.      [facility]

## 2023-10-11 NOTE — Anesthesia Postprocedure Evaluation (Signed)
 Anesthesia Post Note  Patient: Patricia Richardson  Procedure(s) Performed: COLONOSCOPY POLYPECTOMY, INTESTINE  Patient location during evaluation: PACU Anesthesia Type: General Level of consciousness: awake and alert Pain management: pain level controlled Vital Signs Assessment: post-procedure vital signs reviewed and stable Respiratory status: spontaneous breathing, nonlabored ventilation, respiratory function stable and patient connected to nasal cannula oxygen Cardiovascular status: blood pressure returned to baseline and stable Postop Assessment: no apparent nausea or vomiting Anesthetic complications: no   No notable events documented.   Last Vitals:  Vitals:   10/11/23 0854 10/11/23 0904  BP: (!) 97/48 (!) 106/58  Pulse: (!) 102 (!) 105  Resp: 16   Temp: (!) 35.7 C   SpO2: 100% 100%    Last Pain:  Vitals:   10/11/23 0904  TempSrc:   PainSc: 0-No pain                 Zula Hitch

## 2023-10-11 NOTE — H&P (Signed)
 Pre-Procedure H&P   Patient ID: Patricia Richardson is a 73 y.o. female.  Gastroenterology Provider: Quintin Buckle, DO  Referring Provider: Jamee Mazzoni, PA PCP: Quinton Buckler, FNP  Date: 10/11/2023  HPI Ms. Patricia Richardson is a 73 y.o. female who presents today for Colonoscopy for Personal history of colon polyps .    Patient last underwent colonoscopy in November 2023 with a normal TI.Aaron Aas  She had 8 tubular adenomas removed and 1 sessile serrated polyp.  Pandiverticulosis  Ulcer colonoscopy 2020 with 6 adenomatous polyps removed.  2017 4 adenomatous polyps  Family hx  Brother with history of colon polyps.  No family history of colon cancer  Status post hysterectomy cholecystectomy Right hip replacement  Hgb 15. 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/2001    Past Surgical History:  Procedure Laterality Date   ABDOMINAL HYSTERECTOMY     BREAST BIOPSY Right 11/03/2005   core biopsy w/ clip placement   CATARACT EXTRACTION W/PHACO Right 09/15/2020   Procedure: CATARACT EXTRACTION PHACO AND INTRAOCULAR LENS PLACEMENT (IOC) RIGHT;  Surgeon: Annell Kidney, MD;  Location: [facility];  Service: Ophthalmology;  Laterality: Right;  4.70 0:57.4 8.2%   CATARACT EXTRACTION W/PHACO Left 09/29/2020   Procedure: CATARACT EXTRACTION PHACO AND INTRAOCULAR LENS PLACEMENT (IOC) LEFT 3.01 00:45.7 6.6%;  Surgeon: Annell Kidney, MD;  Location: [facility];  Service: Ophthalmology;  Laterality: Left;   CHOLECYSTECTOMY     COLONOSCOPY     COLONOSCOPY WITH PROPOFOL  N/A 11/05/2015   Procedure: COLONOSCOPY WITH PROPOFOL ;  Surgeon: Deveron Fly, MD;  Location: [facility] ENDOSCOPY;  Service: Endoscopy;   Laterality: N/A;   COLONOSCOPY WITH PROPOFOL  N/A 04/21/2022   Procedure: COLONOSCOPY WITH PROPOFOL ;  Surgeon: Quintin Buckle, DO;  Location: [facility] ENDOSCOPY;  Service: Gastroenterology;  Laterality: N/A;   ESOPHAGOGASTRODUODENOSCOPY (EGD) WITH PROPOFOL  N/A 04/21/2022   Procedure: ESOPHAGOGASTRODUODENOSCOPY (EGD) WITH PROPOFOL ;  Surgeon: Quintin Buckle, DO;  Location: [facility] ENDOSCOPY;  Service: Gastroenterology;  Laterality: N/A;   JOINT REPLACEMENT  04-09-2018   KNEE ARTHROSCOPY Right 02/21/2008   TOTAL HIP ARTHROPLASTY Right 04/09/2018   Procedure: TOTAL HIP ARTHROPLASTY ANTERIOR APPROACH;  Surgeon: Molli Angelucci, MD;  Location: [facility] ORS;  Service: Orthopedics;  Laterality: Right;   WISDOM TOOTH EXTRACTION      Family History Brother- colon polyps No h/o GI disease or malignancy  Review of Systems  Constitutional:  Negative for activity change, appetite change, chills, diaphoresis, fatigue, fever and unexpected weight change.  HENT:  Negative for trouble swallowing and voice change.   Respiratory:  Negative for shortness of breath and wheezing.   Cardiovascular:  Negative for chest pain, palpitations and leg swelling.  Gastrointestinal:  Negative for abdominal distention, abdominal pain, anal bleeding, blood in stool, constipation, diarrhea, nausea, rectal pain and vomiting.  Musculoskeletal:  Negative for arthralgias and myalgias.  Skin:  Negative for color change and pallor.  Neurological:  Negative for dizziness, syncope and weakness.  Psychiatric/Behavioral:  Negative for confusion.   All other systems reviewed and are negative.    Medications No current facility-administered medications on file prior to encounter.   Current Outpatient Medications on File Prior  to Encounter  Medication Sig Dispense Refill   aspirin  EC 81 MG tablet Take 1 tablet (81 mg total) by mouth daily. 90 tablet 3   atorvastatin  (LIPITOR) 10 MG tablet TAKE 1 TABLET BY MOUTH AT  BEDTIME 2 NIGHTS  WEEKLY 29 tablet 0   carbidopa-levodopa (SINEMET IR) 25-100 MG tablet Take 1 tablet by mouth 4 (four) times daily.     escitalopram  (LEXAPRO ) 10 MG tablet Take 10 mg by mouth at bedtime. 2 at night     gabapentin  (NEURONTIN ) 100 MG capsule Take 400 mg by mouth 4 (four) times daily.     hydrochlorothiazide  (HYDRODIURIL ) 25 MG tablet Take 1 tablet (25 mg total) by mouth daily. 100 tablet 1   meloxicam  (MOBIC ) 15 MG tablet Take 15 mg by mouth daily.     methimazole (TAPAZOLE) 5 MG tablet Take by mouth.     omeprazole  (PRILOSEC) 20 MG capsule TAKE 1 CAPSULE BY MOUTH EVERY  OTHER DAY 50 capsule 2   OVER THE COUNTER MEDICATION Take 1 tablet by mouth daily as needed (constipation). Swiss Kriss herbal laxative      Pertinent medications related to GI and procedure were reviewed by me with the patient prior to the procedure   Current Facility-Administered Medications:    0.9 %  sodium chloride  infusion, , Intravenous, Continuous, Quintin Buckle, DO, Last Rate: 20 mL/hr at 10/11/23 0747, New Bag at 10/11/23 0747  sodium chloride  20 mL/hr at 10/11/23 0747       Allergies  Allergen Reactions   Codeine Itching    Pin and needles    Allergies were reviewed by me prior to the procedure  Objective   Body mass index is 32.74 kg/m. Vitals:   10/11/23 0731  BP: 128/85  Pulse: (!) 101  Resp: 18  Temp: 98 F (36.7 C)  TempSrc: Temporal  SpO2: 97%  Weight: 81.2 kg  Height: 5\' 2"  (1.575 m)     Physical Exam Vitals and nursing note reviewed.  Constitutional:      General: She is not in acute distress.    Appearance: Normal appearance. She is not ill-appearing, toxic-appearing or diaphoretic.  HENT:     Head: Normocephalic and atraumatic.     Nose: Nose normal.     Mouth/Throat:     Mouth: Mucous membranes are moist.     Pharynx: Oropharynx is clear.  Eyes:     General: No scleral icterus.    Extraocular Movements: Extraocular movements intact.  Cardiovascular:     Rate and  Rhythm: Regular rhythm. Tachycardia present.     Heart sounds: Normal heart sounds. No murmur heard.    No friction rub. No gallop.  Pulmonary:     Effort: Pulmonary effort is normal. No respiratory distress.     Breath sounds: Normal breath sounds. No wheezing, rhonchi or rales.  Abdominal:     General: Bowel sounds are normal. There is no distension.     Palpations: Abdomen is soft.     Tenderness: There is no abdominal tenderness. There is no guarding or rebound.  Musculoskeletal:     Cervical back: Neck supple.     Right lower leg: No edema.     Left lower leg: No edema.  Skin:    General: Skin is warm and dry.     Coloration: Skin is not jaundiced or pale.  Neurological:     General: No focal deficit present.     Mental Status: She is alert and oriented to  person, place, and time. Mental status is at baseline.     Comments: Chin tremor  Psychiatric:        Mood and Affect: Mood normal.        Behavior: Behavior normal.        Thought Content: Thought content normal.        Judgment: Judgment normal.      Assessment:  Ms. Patricia Richardson is a 73 y.o. female  who presents today for Colonoscopy for Personal history of colon polyps .  Plan:  Colonoscopy with possible intervention today  Colonoscopy with possible biopsy, control of bleeding, polypectomy, and interventions as necessary has been discussed with the patient/patient representative. Informed consent was obtained from the patient/patient representative after explaining the indication, nature, and risks of the procedure including but not limited to death, bleeding, perforation, missed neoplasm/lesions, cardiorespiratory compromise, and reaction to medications. Opportunity for questions was given and appropriate answers were provided. Patient/patient representative has verbalized understanding is amenable to undergoing the procedure.   Quintin Buckle, DO  [facility]astroenterology  Portions of the record  may have been created with voice recognition software. Occasional wrong-word or 'sound-a-like' substitutions may have occurred due to the inherent limitations of voice recognition software.  Read the chart carefully and recognize, using context, where substitutions may have occurred.

## 2023-10-11 NOTE — Anesthesia Preprocedure Evaluation (Signed)
 Anesthesia Evaluation  Patient identified by MRN, date of birth, ID band Patient awake    Reviewed: Allergy & Precautions, H&P , NPO status , Patient's Chart, lab work & pertinent test results, reviewed documented beta blocker date and time   Airway Mallampati: II   Neck ROM: full    Dental  (+) Poor Dentition   Pulmonary neg pulmonary ROS   Pulmonary exam normal        Cardiovascular Exercise Tolerance: Poor hypertension, On Medications + Peripheral Vascular Disease  Normal cardiovascular exam Rhythm:regular Rate:Normal     Neuro/Psych  PSYCHIATRIC DISORDERS  Depression     Neuromuscular disease CVA    GI/Hepatic Neg liver ROS,GERD  Medicated,,  Endo/Other  negative endocrine ROS    Renal/GU negative Renal ROS  negative genitourinary   Musculoskeletal   Abdominal   Peds  Hematology negative hematology ROS (+)   Anesthesia Other Findings Past Medical History: 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 No date: Arthritis No date: Cystocele No date: Depression 06/17/2018: Essential hypertension, benign No date: GERD (gastroesophageal reflux disease) No date: Glaucoma No date: History of chicken pox No date: Hypertension 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HCC) Past Surgical History: No date: ABDOMINAL HYSTERECTOMY 11/03/2005: BREAST BIOPSY; Right     Comment:  core biopsy w/ clip placement 09/15/2020: CATARACT EXTRACTION W/PHACO; Right     Comment:  Procedure: CATARACT EXTRACTION PHACO AND INTRAOCULAR               LENS PLACEMENT (IOC) RIGHT;  Surgeon: Annell Kidney, MD;  Location: [facility];  Service:               Ophthalmology;  Laterality: Right;  4.70 0:57.4 8.2% 09/29/2020: CATARACT EXTRACTION W/PHACO; Left     Comment:  Procedure: CATARACT EXTRACTION PHACO AND INTRAOCULAR               LENS PLACEMENT (IOC) LEFT 3.01 00:45.7 6.6%;  Surgeon:               Annell Kidney, MD;   Location: [facility];              Service: Ophthalmology;  Laterality: Left; No date: CHOLECYSTECTOMY No date: COLONOSCOPY 11/05/2015: COLONOSCOPY WITH PROPOFOL ; N/A     Comment:  Procedure: COLONOSCOPY WITH PROPOFOL ;  Surgeon: Deveron Fly, MD;  Location: [facility] ENDOSCOPY;  Service:               Endoscopy;  Laterality: N/A; 04/21/2022: COLONOSCOPY WITH PROPOFOL ; N/A     Comment:  Procedure: COLONOSCOPY WITH PROPOFOL ;  Surgeon: Quintin Buckle, DO;  Location: [facility] ENDOSCOPY;  Service:               Gastroenterology;  Laterality: N/A; 04/21/2022: ESOPHAGOGASTRODUODENOSCOPY (EGD) WITH PROPOFOL ; N/A     Comment:  Procedure: ESOPHAGOGASTRODUODENOSCOPY (EGD) WITH               PROPOFOL ;  Surgeon: Quintin Buckle, DO;  Location:              [facility] ENDOSCOPY;  Service: Gastroenterology;  Laterality:               N/A; 04-09-2018: JOINT REPLACEMENT 02/21/2008: KNEE ARTHROSCOPY; Right 04/09/2018: TOTAL HIP ARTHROPLASTY; Right     Comment:  Procedure: TOTAL HIP ARTHROPLASTY ANTERIOR APPROACH;                Surgeon: Molli Angelucci, MD;  Location: [facility] ORS;                Service: Orthopedics;  Laterality: Right; No date: WISDOM TOOTH EXTRACTION BMI    Body Mass Index: 32.74 kg/m     Reproductive/Obstetrics negative OB ROS                              Anesthesia Physical Anesthesia Plan  ASA: 3  Anesthesia Plan: General   Post-op Pain Management:    Induction:   PONV Risk Score and Plan:   Airway Management Planned:   Additional Equipment:   Intra-op Plan:   Post-operative Plan:   Informed Consent: I have reviewed the patients History and Physical, chart, labs and discussed the procedure including the risks, benefits and alternatives for the proposed anesthesia with the patient or authorized representative who has indicated his/her understanding and acceptance.     Dental Advisory Given  Plan  Discussed with: CRNA  Anesthesia Plan Comments:          Anesthesia Quick Evaluation

## 2023-10-12 ENCOUNTER — Encounter: Payer: Self-pay | Admitting: Gastroenterology

## 2023-10-15 LAB — SURGICAL PATHOLOGY

## 2023-10-24 ENCOUNTER — Ambulatory Visit

## 2023-11-07 ENCOUNTER — Other Ambulatory Visit: Payer: Self-pay | Admitting: Nurse Practitioner

## 2023-11-07 DIAGNOSIS — Z8673 Personal history of transient ischemic attack (TIA), and cerebral infarction without residual deficits: Secondary | ICD-10-CM

## 2023-11-08 NOTE — Telephone Encounter (Signed)
 Requested medications are due for refill today.  yes  Requested medications are on the active medications list.  yes  Last refill. 08/31/2023 #29 0 rf  Future visit scheduled.   no  Notes to clinic.  Labs are expired.    Requested Prescriptions  Pending Prescriptions Disp Refills   atorvastatin  (LIPITOR) 10 MG tablet [Pharmacy Med Name: Atorvastatin  Calcium  10 MG Oral Tablet] 29 tablet 2    Sig: TAKE 1 TABLET BY MOUTH AT  BEDTIME 2 NIGHTS WEEKLY     Cardiovascular:  Antilipid - Statins Failed - 11/08/2023  5:39 PM      Failed - Lipid Panel in normal range within the last 12 months    Cholesterol  Date Value Ref Range Status  09/19/2022 150 <200 mg/dL Final   LDL Cholesterol (Calc)  Date Value Ref Range Status  09/19/2022 69 mg/dL (calc) Final    Comment:    Reference range: <100 . Desirable range <100 mg/dL for primary prevention;   <70 mg/dL for patients with CHD or diabetic patients  with > or = 2 CHD risk factors. Aaron Aas LDL-C is now calculated using the Martin-Hopkins  calculation, which is a validated novel method providing  better accuracy than the Friedewald equation in the  estimation of LDL-C.  Melinda Sprawls et al. Erroll Heard. 0865;784(69): 2061-2068  (http://education.[employer].com/faq/FAQ164)    HDL  Date Value Ref Range Status  09/19/2022 60 > OR = 50 mg/dL Final   Triglycerides  Date Value Ref Range Status  09/19/2022 128 <150 mg/dL Final         Passed - Patient is not pregnant      Passed - Valid encounter within last 12 months    Recent Outpatient Visits           1 month ago Essential hypertension   [facility] Quinton Buckler, FNP   2 months ago Shortness of breath   [facility] Quinton Buckler, FNP       Future Appointments             In 2 weeks Agbor-Etang, Polly Brink, MD [facility]

## 2023-11-28 ENCOUNTER — Encounter: Payer: Self-pay | Admitting: Cardiology

## 2023-11-28 ENCOUNTER — Ambulatory Visit (INDEPENDENT_AMBULATORY_CARE_PROVIDER_SITE_OTHER)

## 2023-11-28 ENCOUNTER — Ambulatory Visit: Attending: Cardiology | Admitting: Cardiology

## 2023-11-28 VITALS — BP 122/72 | HR 88 | Ht 62.0 in | Wt 184.4 lb

## 2023-11-28 DIAGNOSIS — R072 Precordial pain: Secondary | ICD-10-CM

## 2023-11-28 DIAGNOSIS — R0609 Other forms of dyspnea: Secondary | ICD-10-CM | POA: Diagnosis not present

## 2023-11-28 DIAGNOSIS — E538 Deficiency of other specified B group vitamins: Secondary | ICD-10-CM | POA: Diagnosis not present

## 2023-11-28 MED ORDER — METOPROLOL TARTRATE 100 MG PO TABS: ORAL_TABLET | ORAL | 0 refills | Status: DC

## 2023-11-28 MED ORDER — CYANOCOBALAMIN 1000 MCG/ML IJ SOLN
1000.0000 ug | Freq: Once | INTRAMUSCULAR | Status: AC
  Administered 2023-11-28: 1000 ug via INTRAMUSCULAR

## 2023-11-28 NOTE — Patient Instructions (Signed)
 Medication Instructions:  -take one 100 mg metoprolol two hours prior to cardiac CTA   *If you need a refill on your cardiac medications before your next appointment, please call your pharmacy*  Lab Work: Your provider would like for you to have following labs drawn today BMP.    If you have labs (blood work) drawn today and your tests are completely normal, you will receive your results only by: MyChart Message (if you have MyChart) OR A paper copy in the mail If you have any lab test that is abnormal or we need to change your treatment, we will call you to review the results.  Testing/Procedures: Your physician has requested that you have an echocardiogram. Echocardiography is a painless test that uses sound waves to create images of your heart. It provides your doctor with information about the size and shape of your heart and how well your heart's chambers and valves are working.   You may receive an ultrasound enhancing agent through an IV if needed to better visualize your heart during the echo. This procedure takes approximately one hour.  There are no restrictions for this procedure.  This will take place at [address] [facility]) [address], [place] [address]  Please note: We ask at that you not bring children with you during ultrasound (echo/ vascular) testing. Due to room size and safety concerns, children are not allowed in the ultrasound rooms during exams. Our front office staff cannot provide observation of children in our lobby area while testing is being conducted. An adult accompanying a patient to their appointment will only be allowed in the ultrasound room at the discretion of the ultrasound technician under special circumstances. We apologize for any inconvenience.     Your cardiac CT will be scheduled at:  [facility] [address] [place], [place] [address] [phone]  Please arrive 15 mins early for check-in and test  prep.  There is spacious parking and easy access to the radiology department from the [facility] Heart and Vascular entrance. Please enter here and check-in with the desk attendant.    Please follow these instructions carefully (unless otherwise directed):  An IV will be required for this test and Nitroglycerin will be given.  Hold all erectile dysfunction medications at least 3 days (72 hrs) prior to test. (Ie viagra, cialis, sildenafil, tadalafil, etc)   On the Night Before the Test: Be sure to Drink plenty of water. Do not consume any caffeinated/decaffeinated beverages or chocolate 12 hours prior to your test. Do not take any antihistamines 12 hours prior to your test.  On the Day of the Test: Drink plenty of water until 1 hour prior to the test. Do not eat any food 1 hour prior to test. You may take your regular medications prior to the test.  Take metoprolol (Lopressor) two hours prior to test. If you take Furosemide/Hydrochlorothiazide /Spironolactone/Chlorthalidone, please HOLD on the morning of the test. Patients who wear a continuous glucose monitor MUST remove the device prior to scanning. FEMALES- please wear underwire-free bra if available, avoid dresses & tight clothing       After the Test: Drink plenty of water. After receiving IV contrast, you may experience a mild flushed feeling. This is normal. On occasion, you may experience a mild rash up to 24 hours after the test. This is not dangerous. If this occurs, you can take Benadryl  25 mg, Zyrtec, Claritin, or Allegra and increase your fluid intake. (Patients taking Tikosyn should  avoid Benadryl , and may take Zyrtec, Claritin, or Allegra) If you experience trouble breathing, this can be serious. If it is severe call 911 IMMEDIATELY. If it is mild, please call our office.  We will call to schedule your test 2-4 weeks out understanding that some insurance companies will need an authorization prior to the service being performed.    For more information and frequently asked questions, please visit our website : [email or web address]  For non-scheduling related questions, please contact the cardiac imaging nurse navigator should you have any questions/concerns: Cardiac Imaging Nurse Navigators Direct Office Dial: [phone]   For scheduling needs, including cancellations and rescheduling, please call [place], [phone].    Follow-Up: At [facility], you and your health needs are our priority.  As part of our continuing mission to provide you with exceptional heart care, our providers are all part of one team.  This team includes your primary Cardiologist (physician) and Advanced Practice Providers or APPs (Physician Assistants and Nurse Practitioners) who all work together to provide you with the care you need, when you need it.  Your next appointment:   2-3 month(s)  Provider:   You may see Dr. Darliss or one of the following Advanced Practice Providers on your designated Care Team:   Lonni Meager, NP Lesley Maffucci, PA-C Bernardino Bring, PA-C Cadence [place], PA-C Tylene Lunch, NP Barnie Hila, NP    We recommend signing up for the patient portal called MyChart.  Sign up information is provided on this After Visit Summary.  MyChart is used to connect with patients for Virtual Visits (Telemedicine).  Patients are able to view lab/test results, encounter notes, upcoming appointments, etc.  Non-urgent messages can be sent to your provider as well.   To learn more about what you can do with MyChart, go to [email or web address].

## 2023-11-28 NOTE — Progress Notes (Signed)
 Patient is in office today for a nurse visit for B12 Injection. Patient Injection was given in the  Left deltoid. Patient tolerated injection well.

## 2023-11-28 NOTE — Progress Notes (Signed)
 Cardiology Office Note:    Date:  11/28/2023   ID:  Patricia Richardson, DOB 30-Jun-1950, MRN [id number]  PCP:  Gareth Mliss FALCON, FNP   [facility] Providers Cardiologist:  None     Referring MD: Gareth Mliss FALCON, FNP   Chief Complaint  Patient presents with   Establish Care    New pt has been doing well with complaints of chest pain on Monday of this week and is very tired , chest pressure or SOB, medciation reviewed verbally with patient    History of Present Illness:    Patricia Richardson is a 73 y.o. female with a hx of TIA in 2003, Parkinson's, depression who presents with shortness of breath and leg edema.  Endorses shortness of breath ongoing over the past 6 months or so.  Symptoms are sometimes related with exertion although can occur at rest.  Also has leg edema occurring 4 months ago.  Was prescribed amantadine by her neurologist which thought to have caused the leg edema.  Stopped taking amantadine with improvement in edema.  Has occasional chest pain not associated with exertion.  She denies smoking, denies history of hypertension, takes HCTZ to help with leg edema, prescribed over 2 years ago.  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HCC) 10/2001    Past Surgical History:  Procedure Laterality Date   ABDOMINAL HYSTERECTOMY     BREAST BIOPSY Right 11/03/2005   core biopsy w/ clip placement   CATARACT EXTRACTION W/PHACO Right 09/15/2020   Procedure: CATARACT EXTRACTION PHACO AND INTRAOCULAR LENS PLACEMENT (IOC) RIGHT;  Surgeon: Mittie Gaskin, MD;  Location: [facility];  Service: Ophthalmology;  Laterality: Right;  4.70 0:57.4 8.2%   CATARACT EXTRACTION W/PHACO Left 09/29/2020   Procedure: CATARACT EXTRACTION PHACO AND  INTRAOCULAR LENS PLACEMENT (IOC) LEFT 3.01 00:45.7 6.6%;  Surgeon: Mittie Gaskin, MD;  Location: [facility];  Service: Ophthalmology;  Laterality: Left;   CHOLECYSTECTOMY     COLONOSCOPY     COLONOSCOPY N/A 10/11/2023   Procedure: COLONOSCOPY;  Surgeon: Onita Elspeth Sharper, DO;  Location: [facility] END[facility];  Service: Gastroenterology;  Laterality: N/A;   COLONOSCOPY WITH PROPOFOL  N/A 11/05/2015   Procedure: COLONOSCOPY WITH PROPOFOL ;  Surgeon: Gladis RAYMOND Mariner, MD;  Location: [facility] ENDOSCOPY;  Service: Endoscopy;  Laterality: N/A;   COLONOSCOPY WITH PROPOFOL  N/A 04/21/2022   Procedure: COLONOSCOPY WITH PROPOFOL ;  Surgeon: Onita Elspeth Sharper, DO;  Location: [facility] ENDOSCOPY;  Service: Gastroenterology;  Laterality: N/A;   ESOPHAGOGASTRODUODENOSCOPY (EGD) WITH PROPOFOL  N/A 04/21/2022   Procedure: ESOPHAGOGASTRODUODENOSCOPY (EGD) WITH PROPOFOL ;  Surgeon: Onita Elspeth Sharper, DO;  Location: [facility] ENDOSCOPY;  Service: Gastroenterology;  Laterality: N/A;   JOINT REPLACEMENT  04-09-2018   KNEE ARTHROSCOPY Right 02/21/2008   POLYPECTOMY  10/11/2023   Procedure: POLYPECTOMY, INTESTINE;  Surgeon: Onita Elspeth Sharper, DO;  Location: [facility];  Service: Gastroenterology;;   TOTAL HIP ARTHROPLASTY Right 04/09/2018   Procedure: TOTAL HIP ARTHROPLASTY ANTERIOR APPROACH;  Surgeon: Kathlynn Sharper, MD;  Location: [facility] ORS;  Service: Orthopedics;  Laterality: Right;   WISDOM TOOTH EXTRACTION      Current Medications: Current Meds  Medication Sig   aspirin  EC 81 MG tablet Take 1 tablet (81 mg total) by  mouth daily.   carbidopa-levodopa (SINEMET IR) 25-100 MG tablet Take 1 tablet by mouth 4 (four) times daily.   escitalopram  (LEXAPRO ) 10 MG tablet Take 10 mg by mouth at bedtime. 2 at night   gabapentin  (NEURONTIN ) 100 MG capsule Take 400 mg by mouth 4 (four) times daily.   hydrochlorothiazide  (HYDRODIURIL ) 25 MG tablet Take 1 tablet (25 mg total) by mouth daily.   meloxicam  (MOBIC ) 15 MG  tablet Take 15 mg by mouth daily.   methimazole (TAPAZOLE) 5 MG tablet Take by mouth.   metoprolol tartrate (LOPRESSOR) 100 MG tablet TAKE 1 TABLET 2 HR PRIOR TO CARDIAC PROCEDURE   omeprazole  (PRILOSEC) 20 MG capsule TAKE 1 CAPSULE BY MOUTH EVERY  OTHER DAY   OVER THE COUNTER MEDICATION Take 1 tablet by mouth daily as needed (constipation). Swiss Kriss herbal laxative     Allergies:   Codeine   Social History   Socioeconomic History   Marital status: Divorced    Spouse name: Not on file   Number of children: 2   Years of education: 12   Highest education level: 12th grade  Occupational History   Not on file  Tobacco Use   Smoking status: Never   Smokeless tobacco: Never  Vaping Use   Vaping status: Never Used  Substance and Sexual Activity   Alcohol use: Yes    Comment: rarely   Drug use: No   Sexual activity: Not Currently  Other Topics Concern   Not on file  Social History Narrative   Pt lives alone   Social Drivers of Health   Financial Resource Strain: Low Risk  (04/12/2023)   Overall Financial Resource Strain (CARDIA)    Difficulty of Paying Living Expenses: Not hard at all  Food Insecurity: No Food Insecurity (04/12/2023)   Hunger Vital Sign    Worried About Running Out of Food in the Last Year: Never true    Ran Out of Food in the Last Year: Never true  Transportation Needs: Patient Declined (08/20/2023)   PRAPARE - Transportation    Lack of Transportation (Medical): Patient declined    Lack of Transportation (Non-Medical): Patient declined  Physical Activity: Insufficiently Active (08/20/2023)   Exercise Vital Sign    Days of Exercise per Week: 2 days    Minutes of Exercise per Session: 10 min  Stress: No Stress Concern Present (08/20/2023)   [employer] of Occupational Health - Occupational Stress Questionnaire    Feeling of Stress : Not at all  Social Connections: Unknown (08/20/2023)   Social Connection and Isolation Panel    Frequency of  Communication with Friends and Family: Three times a week    Frequency of Social Gatherings with Friends and Family: Twice a week    Attends Religious Services: Patient declined    Database administrator or Organizations: Patient declined    Attends Banker Meetings: Never    Marital Status: Patient declined     Family History: The patient's family history includes Breast cancer in her maternal aunt; Diabetes in her brother, mother, and paternal grandmother; Heart attack in her father; Hypertension in her father; Kidney disease in her father and mother.  ROS:   Please see the history of present illness.     All other systems reviewed and are negative.  EKGs/Labs/Other Studies Reviewed:    The following studies were reviewed today:  EKG Interpretation Date/Time:  Wednesday November 28 2023 08:36:56 EDT Ventricular Rate:  88 PR Interval:  120 QRS  Duration:  80 QT Interval:  372 QTC Calculation: 450 R Axis:   45  Text Interpretation: Normal sinus rhythm Normal ECG Confirmed by Darliss Rogue ([id number]) on 11/28/2023 8:39:30 AM    Recent Labs: 01/24/2023: TSH 0.55 08/23/2023: ALT 14; Brain Natriuretic Peptide 13; BUN 13; Creat 0.72; Hemoglobin 15.4; Platelets 362; Potassium 3.9; Sodium 141  Recent Lipid Panel    Component Value Date/Time   CHOL 150 09/19/2022 1324   TRIG 128 09/19/2022 1324   HDL 60 09/19/2022 1324   CHOLHDL 2.5 09/19/2022 1324   LDLCALC 69 09/19/2022 1324     Risk Assessment/Calculations:             Physical Exam:    VS:  BP 122/72 (BP Location: Left Arm, Patient Position: Sitting, Cuff Size: Normal)   Pulse 88   Ht 5' 2 (1.575 m)   Wt 184 lb 6.4 oz (83.6 kg)   SpO2 99%   BMI 33.73 kg/m     Wt Readings from Last 3 Encounters:  11/28/23 184 lb 6.4 oz (83.6 kg)  10/11/23 179 lb (81.2 kg)  09/24/23 182 lb 6.4 oz (82.7 kg)     GEN:  Well nourished, well developed in no acute distress HEENT: Normal NECK: No JVD; No carotid  bruits CARDIAC: RRR, no murmurs, rubs, gallops RESPIRATORY:  Clear to auscultation without rales, wheezing or rhonchi  ABDOMEN: Soft, non-tender, non-distended MUSCULOSKELETAL:  No edema; No deformity  SKIN: Warm and dry NEUROLOGIC:  Alert and oriented x 3, resting tremors noted PSYCHIATRIC:  Normal affect   ASSESSMENT:    1. Precordial pain   2. Dyspnea on exertion    PLAN:    In order of problems listed above:  Chest pain, dyspnea on exertion, dyspnea and angina equivalent.  Plan cardiac workup with echo and coronary CTA.  Deconditioning could also be contributing.  Follow-up after cardiac testing     Medication Adjustments/Labs and Tests Ordered: Current medicines are reviewed at length with the patient today.  Concerns regarding medicines are outlined above.  Orders Placed This Encounter  Procedures   CT CORONARY MORPH W/CTA COR W/SCORE W/CA W/CM &/OR WO/CM   Basic metabolic panel with GFR   EKG 87-Ozji   ECHOCARDIOGRAM COMPLETE   Meds ordered this encounter  Medications   metoprolol tartrate (LOPRESSOR) 100 MG tablet    Sig: TAKE 1 TABLET 2 HR PRIOR TO CARDIAC PROCEDURE    Dispense:  1 tablet    Refill:  0    Patient Instructions  Medication Instructions:  -take one 100 mg metoprolol two hours prior to cardiac CTA   *If you need a refill on your cardiac medications before your next appointment, please call your pharmacy*  Lab Work: Your provider would like for you to have following labs drawn today BMP.    If you have labs (blood work) drawn today and your tests are completely normal, you will receive your results only by: MyChart Message (if you have MyChart) OR A paper copy in the mail If you have any lab test that is abnormal or we need to change your treatment, we will call you to review the results.  Testing/Procedures: Your physician has requested that you have an echocardiogram. Echocardiography is a painless test that uses sound waves to create  images of your heart. It provides your doctor with information about the size and shape of your heart and how well your heart's chambers and valves are working.   You may receive an ultrasound  enhancing agent through an IV if needed to better visualize your heart during the echo. This procedure takes approximately one hour.  There are no restrictions for this procedure.  This will take place at [address] [facility]) [address], [place] [address]  Please note: We ask at that you not bring children with you during ultrasound (echo/ vascular) testing. Due to room size and safety concerns, children are not allowed in the ultrasound rooms during exams. Our front office staff cannot provide observation of children in our lobby area while testing is being conducted. An adult accompanying a patient to their appointment will only be allowed in the ultrasound room at the discretion of the ultrasound technician under special circumstances. We apologize for any inconvenience.     Your cardiac CT will be scheduled at:  [facility] [address] [place], [place] [address] [phone]  Please arrive 15 mins early for check-in and test prep.  There is spacious parking and easy access to the radiology department from the [facility] Heart and Vascular entrance. Please enter here and check-in with the desk attendant.    Please follow these instructions carefully (unless otherwise directed):  An IV will be required for this test and Nitroglycerin will be given.  Hold all erectile dysfunction medications at least 3 days (72 hrs) prior to test. (Ie viagra, cialis, sildenafil, tadalafil, etc)   On the Night Before the Test: Be sure to Drink plenty of water. Do not consume any caffeinated/decaffeinated beverages or chocolate 12 hours prior to your test. Do not take any antihistamines 12 hours prior to your test.  On the Day of the Test: Drink plenty of water until 1  hour prior to the test. Do not eat any food 1 hour prior to test. You may take your regular medications prior to the test.  Take metoprolol (Lopressor) two hours prior to test. If you take Furosemide/Hydrochlorothiazide /Spironolactone/Chlorthalidone, please HOLD on the morning of the test. Patients who wear a continuous glucose monitor MUST remove the device prior to scanning. FEMALES- please wear underwire-free bra if available, avoid dresses & tight clothing       After the Test: Drink plenty of water. After receiving IV contrast, you may experience a mild flushed feeling. This is normal. On occasion, you may experience a mild rash up to 24 hours after the test. This is not dangerous. If this occurs, you can take Benadryl  25 mg, Zyrtec, Claritin, or Allegra and increase your fluid intake. (Patients taking Tikosyn should avoid Benadryl , and may take Zyrtec, Claritin, or Allegra) If you experience trouble breathing, this can be serious. If it is severe call 911 IMMEDIATELY. If it is mild, please call our office.  We will call to schedule your test 2-4 weeks out understanding that some insurance companies will need an authorization prior to the service being performed.   For more information and frequently asked questions, please visit our website : [email or web address]  For non-scheduling related questions, please contact the cardiac imaging nurse navigator should you have any questions/concerns: Cardiac Imaging Nurse Navigators Direct Office Dial: [phone]   For scheduling needs, including cancellations and rescheduling, please call [place], [phone].    Follow-Up: At [facility], you and your health needs are our priority.  As part of our continuing mission to provide you with exceptional heart care, our providers are all part of one team.  This team includes your primary Cardiologist (physician) and Advanced Practice Providers or APPs (Physician Assistants  and Nurse Practitioners) who all work together to provide you with the care you need, when you need it.  Your next appointment:   2-3 month(s)  Provider:   You may see Dr. Darliss or one of the following Advanced Practice Providers on your designated Care Team:   Lonni Meager, NP Lesley Maffucci, PA-C Bernardino Bring, PA-C Cadence [place], PA-C Tylene Lunch, NP Barnie Hila, NP    We recommend signing up for the patient portal called MyChart.  Sign up information is provided on this After Visit Summary.  MyChart is used to connect with patients for Virtual Visits (Telemedicine).  Patients are able to view lab/test results, encounter notes, upcoming appointments, etc.  Non-urgent messages can be sent to your provider as well.   To learn more about what you can do with MyChart, go to [email or web address].         Signed, Redell Darliss, MD  11/28/2023 12:08 PM    [facility]

## 2023-11-29 LAB — BASIC METABOLIC PANEL WITH GFR
BUN/Creatinine Ratio: 20 (ref 12–28)
BUN: 16 mg/dL (ref 8–27)
CO2: 20 mmol/L (ref 20–29)
Calcium: 10.2 mg/dL (ref 8.7–10.3)
Chloride: 103 mmol/L (ref 96–106)
Creatinine, Ser: 0.79 mg/dL (ref 0.57–1.00)
Glucose: 94 mg/dL (ref 70–99)
Potassium: 4.6 mmol/L (ref 3.5–5.2)
Sodium: 142 mmol/L (ref 134–144)
eGFR: 78 mL/min/{1.73_m2} (ref >59)

## 2023-12-10 ENCOUNTER — Ambulatory Visit: Attending: Cardiology

## 2023-12-10 DIAGNOSIS — R0609 Other forms of dyspnea: Secondary | ICD-10-CM

## 2023-12-10 DIAGNOSIS — R072 Precordial pain: Secondary | ICD-10-CM | POA: Diagnosis not present

## 2023-12-11 ENCOUNTER — Ambulatory Visit: Payer: Self-pay | Admitting: Cardiology

## 2023-12-11 LAB — ECHOCARDIOGRAM COMPLETE
AR max vel: 2.25 cm2
AV Area VTI: 2.31 cm2
AV Area mean vel: 2.22 cm2
AV Mean grad: 5 mmHg
AV Peak grad: 8.4 mmHg
Ao pk vel: 1.45 m/s
Area-P 1/2: 4.6 cm2
Calc EF: 64.1 %
S' Lateral: 3.1 cm
Single Plane A2C EF: 64.4 %
Single Plane A4C EF: 63.3 %

## 2023-12-12 ENCOUNTER — Encounter (HOSPITAL_COMMUNITY): Payer: Self-pay

## 2023-12-12 DIAGNOSIS — R262 Difficulty in walking, not elsewhere classified: Secondary | ICD-10-CM | POA: Diagnosis not present

## 2023-12-12 DIAGNOSIS — G8929 Other chronic pain: Secondary | ICD-10-CM | POA: Diagnosis not present

## 2023-12-12 DIAGNOSIS — G20C Parkinsonism, unspecified: Secondary | ICD-10-CM | POA: Diagnosis not present

## 2023-12-12 DIAGNOSIS — R413 Other amnesia: Secondary | ICD-10-CM | POA: Diagnosis not present

## 2023-12-12 DIAGNOSIS — R498 Other voice and resonance disorders: Secondary | ICD-10-CM | POA: Diagnosis not present

## 2023-12-12 DIAGNOSIS — M545 Low back pain, unspecified: Secondary | ICD-10-CM | POA: Diagnosis not present

## 2023-12-13 ENCOUNTER — Ambulatory Visit
Admission: RE | Admit: 2023-12-13 | Discharge: 2023-12-13 | Disposition: A | Discharge status: Home / Self Care | Source: Ambulatory Visit | Attending: Cardiology | Admitting: Cardiology

## 2023-12-13 DIAGNOSIS — R072 Precordial pain: Secondary | ICD-10-CM | POA: Diagnosis not present

## 2023-12-13 MED ORDER — NITROGLYCERIN 0.4 MG SL SUBL
0.8000 mg | SUBLINGUAL_TABLET | Freq: Once | SUBLINGUAL | Status: AC
  Administered 2023-12-13: 0.8 mg via SUBLINGUAL
  Filled 2023-12-13: qty 25

## 2023-12-13 MED ORDER — METOPROLOL TARTRATE 5 MG/5ML IV SOLN: 10.0000 mg | Freq: Once | INTRAVENOUS | Status: DC | PRN

## 2023-12-13 MED ORDER — DILTIAZEM HCL 25 MG/5ML IV SOLN: 10.0000 mg | INTRAVENOUS | Status: DC | PRN

## 2023-12-13 MED ORDER — IOHEXOL 350 MG/ML SOLN
100.0000 mL | Freq: Once | INTRAVENOUS | Status: AC | PRN
  Administered 2023-12-13: 100 mL via INTRAVENOUS

## 2023-12-25 ENCOUNTER — Ambulatory Visit (INDEPENDENT_AMBULATORY_CARE_PROVIDER_SITE_OTHER)

## 2023-12-25 DIAGNOSIS — E538 Deficiency of other specified B group vitamins: Secondary | ICD-10-CM | POA: Diagnosis not present

## 2023-12-25 MED ORDER — CYANOCOBALAMIN 1000 MCG/ML IJ SOLN
1000.0000 ug | Freq: Once | INTRAMUSCULAR | Status: AC
  Administered 2023-12-25: 1000 ug via INTRAMUSCULAR

## 2024-01-26 NOTE — Progress Notes (Unsigned)
 Cardiology Clinic Note   Date: 01/28/2024 ID: Patricia Richardson, Patricia Richardson 04-26-1951, MRN [id number]  Primary Cardiologist:  Redell Cave, MD  Chief Complaint   Patricia Richardson is a 73 y.o. female who presents to the clinic today for follow up after testing.   Patient Profile   Patricia Richardson is followed by Dr. Cave for the history outlined below.      Past medical history significant for: Chest pain/dyspnea. Echo 12/10/2023: EF 60 to 65%.  No RWMA.  Grade I DD.  Normal RV size/function.  No significant valvular abnormalities. Coronary CTA 12/13/2023: Coronary calcium  score 0 with no evidence of CAD. Hypertension. GERD. Parkinson's. TIA.  In summary, patient was first evaluated by Dr. Cave on 11/28/2023 for shortness of breath, lower extremity edema, chest pressure.  Patient reported a 48-month history of dyspnea with and without exertion.  She noted lower extremity edema approximately 4 months ago.  Patient reported neurologist felt amantadine was causing the edema so it was discontinued resulting in improvement of symptoms.  She reported occasional chest pain not associated with exertion.  She underwent echo and coronary CTA both of which were normal.     History of Present Illness    Today, patient reports continued DOE unchanged from previous. She has not had any further lower extremity edema since stopping amantadine. She reports she has not exercised regularly since the weather has been so hot. She was walking for exercise previously. She continues to do yard work when able. She also performs some home exercises she learned from her neurologist. She has baseline hand tremor. She does have some gait imbalance but does not need a cane for assistance.  Discussed results of echo and CTA in detail. All questions answered.     ROS: All other systems reviewed and are otherwise negative except as noted in History of Present Illness.  EKGs/Labs Reviewed       EKG is not ordered  today.   08/23/2023: ALT 14; AST 13 11/28/2023: BUN 16; Creatinine, Ser 0.79; Potassium 4.6; Sodium 142   08/23/2023: Hemoglobin 15.4; WBC 7.6   08/23/2023: Brain Natriuretic Peptide 13    Physical Exam    VS:  BP 120/80 (BP Location: Left Arm, Patient Position: Sitting, Cuff Size: Normal)   Pulse 94   Ht 5' 2 (1.575 m)   Wt 186 lb (84.4 kg)   SpO2 100%   BMI 34.02 kg/m  , BMI Body mass index is 34.02 kg/m.  GEN: Well nourished, well developed, in no acute distress. Neck: No JVD or carotid bruits. Cardiac:  RRR.  No murmur. No rubs or gallops.   Respiratory:  Respirations regular and unlabored. Clear to auscultation without rales, wheezing or rhonchi. GI: Soft, nontender, nondistended. Extremities: Radials/DP/PT 2+ and equal bilaterally. No clubbing or cyanosis. No edema.  Skin: Warm and dry, no rash. Neuro: Strength intact.  Assessment & Plan   Chest pain/dyspnea Echo July 2025 showed normal LV/RV function, Grade I DD, no significant valvular abnormalities.  Coronary CTA July 2025 with calcium  score of 0 with no evidence of CAD.  Patient reports continued DOE unchanged from previous. She states she has not been exercising regularly since it has been so hot. She does some yard work. Discussed results of echo and CTA. Discussed the possibility symptoms could be related to deconditioning.  - Increase physical activity as tolerated.  - Continue to monitor symptoms.  Hypertension BP today 120/80. No report of headaches or dizziness.  Continue HCTZ.  Disposition: Return in 1 year or sooner as needed.          Signed, Barnie HERO. Cordelle Dahmen, DNP, NP-C

## 2024-01-28 ENCOUNTER — Ambulatory Visit: Attending: Student | Admitting: Student

## 2024-01-28 ENCOUNTER — Encounter: Payer: Self-pay | Admitting: Student

## 2024-01-28 VITALS — BP 120/80 | HR 94 | Ht 62.0 in | Wt 186.0 lb

## 2024-01-28 DIAGNOSIS — R0609 Other forms of dyspnea: Secondary | ICD-10-CM | POA: Diagnosis not present

## 2024-01-28 DIAGNOSIS — I10 Essential (primary) hypertension: Secondary | ICD-10-CM

## 2024-01-28 DIAGNOSIS — R079 Chest pain, unspecified: Secondary | ICD-10-CM

## 2024-01-28 NOTE — Patient Instructions (Signed)
 Medication Instructions:  Your physician recommends that you continue on your current medications as directed. Please refer to the Current Medication list given to you today.   *If you need a refill on your cardiac medications before your next appointment, please call your pharmacy*  Lab Work: None ordered at this time  If you have labs (blood work) drawn today and your tests are completely normal, you will receive your results only by: MyChart Message (if you have MyChart) OR A paper copy in the mail If you have any lab test that is abnormal or we need to change your treatment, we will call you to review the results.  Testing/Procedures: None ordered at this time   Follow-Up: At [facility], you and your health needs are our priority.  As part of our continuing mission to provide you with exceptional heart care, our providers are all part of one team.  This team includes your primary Cardiologist (physician) and Advanced Practice Providers or APPs (Physician Assistants and Nurse Practitioners) who all work together to provide you with the care you need, when you need it.  Your next appointment:   1 year(s)  Provider:   Redell Cave, MD or Barnie Hila, NP    We recommend signing up for the patient portal called MyChart.  Sign up information is provided on this After Visit Summary.  MyChart is used to connect with patients for Virtual Visits (Telemedicine).  Patients are able to view lab/test results, encounter notes, upcoming appointments, etc.  Non-urgent messages can be sent to your provider as well.   To learn more about what you can do with MyChart, go to [email or web address].

## 2024-01-29 ENCOUNTER — Ambulatory Visit (INDEPENDENT_AMBULATORY_CARE_PROVIDER_SITE_OTHER)

## 2024-01-29 DIAGNOSIS — E538 Deficiency of other specified B group vitamins: Secondary | ICD-10-CM

## 2024-01-29 MED ORDER — CYANOCOBALAMIN 1000 MCG/ML IJ SOLN
1000.0000 ug | Freq: Once | INTRAMUSCULAR | Status: AC
  Administered 2024-01-29: 1000 ug via INTRAMUSCULAR

## 2024-01-29 NOTE — Progress Notes (Signed)
 Patient is in office today for a nurse visit for B12 Injection. Patient Injection was given in the  Right deltoid. Patient tolerated injection well.

## 2024-02-05 DIAGNOSIS — H40003 Preglaucoma, unspecified, bilateral: Secondary | ICD-10-CM | POA: Diagnosis not present

## 2024-03-06 ENCOUNTER — Ambulatory Visit

## 2024-03-06 DIAGNOSIS — E538 Deficiency of other specified B group vitamins: Secondary | ICD-10-CM | POA: Diagnosis not present

## 2024-03-06 DIAGNOSIS — H40003 Preglaucoma, unspecified, bilateral: Secondary | ICD-10-CM | POA: Diagnosis not present

## 2024-03-06 DIAGNOSIS — Z961 Presence of intraocular lens: Secondary | ICD-10-CM | POA: Diagnosis not present

## 2024-03-06 MED ORDER — CYANOCOBALAMIN 1000 MCG/ML IJ SOLN
1000.0000 ug | Freq: Once | INTRAMUSCULAR | Status: AC
  Administered 2024-03-06: 1000 ug via INTRAMUSCULAR

## 2024-03-14 DIAGNOSIS — E0590 Thyrotoxicosis, unspecified without thyrotoxic crisis or storm: Secondary | ICD-10-CM | POA: Diagnosis not present

## 2024-03-14 NOTE — Progress Notes (Signed)
 Pharmacy Quality Measure Review  This patient is appearing on a report for being at risk of failing the adherence measure for cholesterol (statin) medications this calendar year.   Medication: atorvastatin  Last fill date: 09/03/23 for 100 day supply  Medication was discontinued in 11/2023 d/t patient preference.  Patient is due for follow up with PCP later this month. Will send to admin team for rescheduling.   Cybele Maule E. Marsh, PharmD Clinical Pharmacist [facility] [phone]

## 2024-03-19 DIAGNOSIS — E042 Nontoxic multinodular goiter: Secondary | ICD-10-CM | POA: Diagnosis not present

## 2024-03-19 DIAGNOSIS — E0590 Thyrotoxicosis, unspecified without thyrotoxic crisis or storm: Secondary | ICD-10-CM | POA: Diagnosis not present

## 2024-03-19 NOTE — Progress Notes (Signed)
 Patricia Richardson is a 73 y.o. female seen in follow up. She was last seen on 09/17/2023. She was seen today by telehealth at her request.   Subclinical hyperthyroidism. Historically, she has had mildly low TSH levels in the range of 0.11 - 0.29 uIU/ml with normal levels of total T4 since 05/2020. In 03/2022, she initiated methimazole 2.5 mg daily. Her thyroid  labs have since normalized. Today she feels well. Denies heart racing. No change in weight. She has long-standing tremors which are unchanged. Takes Sinemet for Parkinsonism. Also with thyroid  nodules. A follow up neck US  on 02/28/23 showed stable b/l thyroid  nodules which were all stable. In 11/2018, she underwent ultrasound guided FNA biopsy of a right-sided 1.5 cm nodule which was benign.  She has occasional dysphagia with solids, which is a chronic issue. She has been advised that routine follow up imaging of nodules was not recommended.   Past Medical History:  Diagnosis Date  . 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 Colon polyp   . Cystocele   . Depression   . History of chicken pox   . Hyperplastic colon polyp 11/05/2015  . Hypertension   . 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 Multinodular goiter   . Subclinical hyperthyroidism      Outpatient Medications Marked as Taking for the 03/19/24 encounter (Telemedicine) with Solum, Anna Melissa, MD  Medication Sig Dispense Refill  . acetaminophen  (TYLENOL ) 500 MG tablet Take 1,000 mg by mouth every 6 (six) hours as needed    . aspirin  81 MG EC tablet Take 81 mg by mouth once daily    . carbidopa-levodopa (SINEMET) 25-100 mg tablet Take 1 tablet by mouth 4 (four) times daily 400 tablet 2  . escitalopram  oxalate (LEXAPRO ) 20 MG tablet TAKE 1 TABLET BY MOUTH AT NIGHT 100 tablet 1  . gabapentin  (NEURONTIN ) 400 MG capsule Take 1 capsule (400 mg total) by mouth 3 (three) times daily 300 capsule 2  . hydroCHLOROthiazide  (HYDRODIURIL ) 25 MG tablet Take 25 mg by mouth once daily    . meloxicam   (MOBIC ) 15 MG tablet TAKE 1 TABLET BY MOUTH ONCE  DAILY WITH FOOD 100 tablet 2  . methIMAzole (TAPAZOLE) 5 MG tablet TAKE ONE-HALF TABLET BY MOUTH  ONCE DAILY 45 tablet 3  . omeprazole  (PRILOSEC) 40 MG DR capsule Take 1 capsule (40 mg total) by mouth once daily Take 30 mins before meals 90 capsule 2    Physical Exam Ht 157.5 cm (5' 2)   Wt 83 kg (183 lb)   LMP  (LMP Unknown)   BMI 33.47 kg/m   GEN: well developed, well nourished, in NAD.    Labs Appointment on 03/14/2024  Component Date Value Ref Range Status  . Thyroid  Stimulating Hormone (TSH) 03/14/2024 1.181  0.450-5.330 uIU/ml uIU/mL Final   Reference Range for Pregnant Females >= 41 yrs old: Normal Range for 1st trimester: 0.05-3.70 ulU/ml Normal Range for 2nd trimester: 0.31-4.35 ulU/ml  . Thyroxine, Free (FT4) 03/14/2024 0.67  0.66 - 1.14 ng/dL Final    Assessment 1. Subclinical hyperthyroidism   2. Multinodular goiter     Plan - Reassured her that her hyperthyroidism is controlled. Hyperthyroidism likely due to toxic thyroid  nodule(s).  - Continue low-dose methimazole.  - Follow-up in 6 months with repeat TSH level prior. ----------   This video encounter was conducted with the patient's (or proxy's) verbal consent via secure, interactive audio and video telecommunications while away from clinic/office/hospital.  The patient (or proxy) was instructed to have  this encounter in a suitably private space and to only have persons present to whom they give permission to participate. In addition, patient identity was confirmed by use of name plus an additional identifier.  This visit was coded based on medical decision making (MDM).

## 2024-03-20 ENCOUNTER — Other Ambulatory Visit: Payer: Self-pay | Admitting: Nurse Practitioner

## 2024-03-20 DIAGNOSIS — Z1231 Encounter for screening mammogram for malignant neoplasm of breast: Secondary | ICD-10-CM

## 2024-03-26 ENCOUNTER — Ambulatory Visit: Admitting: Nurse Practitioner

## 2024-03-26 ENCOUNTER — Encounter: Payer: Self-pay | Admitting: Nurse Practitioner

## 2024-03-26 VITALS — BP 134/82 | HR 74 | Temp 98.1°F | Ht 62.0 in | Wt 189.0 lb

## 2024-03-26 DIAGNOSIS — Z23 Encounter for immunization: Secondary | ICD-10-CM | POA: Diagnosis not present

## 2024-03-26 DIAGNOSIS — Z130 Encounter for screening for diseases of the blood and blood-forming organs and certain disorders involving the immune mechanism: Secondary | ICD-10-CM

## 2024-03-26 DIAGNOSIS — I739 Peripheral vascular disease, unspecified: Secondary | ICD-10-CM

## 2024-03-26 DIAGNOSIS — K219 Gastro-esophageal reflux disease without esophagitis: Secondary | ICD-10-CM | POA: Diagnosis not present

## 2024-03-26 DIAGNOSIS — E782 Mixed hyperlipidemia: Secondary | ICD-10-CM

## 2024-03-26 DIAGNOSIS — I10 Essential (primary) hypertension: Secondary | ICD-10-CM

## 2024-03-26 DIAGNOSIS — Z13220 Encounter for screening for lipoid disorders: Secondary | ICD-10-CM

## 2024-03-26 DIAGNOSIS — M1611 Unilateral primary osteoarthritis, right hip: Secondary | ICD-10-CM | POA: Diagnosis not present

## 2024-03-26 DIAGNOSIS — E042 Nontoxic multinodular goiter: Secondary | ICD-10-CM

## 2024-03-26 DIAGNOSIS — W5503XA Scratched by cat, initial encounter: Secondary | ICD-10-CM | POA: Diagnosis not present

## 2024-03-26 DIAGNOSIS — Z13820 Encounter for screening for osteoporosis: Secondary | ICD-10-CM | POA: Diagnosis not present

## 2024-03-26 DIAGNOSIS — E669 Obesity, unspecified: Secondary | ICD-10-CM

## 2024-03-26 DIAGNOSIS — R5383 Other fatigue: Secondary | ICD-10-CM

## 2024-03-26 DIAGNOSIS — E538 Deficiency of other specified B group vitamins: Secondary | ICD-10-CM

## 2024-03-26 DIAGNOSIS — G252 Other specified forms of tremor: Secondary | ICD-10-CM

## 2024-03-26 DIAGNOSIS — R0602 Shortness of breath: Secondary | ICD-10-CM | POA: Diagnosis not present

## 2024-03-26 DIAGNOSIS — L853 Xerosis cutis: Secondary | ICD-10-CM

## 2024-03-26 DIAGNOSIS — Z8659 Personal history of other mental and behavioral disorders: Secondary | ICD-10-CM

## 2024-03-26 DIAGNOSIS — R7303 Prediabetes: Secondary | ICD-10-CM | POA: Diagnosis not present

## 2024-03-26 DIAGNOSIS — Z8673 Personal history of transient ischemic attack (TIA), and cerebral infarction without residual deficits: Secondary | ICD-10-CM

## 2024-03-26 DIAGNOSIS — G20C Parkinsonism, unspecified: Secondary | ICD-10-CM

## 2024-03-26 NOTE — Assessment & Plan Note (Signed)
 Continue taking Omeprazole  20 mg every other day. Denies any chest pain or acid reflux symptoms. Avoid spicy, acidic, high-fat, and fried foods.

## 2024-03-26 NOTE — Assessment & Plan Note (Signed)
 Continue taking Carbidopa-levodopa 25-100 mg QID. Followed by neurology.

## 2024-03-26 NOTE — Assessment & Plan Note (Signed)
 Last TSH was normal. Continue taking Tapazole 5 mg daily. Followed by endocrinology.

## 2024-03-26 NOTE — Assessment & Plan Note (Signed)
 Denies any side effects including numbness, tingling, or cramping in legs. Followed by cardiology.

## 2024-03-26 NOTE — Assessment & Plan Note (Signed)
 Continue taking Carbidopa-levodopa 25-100 mg QID. Has resting hand tremor due to Parkinson's. Followed by neurology.

## 2024-03-26 NOTE — Assessment & Plan Note (Signed)
 Labs ordered.

## 2024-03-26 NOTE — Progress Notes (Signed)
 BP 134/82   Pulse 74   Temp 98.1 F (36.7 C)   Ht 5' 2 (1.575 m)   Wt 189 lb (85.7 kg)   SpO2 99%   BMI 34.57 kg/m    Subjective:    Patient ID: Patricia Richardson, female    DOB: 31-Dec-1950, 73 y.o.   MRN: [id number]  HPI: GREENLY Patricia Richardson is a 73 y.o. female  Chief Complaint  Patient presents with   Follow-up    Pt c/o dry skin near eyes and cat scratches on right leg. Would like to discuss RSV and tetanus vaccine.     Hypertension, Lower Extremity Edema, and Hx of PAD: -Medications: Hydrochlorothiazide  25 mg daily  -Patient is compliant with above medications and reports no side effects. -Checks blood pressure at home. Blood pressure usually ranges 130/80's -Denies CP, vision changes, or symptoms of hypotension -Diet: Regular diet, acknowledges that needs to improve on eating healthier. -Exercise: Does some walking, but difficulty exercising due to chronic back pain. -Followed by cardiology. Per cardiologist, Echo performed in July 2025 showed normal LV/RV function, Grade I DD, no significant valvular abnormalities. Coronary CTA performed in July 2025 with calcium  score of 0 with no evidence of CAD. -She reports improvement in lower extremity edema since discontinuing the amlodipine .   Dyspnea: -Endorses chronic shortness of breath with walking even if just ambulating short distances or when doing activities around the house. In addition, she reports feeling fatigued. -has been cleared by cardiology  Parkinsonism  -Medications: Carbidopa-levodopa 25-100 mg QID, Gabapentin  400 mg QID -Has hand tremors at baseline -Followed by neurology  GERD: -Medications: Omeprazole  20 mg every other day -Patient is compliant with above medications and reports no side effects. -Denies any chest pain or acid reflux symptoms.  Anxiety: -Medications: Lexapro  10 mg daily at bedtime -Patient is compliant with above medications and reports no side effects.  Subclinical  Hyperthyroidism: -Medications: Tapazole 5 mg daily -Patient is compliant with above medications and reports no side effects. -Followed by endocrinology -Last TSH was 1.181 on 03/14/2024  Vitamin B12 deficiency: -Medications: Vitamin B12 injections -Continues to feel fatigued.  Primary Osteoarthritis of hip: -Medications: Meloxicam  15 mg daily -Patient is compliant with above medications and reports no side effects. -Endorses chronic hip and lower back pain. Discussed with patient about working with physical therapy. -Had hip replacement in 2019.  History of CVA: -Medications: Aspirin  81 mg daily -Patient is compliant with above medications and reports no side effects  Cat scratches: -Last week the patient reports being scratched by a cat in her right upper thigh. The cat had sharp claws and scratched her through her jeans. Denies any redness, swelling, or signs of infection. Requests tetanus vaccine due to recent scratches.  Dry skin: -Patient endorses dry skin around her eyes. She reports using a facial cream. Encouraged to continue to use a facial moisturizer.   PHQ-9 is negative for depression.    03/26/2024    9:06 AM 04/12/2023   10:16 AM 03/26/2023    9:52 AM  Depression screen PHQ 2/9  Decreased Interest 0 0 0  Down, Depressed, Hopeless 0 0 0  PHQ - 2 Score 0 0 0  Altered sleeping  0   Tired, decreased energy  0   Change in appetite  0   Feeling bad or failure about yourself   0   Trouble concentrating  0   Moving slowly or fidgety/restless  0   Suicidal thoughts  0   PHQ-9  Score  0   Difficult doing work/chores  Not difficult at all     GAD-7 is negative for anxiety.    03/26/2024    9:25 AM 12/26/2021    1:59 PM 10/27/2021    3:11 PM 03/14/2021    1:37 PM  GAD 7 : Generalized Anxiety Score  Nervous, Anxious, on Edge 0 0 0 0  Control/stop worrying 0 0 0 0  Worry too much - different things 0 0 0 0  Trouble relaxing 0 0 0 0  Restless 2 0 0 0  Easily  annoyed or irritable 0 0 0 0  Afraid - awful might happen 0 0 0 0  Total GAD 7 Score 2 0 0 0  Anxiety Difficulty Not difficult at all   Not difficult at all     Relevant past medical, surgical, family and social history reviewed and updated as indicated. Interim medical history since our last visit reviewed. Allergies and medications reviewed and updated.  Review of Systems  Constitutional: Negative for fever or weight change.  Respiratory: Negative for cough. Endorses shortness of breath.   Cardiovascular: Negative for chest pain or palpitations.  Gastrointestinal: Negative for abdominal pain, no bowel changes.  Musculoskeletal: Negative for gait problem or joint swelling.  Skin: Negative for rash. Endorses recent cat scratches on right upper thigh. Neurological: Negative for dizziness or headache.  No other specific complaints in a complete review of systems (except as listed in HPI above).     Objective:     BP 134/82   Pulse 74   Temp 98.1 F (36.7 C)   Ht 5' 2 (1.575 m)   Wt 189 lb (85.7 kg)   SpO2 99%   BMI 34.57 kg/m    Wt Readings from Last 3 Encounters:  03/26/24 189 lb (85.7 kg)  01/28/24 186 lb (84.4 kg)  11/28/23 184 lb 6.4 oz (83.6 kg)    Flowsheet Row Office Visit from 03/26/2024 in [facility]  1 42 inches     Physical Exam Constitutional:      Appearance: Normal appearance.  Cardiovascular:     Rate and Rhythm: Normal rate and regular rhythm.     Heart sounds: Normal heart sounds.  Pulmonary:     Effort: Pulmonary effort is normal.     Breath sounds: Normal breath sounds.     Comments: Dyspnea with exertion. Walking pulse ox dropped to 78%. Skin:    General: Skin is warm and dry.     Comments: Small healing scratches on right upper thigh. No erythema, warmth, edema, or drainage.  Neurological:     General: No focal deficit present.     Mental Status: She is alert and oriented to person, place, and time. Mental  status is at baseline.  Psychiatric:        Mood and Affect: Mood normal.        Behavior: Behavior normal.        Thought Content: Thought content normal.        Judgment: Judgment normal.     Results for orders placed or performed in visit on 03/26/24  Extra   Collection Time: 03/26/24 10:06 AM  Result Value Ref Range   EXTRA TUBE RECEIVED     SPECIMEN TYPE RECEIVED: SST           Assessment & Plan:   Problem List Items Addressed This Visit       Cardiovascular and Mediastinum   Essential hypertension  Continue taking Hydrochlorothiazide  25 mg daily. Amlodipine  previously discontinued due to worsening lower extremity edema. BP in normal range. Denies any side effects. Followed by cardiology. Labs ordered.      Relevant Orders   Comprehensive metabolic panel with GFR   Microalbumin / creatinine urine ratio   PAD (peripheral artery disease)   Denies any side effects including numbness, tingling, or cramping in legs. Followed by cardiology.        Digestive   Gastroesophageal reflux disease without esophagitis   Continue taking Omeprazole  20 mg every other day. Denies any chest pain or acid reflux symptoms. Avoid spicy, acidic, high-fat, and fried foods.        Endocrine   Multiple thyroid  nodules   Last TSH was normal. Continue taking Tapazole 5 mg daily. Followed by endocrinology.        Nervous and Auditory   Parkinsonism (HCC)   Continue taking Carbidopa-levodopa 25-100 mg QID. Followed by neurology.        Musculoskeletal and Integument   Primary osteoarthritis of right hip   Endorses chronic hip and lower back pain. Takes Meloxicam  15 mg daily. Physical therapy consult ordered.         Other   Hx of completed stroke (Chronic)   Continue taking Aspirin  81 mg daily.       Obesity (BMI 35.0-39.9 without comorbidity) (Chronic)   Educated on consuming a healthy diet including a variety of vegetables, fruits, and whole grains. In addition, perform  150 minutes of moderate intensity exercises weekly with 2 days of weight bearing exercises.      Prediabetes   Labs ordered.      Relevant Orders   Comprehensive metabolic panel with GFR   Hemoglobin A1c   Resting tremor   Continue taking Carbidopa-levodopa 25-100 mg QID. Has resting hand tremor due to Parkinson's. Followed by neurology.      Mixed hyperlipidemia   Labs ordered.      History of anxiety   Continue taking Lexapro  10 mg daily. Denies any recent anxiety episodes      B12 deficiency   Receives B12 injections. Labs ordered.      Relevant Orders   B12 and Folate Panel   Other Visit Diagnoses       Need for influenza vaccination    -  Primary   Relevant Orders   Flu vaccine HIGH DOSE PF(Fluzone Trivalent) (Completed)     Screening for hyperlipidemia       Relevant Orders   Lipid panel     Screening for deficiency anemia       Labs ordered.   Relevant Orders   CBC with Differential/Platelet     Other fatigue       Labs ordered.   Relevant Orders   CBC with Differential/Platelet   Comprehensive metabolic panel with GFR   B12 and Folate Panel   Iron, TIBC and Ferritin Panel   VITAMIN D  25 Hydroxy (Vit-D Deficiency, Fractures)     Screening for osteoporosis       Relevant Orders   DG Bone Density     Cat scratch       Healed scratches on right upper thigh. No erythema, edema, or drainage. Educated on warning signs of infections and when to seek medical care. Tetanus ordered.   Relevant Orders   Tdap vaccine greater than or equal to 7yo IM (Completed)     Exertional shortness of breath       Dyspnea with exertion. Walking  pulse ox dropped to 78%. Pulmonlogy referral ordered for pulmonary function test. Followed by cardiology. PT consult ordered.   Relevant Orders   Ambulatory referral to Pulmonology   Ambulatory referral to Physical Therapy     Dry skin       Endorses dry skin around eyes. Reports using a facial cream. Encouraged to continue to use a  daily facial moisturizer.            Follow up plan: Return in about 6 months (around 09/24/2024) for follow up.    I have reviewed this encounter including the documentation in this note and/or discussed this patient with the provider, Alexa Everhart SNP, I am certifying that I agree with the content of this note as supervising/preceptor nurse practitioner.  Mliss Spray, FNP-C [facility] 03/26/2024, 11:46 AM

## 2024-03-26 NOTE — Assessment & Plan Note (Addendum)
 Continue taking Hydrochlorothiazide  25 mg daily. Amlodipine  previously discontinued due to worsening lower extremity edema. BP in normal range. Denies any side effects. Followed by cardiology. Labs ordered.

## 2024-03-26 NOTE — Assessment & Plan Note (Signed)
 Continue taking Lexapro  10 mg daily. Denies any recent anxiety episodes

## 2024-03-26 NOTE — Assessment & Plan Note (Signed)
 Educated on consuming a healthy diet including a variety of vegetables, fruits, and whole grains. In addition, perform 150 minutes of moderate intensity exercises weekly with 2 days of weight bearing exercises.

## 2024-03-26 NOTE — Assessment & Plan Note (Signed)
 Receives B12 injections. Labs ordered.

## 2024-03-26 NOTE — Assessment & Plan Note (Signed)
 Endorses chronic hip and lower back pain. Takes Meloxicam  15 mg daily. Physical therapy consult ordered.

## 2024-03-26 NOTE — Assessment & Plan Note (Signed)
 Continue taking Aspirin 81mg  daily.

## 2024-03-27 ENCOUNTER — Ambulatory Visit: Payer: Self-pay | Admitting: Nurse Practitioner

## 2024-03-27 LAB — COMPREHENSIVE METABOLIC PANEL WITH GFR
AG Ratio: 1.8 (calc) (ref 1.0–2.5)
ALT: 20 U/L (ref 6–29)
AST: 19 U/L (ref 10–35)
Albumin: 4.6 g/dL (ref 3.6–5.1)
Alkaline phosphatase (APISO): 73 U/L (ref 37–153)
BUN: 11 mg/dL (ref 7–25)
CO2: 24 mmol/L (ref 20–32)
Calcium: 9.9 mg/dL (ref 8.6–10.4)
Chloride: 106 mmol/L (ref 98–110)
Creat: 0.82 mg/dL (ref 0.60–1.00)
Globulin: 2.6 g/dL (ref 1.9–3.7)
Glucose, Bld: 89 mg/dL (ref 65–99)
Potassium: 4 mmol/L (ref 3.5–5.3)
Sodium: 143 mmol/L (ref 135–146)
Total Bilirubin: 0.6 mg/dL (ref 0.2–1.2)
Total Protein: 7.2 g/dL (ref 6.1–8.1)
eGFR: 75 mL/min/1.73m2 (ref >=60)

## 2024-03-27 LAB — EXTRA

## 2024-03-27 LAB — IRON,TIBC AND FERRITIN PANEL
%SAT: 39 % (ref 16–45)
Ferritin: 67 ng/mL (ref 16–288)
Iron: 120 ug/dL (ref 45–288)
TIBC: 305 ug/dL (ref 250–450)

## 2024-03-27 LAB — CBC WITH DIFFERENTIAL/PLATELET
Absolute Lymphocytes: 1252 {cells}/uL (ref 850–3900)
Absolute Monocytes: 291 {cells}/uL (ref 200–950)
Basophils Absolute: 31 {cells}/uL (ref 0–200)
Basophils Relative: 0.5 %
Eosinophils Absolute: 99 {cells}/uL (ref 15–500)
Eosinophils Relative: 1.6 %
HCT: 46.6 % — ABNORMAL HIGH (ref 35.0–45.0)
Hemoglobin: 15.5 g/dL (ref 11.7–15.5)
MCH: 30.3 pg (ref 27.0–33.0)
MCHC: 33.3 g/dL (ref 32.0–36.0)
MCV: 91 fL (ref 80.0–100.0)
MPV: 10.7 fL (ref 7.5–12.5)
Monocytes Relative: 4.7 %
Neutro Abs: 4526 {cells}/uL (ref 1500–7800)
Neutrophils Relative %: 73 %
Platelets: 339 Thousand/uL (ref 140–400)
RBC: 5.12 Million/uL — ABNORMAL HIGH (ref 3.80–5.10)
RDW: 12.5 % (ref 11.0–15.0)
Total Lymphocyte: 20.2 %
WBC: 6.2 Thousand/uL (ref 3.8–10.8)

## 2024-03-27 LAB — LIPID PANEL
Cholesterol: 176 mg/dL (ref <200)
HDL: 56 mg/dL (ref >=50)
LDL Cholesterol (Calc): 99 mg/dL
Non-HDL Cholesterol (Calc): 120 mg/dL (ref <130)
Total CHOL/HDL Ratio: 3.1 (calc) (ref <5.0)
Triglycerides: 110 mg/dL (ref <150)

## 2024-03-27 LAB — VITAMIN D 25 HYDROXY (VIT D DEFICIENCY, FRACTURES): Vit D, 25-Hydroxy: 36 ng/mL (ref 30–100)

## 2024-03-27 LAB — MICROALBUMIN / CREATININE URINE RATIO
Creatinine, Urine: 89 mg/dL (ref 20–275)
Microalb Creat Ratio: 2 mg/g{creat}
Microalb, Ur: 0.2 mg/dL

## 2024-03-27 LAB — B12 AND FOLATE PANEL
Folate: 14 ng/mL
Vitamin B-12: 706 pg/mL (ref 200–1100)

## 2024-03-27 LAB — HEMOGLOBIN A1C
Hgb A1c MFr Bld: 5.9 % — ABNORMAL HIGH (ref <5.7)
Mean Plasma Glucose: 123 mg/dL
eAG (mmol/L): 6.8 mmol/L

## 2024-04-07 NOTE — Telephone Encounter (Signed)
 Copied from CRM [id number]. Topic: General - Other >> Apr 07, 2024 12:02 PM Zebedee SAUNDERS wrote: Reason for CRM: Receive call from [facility] sent fax on Wed. Oct. 29th for plan of care. Please complete and fax [phone].

## 2024-04-08 ENCOUNTER — Ambulatory Visit

## 2024-04-08 DIAGNOSIS — E538 Deficiency of other specified B group vitamins: Secondary | ICD-10-CM | POA: Diagnosis not present

## 2024-04-08 MED ORDER — CYANOCOBALAMIN 1000 MCG/ML IJ SOLN
1000.0000 ug | Freq: Once | INTRAMUSCULAR | Status: AC
  Administered 2024-04-08: 1000 ug via INTRAMUSCULAR

## 2024-04-17 ENCOUNTER — Ambulatory Visit: Payer: Medicare Other

## 2024-04-17 DIAGNOSIS — Z0000 Encounter for general adult medical examination without abnormal findings: Secondary | ICD-10-CM | POA: Diagnosis not present

## 2024-04-17 NOTE — Progress Notes (Signed)
 No chief complaint on file.    Subjective:   Patricia Richardson is a 73 y.o. female who presents for a Medicare Annual Wellness Visit.  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/2001   Past Surgical History:  Procedure Laterality Date   ABDOMINAL HYSTERECTOMY     BREAST BIOPSY Right 11/03/2005   core biopsy w/ clip placement   CATARACT EXTRACTION W/PHACO Right 09/15/2020   Procedure: CATARACT EXTRACTION PHACO AND INTRAOCULAR LENS PLACEMENT (IOC) RIGHT;  Surgeon: Mittie Gaskin, MD;  Location: [facility];  Service: Ophthalmology;  Laterality: Right;  4.70 0:57.4 8.2%   CATARACT EXTRACTION W/PHACO Left 09/29/2020   Procedure: CATARACT EXTRACTION PHACO AND INTRAOCULAR LENS PLACEMENT (IOC) LEFT 3.01 00:45.7 6.6%;  Surgeon: Mittie Gaskin, MD;  Location: [facility];  Service: Ophthalmology;  Laterality: Left;   CHOLECYSTECTOMY     COLONOSCOPY     COLONOSCOPY N/A 10/11/2023   Procedure: COLONOSCOPY;  Surgeon: Onita Elspeth Sharper, DO;  Location: [facility];  Service: Gastroenterology;  Laterality: N/A;   COLONOSCOPY WITH PROPOFOL  N/A 11/05/2015   Procedure: COLONOSCOPY WITH PROPOFOL ;  Surgeon: Gladis RAYMOND Mariner, MD;  Location: [facility];  Service: Endoscopy;  Laterality: N/A;   COLONOSCOPY WITH PROPOFOL  N/A 04/21/2022   Procedure: COLONOSCOPY WITH PROPOFOL ;  Surgeon: Onita Elspeth Sharper, DO;  Location: [facility] ENDOSCOPY;  Service: Gastroenterology;  Laterality: N/A;   ESOPHAGOGASTRODUODENOSCOPY (EGD) WITH PROPOFOL  N/A 04/21/2022   Procedure: ESOPHAGOGASTRODUODENOSCOPY (EGD) WITH PROPOFOL ;  Surgeon: Onita Elspeth Sharper, DO;  Location: [facility] END[facility]COPY;  Service:  Gastroenterology;  Laterality: N/A;   JOINT REPLACEMENT  04-09-2018   KNEE ARTHROSCOPY Right 02/21/2008   POLYPECTOMY  10/11/2023   Procedure: POLYPECTOMY, INTESTINE;  Surgeon: Onita Elspeth Sharper, DO;  Location: [facility] END[facility];  Service: Gastroenterology;;   TOTAL HIP ARTHROPLASTY Right 04/09/2018   Procedure: TOTAL HIP ARTHROPLASTY ANTERIOR APPROACH;  Surgeon: Kathlynn Sharper, MD;  Location: [facility] ORS;  Service: Orthopedics;  Laterality: Right;   WISDOM TOOTH EXTRACTION     Family History  Problem Relation Age of Onset   Kidney disease Mother    Diabetes Mother    Drug abuse Mother    Heart attack Father    Hypertension Father    Kidney disease Father    Diabetes Brother    Breast cancer Maternal Aunt        great   Diabetes Paternal Grandmother    Social History   Occupational History   Not on file  Tobacco Use   Smoking status: Never   Smokeless tobacco: Never  Vaping Use   Vaping status: Never Used  Substance and Sexual Activity   Alcohol use: Yes    Comment: rarely   Drug use: No   Sexual activity: Not Currently   Tobacco Counseling Counseling given: Not Answered  SDOH Screenings   Food Insecurity: No Food Insecurity (03/22/2024)  Housing: Unknown (03/22/2024)  Transportation Needs: No Transportation Needs (03/22/2024)  Utilities: Not At Risk (03/26/2024)  Alcohol Screen: Low Risk  (03/26/2024)  Depression (PHQ2-9): Low Risk  (03/26/2024)  Financial Resource Strain: Low Risk  (03/22/2024)  Physical Activity: Insufficiently Active (03/22/2024)  Social Connections: Moderately Isolated (03/22/2024)  Stress: No Stress Concern Present (03/22/2024)  Tobacco Use: Low Risk  (03/19/2024)   Received from [facility]  Health Literacy: Adequate Health Literacy (03/26/2024)   See flowsheets for full screening details  Depression Screen PHQ 2 & 9 Depression Scale- Over the past 2 weeks, how often have you been bothered by any of the following  problems? Little interest or pleasure in doing things: 0 Feeling down, depressed, or hopeless (PHQ Adolescent also includes...irritable): 0 PHQ-2 Total Score: 0     Goals Addressed   None    Fall Screening Falls in the past year?: 0 Number of falls in past year: 0 Was there an injury with Fall?: 0 Fall Risk Category Calculator: 0 Patient Fall Risk Level: Low Fall Risk  Fall Risk Fall risk Follow up: Falls evaluation completed  Advance Directives (For Healthcare) Does Patient Have a Medical Advance Directive?: Yes Type of Advance Directive: Living will        Objective:    There were no vitals filed for this visit. There is no height or weight on file to calculate BMI.  Current Medications (verified) Outpatient Encounter Medications as of 04/17/2024  Medication Sig   aspirin  EC 81 MG tablet Take 1 tablet (81 mg total) by mouth daily.   carbidopa-levodopa (SINEMET IR) 25-100 MG tablet Take 1 tablet by mouth 4 (four) times daily.   escitalopram  (LEXAPRO ) 10 MG tablet Take 10 mg by mouth at bedtime. 2 at night   gabapentin  (NEURONTIN ) 100 MG capsule Take 400 mg by mouth 4 (four) times daily.   hydrochlorothiazide  (HYDRODIURIL ) 25 MG tablet Take 1 tablet (25 mg total) by mouth daily.   meloxicam  (MOBIC ) 15 MG tablet Take 15 mg by mouth daily.   methimazole (TAPAZOLE) 5 MG tablet Take by mouth.   omeprazole  (PRILOSEC) 20 MG capsule TAKE 1 CAPSULE BY MOUTH EVERY  OTHER DAY   OVER THE COUNTER MEDICATION Take 1 tablet by mouth daily as needed (constipation). Swiss Kriss herbal laxative   No facility-administered encounter medications on file as of 04/17/2024.   Hearing/Vision screen No results found. Immunizations and Health Maintenance Health Maintenance  Topic Date Due   Zoster Vaccines- Shingrix (1 of 2) 06/26/1967   COVID-19 Vaccine (3 - Pfizer risk series) 09/23/2019   DEXA SCAN  03/11/2024   Medicare Annual Wellness (AWV)  04/11/2024   Mammogram  04/19/2024    Colonoscopy  10/11/2026   DTaP/Tdap/Td (3 - Td or Tdap) 03/26/2034   Pneumococcal Vaccine: 50+ Years  Completed   Influenza Vaccine  Completed   Hepatitis C Screening  Completed   Meningococcal B Vaccine  Aged Out        Assessment/Plan:  This is a routine wellness examination for [place].  Patient Care Team: Gareth Mliss FALCON, FNP as PCP - General (Nurse Practitioner) Darliss Rogue, MD as PCP - Cardiology (Cardiology) Ashley Soulier, DPM as Referring Physician (Podiatry) Lane Arthea BRAVO, MD as Referring Physician (Neurology) Mittie Gaskin, MD as Referring Physician (Ophthalmology)  I have personally reviewed and noted the following in the patient's chart:   Medical and social history Use of alcohol, tobacco or illicit drugs  Current medications and supplements including opioid prescriptions. Functional ability and status Nutritional status Physical activity Advanced directives List of other physicians Hospitalizations, surgeries, and ER visits in previous 12 months Vitals Screenings to include cognitive, depression, and falls Referrals and appointments  No orders of the defined types were placed in this encounter.  In addition, I have reviewed  and discussed with patient certain preventive protocols, quality metrics, and best practice recommendations. A written personalized care plan for preventive services as well as general preventive health recommendations were provided to patient.   Jhonnie GORMAN Das, LPN   [id number]/7974   No follow-ups on file.  After Visit Summary: (MyChart) Due to this being a telephonic visit, the after visit summary with patients personalized plan was offered to patient via MyChart   Nurse Notes: UTD ON SHOTS; MAMMOGRAM SCHEDULED FOR NEXT WEEK; UTD ON COLONOSCOPY; BDS SCHEDULED FOR FRANCINA 2026

## 2024-04-17 NOTE — Patient Instructions (Addendum)
 Patricia Richardson,  Thank you for taking the time for your Medicare Wellness Visit. I appreciate your continued commitment to your health goals. Please review the care plan we discussed, and feel free to reach out if I can assist you further.  Please note that Annual Wellness Visits do not include a physical exam. Some assessments may be limited, especially if the visit was conducted virtually. If needed, we may recommend an in-person follow-up with your provider.  Ongoing Care Seeing your primary care provider every 3 to 6 months helps us  monitor your health and provide consistent, personalized care.   Referrals If a referral was made during today's visit and you haven't received any updates within two weeks, please contact the referred provider directly to check on the status.  Recommended Screenings:  Health Maintenance  Topic Date Due   Zoster (Shingles) Vaccine (1 of 2) 06/26/1967   COVID-19 Vaccine (3 - Pfizer risk series) 09/23/2019   DEXA scan (bone density measurement)  03/11/2024   Breast Cancer Screening  04/19/2024   Medicare Annual Wellness Visit  04/17/2025   Colon Cancer Screening  10/11/2026   DTaP/Tdap/Td vaccine (3 - Td or Tdap) 03/26/2034   Pneumococcal Vaccine for age over 86  Completed   Flu Shot  Completed   Hepatitis C Screening  Completed   Meningitis B Vaccine  Aged Out     Vision: Annual vision screenings are recommended for early detection of glaucoma, cataracts, and diabetic retinopathy. These exams can also reveal signs of chronic conditions such as diabetes and high blood pressure.  Dental: Annual dental screenings help detect early signs of oral cancer, gum disease, and other conditions linked to overall health, including heart disease and diabetes.  Please see the attached documents for additional preventive care recommendations.   NEXT AWV  04/23/25 @ 10:50 AM BY VIDEO Take care & I will see ya next year/Patricia Richardson

## 2024-04-18 ENCOUNTER — Ambulatory Visit: Payer: Self-pay

## 2024-04-18 NOTE — Telephone Encounter (Signed)
 Lvm informing pt.

## 2024-04-18 NOTE — Telephone Encounter (Signed)
  FYI Only or Action Required?: Action required by provider: medication refill request and clinical question for provider.  Patient was last seen in primary care on 03/26/2024 by Gareth Mliss FALCON, FNP.  Called Nurse Triage reporting Back Pain.  Symptoms began several days ago.  Interventions attempted: Prescription medications: meloxicam  and tylenol .  Symptoms are: unchanged.  Triage Disposition: See PCP When Office is Open (Within 3 Days)  Patient/caregiver understands and will follow disposition?: No, wishes to speak with PCP      Message from Hollywood T sent at 04/18/2024  9:37 AM EST  Reason for Triage: Patient asking for medication for possible sciatic nerve pain, meloxicam  is not helping-  [phone], pain going down right hip/leg level 6 pain   Reason for Disposition  [1] MODERATE back pain (e.g., interferes with normal activities) AND [2] present > 3 days  Answer Assessment - Initial Assessment Questions Patient declines appts and requesting alternative medication for pain.  Advised UC/ED if symptoms worsen.  1. ONSET: When did the pain begin? (e.g., minutes, hours, days)    tuesday 2. LOCATION: Where does it hurt? (upper, mid or lower back)      Right hip and down leg; went to PT and meloxicam  not working 3. SEVERITY: How bad is the pain?  (e.g., Scale 1-10; mild, moderate, or severe)     7/10 4. PATTERN: Is the pain constant? (e.g., yes, no; constant, intermittent)      constant 5. RADIATION: Does the pain shoot into your legs or somewhere else?     Back pain to right hip to leg 6. CAUSE:  What do you think is causing the back pain?   Chronic back pain 8. MEDICINES: What have you taken so far for the pain? (e.g., nothing, acetaminophen , NSAIDS)     meloxicam  9. NEUROLOGIC SYMPTOMS: Do you have any weakness, numbness, or problems with bowel/bladder control?     no 10. OTHER SYMPTOMS: Do you have any other symptoms? (e.g., fever, abdomen  pain, burning with urination, blood in urine)    Denies numbness/tingling/ weakness  Protocols used: Back Pain-A-AH

## 2024-04-22 ENCOUNTER — Ambulatory Visit
Admission: RE | Admit: 2024-04-22 | Discharge: 2024-04-22 | Disposition: A | Discharge status: Home / Self Care | Source: Ambulatory Visit | Attending: Nurse Practitioner | Admitting: Nurse Practitioner

## 2024-04-22 DIAGNOSIS — Z1231 Encounter for screening mammogram for malignant neoplasm of breast: Secondary | ICD-10-CM | POA: Insufficient documentation

## 2024-04-28 ENCOUNTER — Ambulatory Visit: Admitting: Student in an Organized Health Care Education/Training Program

## 2024-05-06 ENCOUNTER — Ambulatory Visit

## 2024-05-06 DIAGNOSIS — E538 Deficiency of other specified B group vitamins: Secondary | ICD-10-CM

## 2024-05-06 MED ORDER — CYANOCOBALAMIN 1000 MCG/ML IJ SOLN
1000.0000 ug | Freq: Once | INTRAMUSCULAR | Status: AC
  Administered 2024-05-06: 1000 ug via INTRAMUSCULAR

## 2024-05-07 ENCOUNTER — Other Ambulatory Visit: Payer: Self-pay | Admitting: Nurse Practitioner

## 2024-05-07 DIAGNOSIS — I10 Essential (primary) hypertension: Secondary | ICD-10-CM

## 2024-05-07 DIAGNOSIS — R609 Edema, unspecified: Secondary | ICD-10-CM

## 2024-05-10 NOTE — Telephone Encounter (Signed)
 Requested Prescriptions  Pending Prescriptions Disp Refills   hydrochlorothiazide  (HYDRODIURIL ) 25 MG tablet [Pharmacy Med Name: hydroCHLOROthiazide  25 MG Oral Tablet] 100 tablet 0    Sig: TAKE 1 TABLET BY MOUTH DAILY     Cardiovascular: Diuretics - Thiazide Passed - 05/10/2024  9:45 AM      Passed - Cr in normal range and within 180 days    Creat  Date Value Ref Range Status  03/26/2024 0.82 0.60 - 1.00 mg/dL Final   Creatinine, Urine  Date Value Ref Range Status  03/26/2024 89 20 - 275 mg/dL Final         Passed - K in normal range and within 180 days    Potassium  Date Value Ref Range Status  03/26/2024 4.0 3.5 - 5.3 mmol/L Final         Passed - Na in normal range and within 180 days    Sodium  Date Value Ref Range Status  03/26/2024 143 135 - 146 mmol/L Final  11/28/2023 142 134 - 144 mmol/L Final         Passed - Last BP in normal range    BP Readings from Last 1 Encounters:  03/26/24 134/82         Passed - Valid encounter within last 6 months    Recent Outpatient Visits           1 month ago Need for influenza vaccination   [facility] Gareth Mliss FALCON, FNP   7 months ago Essential hypertension   [facility] Gareth Mliss FALCON, FNP   8 months ago Shortness of breath   [facility] Gareth Mliss FALCON, [place]

## 2024-05-15 ENCOUNTER — Ambulatory Visit (INDEPENDENT_AMBULATORY_CARE_PROVIDER_SITE_OTHER): Admitting: Nurse Practitioner

## 2024-05-15 ENCOUNTER — Encounter: Payer: Self-pay | Admitting: Nurse Practitioner

## 2024-05-15 VITALS — BP 138/88 | HR 92 | Temp 98.0°F | Ht 62.0 in | Wt 188.0 lb

## 2024-05-15 DIAGNOSIS — J0140 Acute pansinusitis, unspecified: Secondary | ICD-10-CM | POA: Diagnosis not present

## 2024-05-15 DIAGNOSIS — J069 Acute upper respiratory infection, unspecified: Secondary | ICD-10-CM

## 2024-05-15 DIAGNOSIS — R062 Wheezing: Secondary | ICD-10-CM

## 2024-05-15 MED ORDER — BENZONATATE 100 MG PO CAPS: 100.0000 mg | ORAL_CAPSULE | Freq: Three times a day (TID) | ORAL | 0 refills | Status: DC | PRN

## 2024-05-15 MED ORDER — AZITHROMYCIN 250 MG PO TABS: ORAL_TABLET | ORAL | 0 refills | Status: AC

## 2024-05-15 MED ORDER — ALBUTEROL SULFATE HFA 108 (90 BASE) MCG/ACT IN AERS: 2.0000 | INHALATION_SPRAY | Freq: Four times a day (QID) | RESPIRATORY_TRACT | 0 refills | Status: DC | PRN

## 2024-05-15 MED ORDER — PREDNISONE 10 MG (21) PO TBPK: ORAL_TABLET | ORAL | 0 refills | Status: DC

## 2024-05-15 NOTE — Progress Notes (Signed)
 BP 138/88   Pulse 92   Temp 98 F (36.7 C)   Ht 5' 2 (1.575 m)   Wt 188 lb (85.3 kg)   SpO2 94%   BMI 34.39 kg/m    Subjective:    Patient ID: Patricia Richardson, female    DOB: Nov 29, 1950, 73 y.o.   MRN: [id number]  HPI: Patricia Richardson is a 73 y.o. female  Chief Complaint  Patient presents with   Cough    Pt c/o cough, sore throat, congestion x1 week.    Discussed the use of AI scribe software for clinical note transcription with the patient, who gave verbal consent to proceed.  History of Present Illness Patricia Richardson is a 73 year old female who presents with cough, sore throat, and congestion for one week.  Cough and upper respiratory symptoms - Cough, sore throat, and congestion for one week - Possible fever at onset last week, but no recent fever - Throat tenderness present - No nasal congestion or ear pain - Managing symptoms with codeine and Mucinex cough medicine  Dyspnea and nocturnal symptoms - Shortness of breath, especially at night when lying down - Difficulty breathing in supine position - Congestion in throat leads to coughing fits - Coughing fits cause discomfort in her back         04/17/2024   10:56 AM 03/26/2024    9:06 AM 04/12/2023   10:16 AM  Depression screen PHQ 2/9  Decreased Interest 0 0 0  Down, Depressed, Hopeless 0 0 0  PHQ - 2 Score 0 0 0  Altered sleeping 0  0  Tired, decreased energy 0  0  Change in appetite 0  0  Feeling bad or failure about yourself  0  0  Trouble concentrating 0  0  Moving slowly or fidgety/restless 0  0  Suicidal thoughts 0  0  PHQ-9 Score 0  0   Difficult doing work/chores Not difficult at all  Not difficult at all     Data saved with a previous flowsheet row definition    Relevant past medical, surgical, family and social history reviewed and updated as indicated. Interim medical history since our last visit reviewed. Allergies and medications reviewed and updated.  Review of Systems  Ten systems  reviewed and is negative except as mentioned in HPI      Objective:      BP 138/88   Pulse 92   Temp 98 F (36.7 C)   Ht 5' 2 (1.575 m)   Wt 188 lb (85.3 kg)   SpO2 94%   BMI 34.39 kg/m    Wt Readings from Last 3 Encounters:  05/15/24 188 lb (85.3 kg)  03/26/24 189 lb (85.7 kg)  01/28/24 186 lb (84.4 kg)    Physical Exam GENERAL: Alert, cooperative, well developed, no acute distress. HEENT: Normocephalic, normal oropharynx, moist mucous membranes. NECK: Cervical lymphadenopathy present. CHEST: Wheezing on auscultation. CARDIOVASCULAR: Normal heart rate and rhythm, S1 and S2 normal without murmurs. ABDOMEN: Soft, non-tender, non-distended, without organomegaly, normal bowel sounds. EXTREMITIES: No cyanosis or edema. NEUROLOGICAL: Cranial nerves grossly intact, moves all extremities without gross motor or sensory deficit.  Results for orders placed or performed in visit on 03/26/24  CBC with Differential/Platelet   Collection Time: 03/26/24 10:06 AM  Result Value Ref Range   WBC 6.2 3.8 - 10.8 Thousand/uL   RBC 5.12 (H) 3.80 - 5.10 Million/uL   Hemoglobin 15.5 11.7 - 15.5 g/dL   HCT  46.6 (H) 35.0 - 45.0 %   MCV 91.0 80.0 - 100.0 fL   MCH 30.3 27.0 - 33.0 pg   MCHC 33.3 32.0 - 36.0 g/dL   RDW 87.4 88.9 - 84.9 %   Platelets 339 140 - 400 Thousand/uL   MPV 10.7 7.5 - 12.5 fL   Neutro Abs 4,526 1,500 - 7,800 cells/uL   Absolute Lymphocytes 1,252 850 - 3,900 cells/uL   Absolute Monocytes 291 200 - 950 cells/uL   Eosinophils Absolute 99 15 - 500 cells/uL   Basophils Absolute 31 0 - 200 cells/uL   Neutrophils Relative % 73 %   Total Lymphocyte 20.2 %   Monocytes Relative 4.7 %   Eosinophils Relative 1.6 %   Basophils Relative 0.5 %  Comprehensive metabolic panel with GFR   Collection Time: 03/26/24 10:06 AM  Result Value Ref Range   Glucose, Bld 89 65 - 99 mg/dL   BUN 11 7 - 25 mg/dL   Creat 9.17 9.39 - 8.99 mg/dL   eGFR 75 > OR = 60 fO/fpw/8.26f7    BUN/Creatinine Ratio SEE NOTE: 6 - 22 (calc)   Sodium 143 135 - 146 mmol/L   Potassium 4.0 3.5 - 5.3 mmol/L   Chloride 106 98 - 110 mmol/L   CO2 24 20 - 32 mmol/L   Calcium  9.9 8.6 - 10.4 mg/dL   Total Protein 7.2 6.1 - 8.1 g/dL   Albumin 4.6 3.6 - 5.1 g/dL   Globulin 2.6 1.9 - 3.7 g/dL (calc)   AG Ratio 1.8 1.0 - 2.5 (calc)   Total Bilirubin 0.6 0.2 - 1.2 mg/dL   Alkaline phosphatase (APISO) 73 37 - 153 U/L   AST 19 10 - 35 U/L   ALT 20 6 - 29 U/L  Lipid panel   Collection Time: 03/26/24 10:06 AM  Result Value Ref Range   Cholesterol 176 <200 mg/dL   HDL 56 > OR = 50 mg/dL   Triglycerides 889 <849 mg/dL   LDL Cholesterol (Calc) 99 mg/dL (calc)   Total CHOL/HDL Ratio 3.1 <5.0 (calc)   Non-HDL Cholesterol (Calc) 120 <130 mg/dL (calc)  Hemoglobin J8r   Collection Time: 03/26/24 10:06 AM  Result Value Ref Range   Hgb A1c MFr Bld 5.9 (H) <5.7 %   Mean Plasma Glucose 123 mg/dL   eAG (mmol/L) 6.8 mmol/L  B12 and Folate Panel   Collection Time: 03/26/24 10:06 AM  Result Value Ref Range   Vitamin B-12 706 200 - 1,100 pg/mL   Folate 14.0 ng/mL  Microalbumin / creatinine urine ratio   Collection Time: 03/26/24 10:06 AM  Result Value Ref Range   Creatinine, Urine 89 20 - 275 mg/dL   Microalb, Ur 0.2 mg/dL   Microalb Creat Ratio 2 <30 mg/g creat  Iron, TIBC and Ferritin Panel   Collection Time: 03/26/24 10:06 AM  Result Value Ref Range   Iron 120 45 - 160 mcg/dL   TIBC 694 749 - 549 mcg/dL (calc)   %SAT 39 16 - 45 % (calc)   Ferritin 67 16 - 288 ng/mL  VITAMIN D  25 Hydroxy (Vit-D Deficiency, Fractures)   Collection Time: 03/26/24 10:06 AM  Result Value Ref Range   Vit D, 25-Hydroxy 36 30 - 100 ng/mL  Extra   Collection Time: 03/26/24 10:06 AM  Result Value Ref Range   EXTRA TUBE RECEIVED     SPECIMEN TYPE RECEIVED: SST           Assessment & Plan:   Problem  List Items Addressed This Visit   None Visit Diagnoses       Viral upper respiratory tract infection    -   Primary   Relevant Medications   albuterol  (VENTOLIN  HFA) 108 (90 Base) MCG/ACT inhaler   predniSONE  (STERAPRED UNI-PAK 21 TAB) 10 MG (21) TBPK tablet   azithromycin (ZITHROMAX) 250 MG tablet   benzonatate (TESSALON) 100 MG capsule     Wheezing       Relevant Medications   albuterol  (VENTOLIN  HFA) 108 (90 Base) MCG/ACT inhaler   predniSONE  (STERAPRED UNI-PAK 21 TAB) 10 MG (21) TBPK tablet   azithromycin (ZITHROMAX) 250 MG tablet   benzonatate (TESSALON) 100 MG capsule     Acute non-recurrent pansinusitis       Relevant Medications   albuterol  (VENTOLIN  HFA) 108 (90 Base) MCG/ACT inhaler   predniSONE  (STERAPRED UNI-PAK 21 TAB) 10 MG (21) TBPK tablet   azithromycin (ZITHROMAX) 250 MG tablet   benzonatate (TESSALON) 100 MG capsule        Assessment and Plan Assessment & Plan Acute upper respiratory infection with acute pansinusitis and wheezing Symptoms include cough, sore throat, congestion, and wheezing for one week. No fever currently, but had fever initially. Wheezing present, especially at night. Swollen lymph nodes. No nasal congestion or ear pain. Differential includes potential pneumonia, but lungs do not sound like pneumonia and no fever is present. Risk of dehydration due to illness. - Continue Mucinex cough medicine. - Prescribed Tessalon Perles for cough - Prescribed azithromycin for sinus infection. - Prescribed steroid to open lungs. - Prescribed inhaler for wheezing. - Advised to send a message on Monday if symptoms do not improve or worsen for potential chest x-ray. - Advised to stay hydrated to prevent dehydration.        Follow up plan: Return if symptoms worsen or fail to improve.

## 2024-05-30 ENCOUNTER — Other Ambulatory Visit: Payer: Self-pay | Admitting: Nurse Practitioner

## 2024-05-30 DIAGNOSIS — I10 Essential (primary) hypertension: Secondary | ICD-10-CM

## 2024-05-30 DIAGNOSIS — R609 Edema, unspecified: Secondary | ICD-10-CM

## 2024-05-30 NOTE — Telephone Encounter (Signed)
 Copied from CRM #[id number]. Topic: Clinical - Medication Refill >> May 30, 2024  9:52 AM Everette C wrote: Medication: hydrochlorothiazide  (HYDRODIURIL ) 25 MG tablet [[id number]]  Has the patient contacted their pharmacy? Yes (Agent: If no, request that the patient contact the pharmacy for the refill. If patient does not wish to contact the pharmacy document the reason why and proceed with request.) (Agent: If yes, when and what did the pharmacy advise?)  This is the patient's preferred pharmacy:  [facility] - [place], [place] - [address] [place] [address] Phone: [phone] Fax: [phone]  Is this the correct pharmacy for this prescription? Yes If no, delete pharmacy and type the correct one.   Has the prescription been filled recently? Yes  Is the patient out of the medication? No  Has the patient been seen for an appointment in the last year OR does the patient have an upcoming appointment? Yes  Can we respond through MyChart? No  Agent: Please be advised that Rx refills may take up to 3 business days. We ask that you follow-up with your pharmacy.

## 2024-06-02 MED ORDER — HYDROCHLOROTHIAZIDE 25 MG PO TABS: 25.0000 mg | ORAL_TABLET | Freq: Every day | ORAL | 0 refills | Status: AC

## 2024-06-02 NOTE — Telephone Encounter (Signed)
 Requested Prescriptions  Pending Prescriptions Disp Refills   hydrochlorothiazide  (HYDRODIURIL ) 25 MG tablet 100 tablet 0    Sig: Take 1 tablet (25 mg total) by mouth daily.     Cardiovascular: Diuretics - Thiazide Passed - 06/02/2024  1:04 PM      Passed - Cr in normal range and within 180 days    Creat  Date Value Ref Range Status  03/26/2024 0.82 0.60 - 1.00 mg/dL Final   Creatinine, Urine  Date Value Ref Range Status  03/26/2024 89 20 - 275 mg/dL Final         Passed - K in normal range and within 180 days    Potassium  Date Value Ref Range Status  03/26/2024 4.0 3.5 - 5.3 mmol/L Final         Passed - Na in normal range and within 180 days    Sodium  Date Value Ref Range Status  03/26/2024 143 135 - 146 mmol/L Final  11/28/2023 142 134 - 144 mmol/L Final         Passed - Last BP in normal range    BP Readings from Last 1 Encounters:  05/15/24 138/88         Passed - Valid encounter within last 6 months    Recent Outpatient Visits           2 weeks ago Viral upper respiratory tract infection   [facility] Gareth Mliss FALCON, FNP   2 months ago Need for influenza vaccination   [facility] Gareth Mliss FALCON, FNP   8 months ago Essential hypertension   [facility] Gareth Mliss FALCON, FNP   9 months ago Shortness of breath   [facility] Gareth Mliss FALCON, [place]

## 2024-06-06 ENCOUNTER — Ambulatory Visit (INDEPENDENT_AMBULATORY_CARE_PROVIDER_SITE_OTHER)

## 2024-06-06 DIAGNOSIS — E538 Deficiency of other specified B group vitamins: Secondary | ICD-10-CM | POA: Diagnosis not present

## 2024-06-06 MED ORDER — CYANOCOBALAMIN 1000 MCG/ML IJ SOLN
1000.0000 ug | Freq: Once | INTRAMUSCULAR | Status: AC
  Administered 2024-06-06: 1000 ug via INTRAMUSCULAR

## 2024-06-09 ENCOUNTER — Encounter: Payer: Self-pay | Admitting: Student in an Organized Health Care Education/Training Program

## 2024-06-09 ENCOUNTER — Ambulatory Visit: Admitting: Student in an Organized Health Care Education/Training Program

## 2024-06-09 VITALS — BP 116/76 | HR 93 | Temp 98.1°F | Ht 62.0 in | Wt 190.8 lb

## 2024-06-09 DIAGNOSIS — R0609 Other forms of dyspnea: Secondary | ICD-10-CM

## 2024-06-09 DIAGNOSIS — R0602 Shortness of breath: Secondary | ICD-10-CM

## 2024-06-09 NOTE — Patient Instructions (Signed)
" °  VISIT SUMMARY: Today, we discussed your worsening shortness of breath, which has been progressively getting worse over the past year. We reviewed your symptoms and medical history, including your current medications and conditions.  YOUR PLAN: -CHRONIC PROGRESSIVE DYSPNEA: Chronic progressive dyspnea means ongoing and worsening shortness of breath. We need to determine if this is due to lung issues or other causes. We have ordered a pulmonary function test and a chest CT scan to get more information. We will review the results during your follow-up appointment in a couple of months.  INSTRUCTIONS: Please complete the pulmonary function test and chest CT scan as scheduled. We will discuss the results and next steps during your follow-up appointment in a couple of months.                      Contains text generated by Abridge.                                 Contains text generated by Abridge.   "

## 2024-06-09 NOTE — Progress Notes (Signed)
 "  Assessment & Plan  #Chronic progressive dyspnea  Patient presenting with worsening exertional dyspnea with no localizing symptoms or history to suggest a specific cause. She is a non-smoker and denies any occupational exposures. Cardiac evaluation previously unremarkable. Thyroid  function managed with methimazole and most recent repeat of her TSH was within normal.  Differential diagnosis is broad, and we will begin our investigation with PFT's to assess spirometry for obstruction, lung volumes for restriction, and DLCO for any impaired diffusion. Will also obtain a high resolution chest CT to assess lung parenchyma, airways, and mediastinal structures.  - Pulmonary Function Test; Future - CT CHEST HIGH RESOLUTION; Future   Return in about 2 months (around 08/07/2024).  Belva November, MD [facility]  I spent 60 minutes caring for this patient today, including preparing to see the patient, obtaining a medical history , reviewing a separately obtained history, performing a medically appropriate examination and/or evaluation, counseling and educating the patient/family/caregiver, ordering medications, tests, or procedures, documenting clinical information in the electronic health record, and independently interpreting results (not separately reported/billed) and communicating results to the patient/family/caregiver  End of visit medications:  No orders of the defined types were placed in this encounter.   Current Medications[1]   Subjective:   PATIENT ID: Patricia Richardson Simpler GENDER: female DOB: 12-Nov-1950, MRN: [id number]  Chief Complaint  Patient presents with   Consult    Shortness of breath on exertion and occasional at rest x 1 year. Cough and chest congestion x 2 weeks. Has completed Z-pak and prednisone . Using Albuterol  twice a day.     HPI  Discussed the use of AI scribe software for clinical note transcription with the patient, who gave verbal consent to  proceed.  History of Present Illness  Patricia Richardson is a 74 year old female with suspected Parkinson's disease who presents with worsening shortness of breath.  She has been experiencing significant shortness of breath for over a year, which has progressively worsened. She becomes breathless with minimal activity, such as walking through a grocery store or having a conversation, and often needs to sit down to catch her breath after walking through her house. She reports occasional cough and occasional wheezing, but does not bring up significant amounts of phlegm or mucus. No history of COPD or other chronic respiratory conditions.  She does not have a history of asthma, childhood asthma, or previous hospitalizations for breathing problems. Occasional wheezing is reported, but there is no coughing up of significant amounts of phlegm or mucus, and no hemoptysis is present.  She is being treated for possible Parkinson's disease and is currently taking carbidopa-levodopa four times a day.  She has a history of hyperthyroidism and is currently taking methimazole. No current significant leg swelling, rashes, or joint swelling is reported.  No significant weight changes have been experienced, although a possible weight gain of about ten pounds over the past year is noted. She denies any history of smoking or exposure to occupational hazards such as fumes or chemicals.   Ancillary information including prior medications, full medical/surgical/family/social histories, and PFTs (when available) are listed below and have been reviewed.   Review of Systems  Constitutional:  Negative for chills, fever, malaise/fatigue and weight loss.  Respiratory:  Positive for shortness of breath. Negative for cough, hemoptysis, sputum production and wheezing.   Cardiovascular:  Negative for chest pain.  Neurological:  Positive for tremors.     Objective:   Vitals:   06/09/24 1503  BP:  116/76  Pulse: 93  Temp:  98.1 F (36.7 C)  TempSrc: Temporal  SpO2: 96%  Weight: 190 lb 12.8 oz (86.5 kg)  Height: 5' 2 (1.575 m)   96% on RA  BMI Readings from Last 3 Encounters:  06/09/24 34.90 kg/m  05/15/24 34.39 kg/m  03/26/24 34.57 kg/m   Wt Readings from Last 3 Encounters:  06/09/24 190 lb 12.8 oz (86.5 kg)  05/15/24 188 lb (85.3 kg)  03/26/24 189 lb (85.7 kg)    Physical Exam Constitutional:      Appearance: Normal appearance.  Cardiovascular:     Rate and Rhythm: Normal rate and regular rhythm.     Pulses: Normal pulses.     Heart sounds: Normal heart sounds.  Pulmonary:     Effort: Pulmonary effort is normal.     Breath sounds: Normal breath sounds. No wheezing or rales.  Neurological:     General: No focal deficit present.     Mental Status: She is alert and oriented to person, place, and time. Mental status is at baseline.     Gait: Gait abnormal.     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   Anxiety    Arthritis    Cystocele    Depression    Essential hypertension, benign 06/17/2018   GERD (gastroesophageal reflux disease)    Glaucoma    History of chicken pox    Hypertension    Left pontine CVA (HCC) 10/2001     Family History  Problem Relation Age of Onset   Kidney disease Mother    Diabetes Mother    Drug abuse Mother    Heart attack Father    Hypertension Father    Kidney disease Father    Diabetes Paternal Grandmother    Diabetes Brother      Past Surgical History:  Procedure Laterality Date   ABDOMINAL HYSTERECTOMY     BREAST BIOPSY Right 11/03/2005   core biopsy w/ clip placement   CATARACT EXTRACTION W/PHACO Right 09/15/2020   Procedure: CATARACT EXTRACTION PHACO AND INTRAOCULAR LENS PLACEMENT (IOC) RIGHT;  Surgeon: Mittie Gaskin, MD;  Location: [facility];  Service: Ophthalmology;  Laterality: Right;  4.70 0:57.4 8.2%   CATARACT  EXTRACTION W/PHACO Left 09/29/2020   Procedure: CATARACT EXTRACTION PHACO AND INTRAOCULAR LENS PLACEMENT (IOC) LEFT 3.01 00:45.7 6.6%;  Surgeon: Mittie Gaskin, MD;  Location: [facility];  Service: Ophthalmology;  Laterality: Left;   CHOLECYSTECTOMY     COLONOSCOPY     COLONOSCOPY N/A 10/11/2023   Procedure: COLONOSCOPY;  Surgeon: Onita Elspeth Sharper, DO;  Location: [facility];  Service: Gastroenterology;  Laterality: N/A;   COLONOSCOPY WITH PROPOFOL  N/A 11/05/2015   Procedure: COLONOSCOPY WITH PROPOFOL ;  Surgeon: Gladis RAYMOND Mariner, MD;  Location: [facility];  Service: Endoscopy;  Laterality: N/A;   COLONOSCOPY WITH PROPOFOL  N/A 04/21/2022   Procedure: COLONOSCOPY WITH PROPOFOL ;  Surgeon: Onita Elspeth Sharper, DO;  Location: [facility] END[facility];  Service: Gastroenterology;  Laterality: N/A;   ESOPHAGOGASTRODUODENOSCOPY (EGD) WITH PROPOFOL  N/A 04/21/2022   Procedure: ESOPHAGOGASTRODUODENOSCOPY (EGD) WITH PROPOFOL ;  Surgeon: Onita Elspeth Sharper, DO;  Location: [facility];  Service: Gastroenterology;  Laterality: N/A;   JOINT REPLACEMENT  04-09-2018   KNEE ARTHROSCOPY Right 02/21/2008   POLYPECTOMY  10/11/2023   Procedure: POLYPECTOMY, INTESTINE;  Surgeon: Onita Elspeth Sharper, DO;  Location: [facility] END[facility];  Service: Gastroenterology;;   TOTAL HIP ARTHROPLASTY Right  04/09/2018   Procedure: TOTAL HIP ARTHROPLASTY ANTERIOR APPROACH;  Surgeon: Kathlynn Sharper, MD;  Location: [facility] ORS;  Service: Orthopedics;  Laterality: Right;   WISDOM TOOTH EXTRACTION      Social History   Socioeconomic History   Marital status: Divorced    Spouse name: Not on file   Number of children: 2   Years of education: 12   Highest education level: 12th grade  Occupational History   Not on file  Tobacco Use   Smoking status: Never   Smokeless tobacco: Never  Vaping Use   Vaping status: Never Used  Substance and Sexual Activity   Alcohol use: Yes    Comment: rarely   Drug use: No    Sexual activity: Not Currently  Other Topics Concern   Not on file  Social History Narrative   Pt lives alone   Social Drivers of Health   Tobacco Use: Low Risk (06/09/2024)   Patient History    Smoking Tobacco Use: Never    Smokeless Tobacco Use: Never    Passive Exposure: Not on file  Financial Resource Strain: Low Risk (03/22/2024)   Overall Financial Resource Strain (CARDIA)    Difficulty of Paying Living Expenses: Not very hard  Food Insecurity: No Food Insecurity (04/17/2024)   Epic    Worried About Programme Researcher, Broadcasting/film/video in the Last Year: Never true    Ran Out of Food in the Last Year: Never true  Transportation Needs: No Transportation Needs (04/17/2024)   Epic    Lack of Transportation (Medical): No    Lack of Transportation (Non-Medical): No  Physical Activity: Insufficiently Active (04/17/2024)   Exercise Vital Sign    Days of Exercise per Week: 2 days    Minutes of Exercise per Session: 60 min  Stress: No Stress Concern Present (04/17/2024)   [employer] of Occupational Health - Occupational Stress Questionnaire    Feeling of Stress: Not at all  Social Connections: Moderately Isolated (04/17/2024)   Social Connection and Isolation Panel    Frequency of Communication with Friends and Family: More than three times a week    Frequency of Social Gatherings with Friends and Family: Twice a week    Attends Religious Services: More than 4 times per year    Active Member of Clubs or Organizations: No    Attends Banker Meetings: Never    Marital Status: Divorced  Catering Manager Violence: Not At Risk (04/17/2024)   Epic    Fear of Current or Ex-Partner: No    Emotionally Abused: No    Physically Abused: No    Sexually Abused: No  Depression (PHQ2-9): Low Risk (04/17/2024)   Depression (PHQ2-9)    PHQ-2 Score: 0  Alcohol Screen: Low Risk (03/26/2024)   Alcohol Screen    Last Alcohol Screening Score (AUDIT): 0  Housing: Unknown (04/17/2024)   Epic     Unable to Pay for Housing in the Last Year: No    Number of Times Moved in the Last Year: Not on file    Homeless in the Last Year: No  Utilities: Not At Risk (04/17/2024)   Epic    Threatened with loss of utilities: No  Health Literacy: Adequate Health Literacy (04/17/2024)   B1300 Health Literacy    Frequency of need for help with medical instructions: Never     Allergies[2]   CBC    Component Value Date/Time   WBC 6.2 03/26/2024 1006   RBC 5.12 (H) 03/26/2024 1006  HGB 15.5 03/26/2024 1006   HCT 46.6 (H) 03/26/2024 1006   PLT 339 03/26/2024 1006   MCV 91.0 03/26/2024 1006   MCH 30.3 03/26/2024 1006   MCHC 33.3 03/26/2024 1006   RDW 12.5 03/26/2024 1006   LYMPHSABS 1,382 01/24/2023 1112   EOSABS 99 03/26/2024 1006   BASOSABS 31 03/26/2024 1006    Pulmonary Functions Testing Results:     No data to display          Outpatient Medications Prior to Visit  Medication Sig Dispense Refill   albuterol  (VENTOLIN  HFA) 108 (90 Base) MCG/ACT inhaler Inhale 2 puffs into the lungs every 6 (six) hours as needed for wheezing or shortness of breath. 8 g 0   aspirin  EC 81 MG tablet Take 1 tablet (81 mg total) by mouth daily. 90 tablet 3   carbidopa-levodopa (SINEMET IR) 25-100 MG tablet Take 1 tablet by mouth 4 (four) times daily.     escitalopram  (LEXAPRO ) 20 MG tablet Take 20 mg by mouth at bedtime.     gabapentin  (NEURONTIN ) 400 MG capsule Take 400 mg by mouth 3 (three) times daily.     hydrochlorothiazide  (HYDRODIURIL ) 25 MG tablet Take 1 tablet (25 mg total) by mouth daily. 100 tablet 0   meloxicam  (MOBIC ) 15 MG tablet Take 15 mg by mouth daily.     methimazole (TAPAZOLE) 5 MG tablet Take 2.5 mg by mouth daily.     omeprazole  (PRILOSEC) 40 MG capsule Take 40 mg by mouth daily.     OVER THE COUNTER MEDICATION Take 1 tablet by mouth daily as needed (constipation). Swiss Kriss herbal laxative     escitalopram  (LEXAPRO ) 10 MG tablet Take 10 mg by mouth at bedtime. 2 at night      methimazole (TAPAZOLE) 5 MG tablet Take by mouth. (Patient taking differently: Take 2.5 mg by mouth daily.)     omeprazole  (PRILOSEC) 20 MG capsule TAKE 1 CAPSULE BY MOUTH EVERY  OTHER DAY 50 capsule 2   benzonatate  (TESSALON ) 100 MG capsule Take 1 capsule (100 mg total) by mouth 3 (three) times daily as needed for cough. (Patient not taking: Reported on 06/09/2024) 30 capsule 0   predniSONE  (STERAPRED UNI-PAK 21 TAB) 10 MG (21) TBPK tablet Use as directed. (Patient not taking: Reported on 06/09/2024) 21 each 0   No facility-administered medications prior to visit.      [1]  Current Outpatient Medications:    albuterol  (VENTOLIN  HFA) 108 (90 Base) MCG/ACT inhaler, Inhale 2 puffs into the lungs every 6 (six) hours as needed for wheezing or shortness of breath., Disp: 8 g, Rfl: 0   aspirin  EC 81 MG tablet, Take 1 tablet (81 mg total) by mouth daily., Disp: 90 tablet, Rfl: 3   carbidopa-levodopa (SINEMET IR) 25-100 MG tablet, Take 1 tablet by mouth 4 (four) times daily., Disp: , Rfl:    escitalopram  (LEXAPRO ) 20 MG tablet, Take 20 mg by mouth at bedtime., Disp: , Rfl:    gabapentin  (NEURONTIN ) 400 MG capsule, Take 400 mg by mouth 3 (three) times daily., Disp: , Rfl:    hydrochlorothiazide  (HYDRODIURIL ) 25 MG tablet, Take 1 tablet (25 mg total) by mouth daily., Disp: 100 tablet, Rfl: 0   meloxicam  (MOBIC ) 15 MG tablet, Take 15 mg by mouth daily., Disp: , Rfl:    methimazole (TAPAZOLE) 5 MG tablet, Take 2.5 mg by mouth daily., Disp: , Rfl:    omeprazole  (PRILOSEC) 40 MG capsule, Take 40 mg by mouth daily., Disp: , Rfl:  OVER THE COUNTER MEDICATION, Take 1 tablet by mouth daily as needed (constipation). Swiss Kriss herbal laxative, Disp: , Rfl:  [2]  Allergies Allergen Reactions   Codeine Itching    Pin and needles    "

## 2024-06-11 ENCOUNTER — Other Ambulatory Visit: Payer: Self-pay | Admitting: Nurse Practitioner

## 2024-06-11 DIAGNOSIS — J069 Acute upper respiratory infection, unspecified: Secondary | ICD-10-CM

## 2024-06-11 DIAGNOSIS — R062 Wheezing: Secondary | ICD-10-CM

## 2024-06-11 DIAGNOSIS — J0140 Acute pansinusitis, unspecified: Secondary | ICD-10-CM

## 2024-06-12 NOTE — Telephone Encounter (Signed)
 Requested Prescriptions  Pending Prescriptions Disp Refills   albuterol  (VENTOLIN  HFA) 108 (90 Base) MCG/ACT inhaler [Pharmacy Med Name: ALBUTEROL  HFA (PROAIR ) INHALER] 8.5 each 0    Sig: TAKE 2 PUFFS BY MOUTH EVERY 6 HOURS AS NEEDED FOR WHEEZE OR SHORTNESS OF BREATH     Pulmonology:  Beta Agonists 2 Passed - 06/12/2024 11:54 AM      Passed - Last BP in normal range    BP Readings from Last 1 Encounters:  06/09/24 116/76         Passed - Last Heart Rate in normal range    Pulse Readings from Last 1 Encounters:  06/09/24 93         Passed - Valid encounter within last 12 months    Recent Outpatient Visits           4 weeks ago Viral upper respiratory tract infection   [facility] Gareth Mliss [place], FNP   2 months ago Need for influenza vaccination   [facility] Gareth Mliss [place], FNP   8 months ago Essential hypertension   [facility] Gareth Mliss [place]AL[place], FNP   9 months ago Shortness of breath   [facility] Gareth Mliss [place], [place]

## 2024-07-08 ENCOUNTER — Ambulatory Visit

## 2024-07-30 ENCOUNTER — Ambulatory Visit: Admitting: Nurse Practitioner

## 2024-08-05 ENCOUNTER — Other Ambulatory Visit

## 2024-10-01 ENCOUNTER — Ambulatory Visit: Admitting: Student in an Organized Health Care Education/Training Program

## 2024-10-01 ENCOUNTER — Encounter

## 2025-04-23 ENCOUNTER — Ambulatory Visit
